# Patient Record
Sex: Female | Born: 1960 | Race: White | Hispanic: No | Smoking: Former smoker
Health system: Southern US, Community
[De-identification: ages and names within clinical notes are randomized; demographics above are authoritative.]

## PROBLEM LIST (undated history)

## (undated) DIAGNOSIS — F1020 Alcohol dependence, uncomplicated: Secondary | ICD-10-CM

## (undated) DIAGNOSIS — R350 Frequency of micturition: Secondary | ICD-10-CM

## (undated) DIAGNOSIS — N200 Calculus of kidney: Secondary | ICD-10-CM

## (undated) DIAGNOSIS — M51369 Other intervertebral disc degeneration, lumbar region without mention of lumbar back pain or lower extremity pain: Secondary | ICD-10-CM

## (undated) DIAGNOSIS — F329 Major depressive disorder, single episode, unspecified: Secondary | ICD-10-CM

## (undated) DIAGNOSIS — F4310 Post-traumatic stress disorder, unspecified: Secondary | ICD-10-CM

## (undated) DIAGNOSIS — R3915 Urgency of urination: Secondary | ICD-10-CM

## (undated) DIAGNOSIS — I10 Essential (primary) hypertension: Secondary | ICD-10-CM

## (undated) DIAGNOSIS — F419 Anxiety disorder, unspecified: Secondary | ICD-10-CM

## (undated) DIAGNOSIS — K219 Gastro-esophageal reflux disease without esophagitis: Secondary | ICD-10-CM

## (undated) DIAGNOSIS — N201 Calculus of ureter: Secondary | ICD-10-CM

## (undated) DIAGNOSIS — R51 Headache: Secondary | ICD-10-CM

## (undated) HISTORY — PX: OTHER SURGICAL HISTORY: SHX169

---

## 1995-12-06 HISTORY — PX: TRANSPHENOIDAL / TRANSNASAL HYPOPHYSECTOMY / RESECTION PITUITARY TUMOR: SUR1382

## 1999-03-12 ENCOUNTER — Other Ambulatory Visit: Admission: RE | Admit: 1999-03-12 | Discharge: 1999-03-12 | Payer: Self-pay | Admitting: Obstetrics and Gynecology

## 2001-02-07 ENCOUNTER — Other Ambulatory Visit: Admission: RE | Admit: 2001-02-07 | Discharge: 2001-02-07 | Payer: Self-pay | Admitting: Obstetrics and Gynecology

## 2001-03-23 ENCOUNTER — Encounter: Payer: Self-pay | Admitting: Obstetrics and Gynecology

## 2001-03-23 ENCOUNTER — Encounter: Admission: RE | Admit: 2001-03-23 | Discharge: 2001-03-23 | Payer: Self-pay | Admitting: Obstetrics and Gynecology

## 2001-03-29 ENCOUNTER — Encounter: Payer: Self-pay | Admitting: Obstetrics and Gynecology

## 2001-03-29 ENCOUNTER — Encounter: Admission: RE | Admit: 2001-03-29 | Discharge: 2001-03-29 | Payer: Self-pay | Admitting: Obstetrics and Gynecology

## 2005-01-22 ENCOUNTER — Emergency Department (HOSPITAL_COMMUNITY): Admission: EM | Admit: 2005-01-22 | Discharge: 2005-01-22 | Payer: Self-pay | Admitting: Emergency Medicine

## 2005-01-31 ENCOUNTER — Encounter: Admission: RE | Admit: 2005-01-31 | Discharge: 2005-01-31 | Payer: Self-pay | Admitting: *Deleted

## 2006-12-01 ENCOUNTER — Ambulatory Visit: Payer: Self-pay | Admitting: Internal Medicine

## 2006-12-01 LAB — CONVERTED CEMR LAB
ALT: 37 units/L (ref 0–40)
AST: 33 units/L (ref 0–37)
Albumin: 4.1 g/dL (ref 3.5–5.2)
Alkaline Phosphatase: 45 units/L (ref 39–117)
BUN: 15 mg/dL (ref 6–23)
Basophils Absolute: 0 10*3/uL (ref 0.0–0.1)
Basophils Relative: 0.4 % (ref 0.0–1.0)
Bilirubin, Direct: 0.1 mg/dL (ref 0.0–0.3)
CO2: 25 meq/L (ref 19–32)
Calcium: 9.5 mg/dL (ref 8.4–10.5)
Chloride: 106 meq/L (ref 96–112)
Creatinine, Ser: 0.9 mg/dL (ref 0.4–1.2)
Eosinophil percent: 1.2 % (ref 0.0–5.0)
Free T4: 0.6 ng/dL (ref 0.6–1.6)
GFR calc non Af Amer: 72 mL/min
Glomerular Filtration Rate, Af Am: 87 mL/min/{1.73_m2}
Glucose, Bld: 93 mg/dL (ref 70–99)
H Pylori IgG: NEGATIVE
HCT: 41.4 % (ref 36.0–46.0)
Hemoglobin: 14.7 g/dL (ref 12.0–15.0)
Lymphocytes Relative: 26.6 % (ref 12.0–46.0)
MCHC: 35.6 g/dL (ref 30.0–36.0)
MCV: 95 fL (ref 78.0–100.0)
Monocytes Absolute: 0.6 10*3/uL (ref 0.2–0.7)
Monocytes Relative: 7.3 % (ref 3.0–11.0)
Neutro Abs: 4.9 10*3/uL (ref 1.4–7.7)
Neutrophils Relative %: 64.5 % (ref 43.0–77.0)
Platelets: 278 10*3/uL (ref 150–400)
Potassium: 4.3 meq/L (ref 3.5–5.1)
RBC: 4.36 M/uL (ref 3.87–5.11)
RDW: 12.1 % (ref 11.5–14.6)
Sodium: 140 meq/L (ref 135–145)
TSH: 1.38 microintl units/mL (ref 0.35–5.50)
Total Bilirubin: 0.7 mg/dL (ref 0.3–1.2)
Total Protein: 6.7 g/dL (ref 6.0–8.3)
WBC: 7.6 10*3/uL (ref 4.5–10.5)

## 2006-12-19 ENCOUNTER — Ambulatory Visit: Payer: Self-pay | Admitting: Internal Medicine

## 2006-12-20 ENCOUNTER — Encounter: Payer: Self-pay | Admitting: Internal Medicine

## 2007-03-16 ENCOUNTER — Encounter: Payer: Self-pay | Admitting: Internal Medicine

## 2007-12-20 ENCOUNTER — Ambulatory Visit: Payer: Self-pay | Admitting: Internal Medicine

## 2007-12-20 DIAGNOSIS — I10 Essential (primary) hypertension: Secondary | ICD-10-CM | POA: Insufficient documentation

## 2007-12-20 DIAGNOSIS — R519 Headache, unspecified: Secondary | ICD-10-CM | POA: Insufficient documentation

## 2007-12-20 DIAGNOSIS — G479 Sleep disorder, unspecified: Secondary | ICD-10-CM | POA: Insufficient documentation

## 2007-12-20 DIAGNOSIS — G43909 Migraine, unspecified, not intractable, without status migrainosus: Secondary | ICD-10-CM | POA: Insufficient documentation

## 2007-12-20 DIAGNOSIS — F411 Generalized anxiety disorder: Secondary | ICD-10-CM | POA: Insufficient documentation

## 2007-12-20 DIAGNOSIS — R51 Headache: Secondary | ICD-10-CM | POA: Insufficient documentation

## 2008-02-12 ENCOUNTER — Encounter: Admission: RE | Admit: 2008-02-12 | Discharge: 2008-02-12 | Payer: Self-pay | Admitting: Obstetrics and Gynecology

## 2008-06-23 ENCOUNTER — Telehealth: Payer: Self-pay | Admitting: Internal Medicine

## 2008-07-15 ENCOUNTER — Encounter: Payer: Self-pay | Admitting: Internal Medicine

## 2008-09-30 ENCOUNTER — Telehealth: Payer: Self-pay | Admitting: Internal Medicine

## 2008-10-14 ENCOUNTER — Telehealth: Payer: Self-pay | Admitting: *Deleted

## 2008-11-03 ENCOUNTER — Ambulatory Visit: Payer: Self-pay | Admitting: Internal Medicine

## 2008-11-03 DIAGNOSIS — K219 Gastro-esophageal reflux disease without esophagitis: Secondary | ICD-10-CM | POA: Insufficient documentation

## 2009-01-16 ENCOUNTER — Encounter: Payer: Self-pay | Admitting: Internal Medicine

## 2009-07-17 ENCOUNTER — Encounter: Payer: Self-pay | Admitting: Internal Medicine

## 2010-12-27 ENCOUNTER — Encounter: Payer: Self-pay | Admitting: Obstetrics and Gynecology

## 2011-01-27 ENCOUNTER — Other Ambulatory Visit: Payer: Self-pay | Admitting: Obstetrics and Gynecology

## 2011-01-27 DIAGNOSIS — Z1231 Encounter for screening mammogram for malignant neoplasm of breast: Secondary | ICD-10-CM

## 2011-02-07 ENCOUNTER — Ambulatory Visit
Admission: RE | Admit: 2011-02-07 | Discharge: 2011-02-07 | Disposition: A | Discharge status: Home / Self Care | Payer: Self-pay | Source: Ambulatory Visit | Attending: Obstetrics and Gynecology | Admitting: Obstetrics and Gynecology

## 2011-02-07 DIAGNOSIS — Z1231 Encounter for screening mammogram for malignant neoplasm of breast: Secondary | ICD-10-CM

## 2011-04-22 NOTE — Assessment & Plan Note (Signed)
[facility] OFFICE NOTE   Coleman, Jill                        MRN:          [id number]  DATE:12/01/2006                            DOB:          [birth year]    CHIEF COMPLAINT:  New patient work in. Fatigue, diarrhea, achiness, and  abdominal tenderness.   HISTORY OF PRESENT ILLNESS:  This is a 50 year old social smoker,  divorced female who comes in today for a first time visit. She has a  number of complaints including bloating abdominally and some achiness.  She has no associated fever or weight loss or blood in her stool. The  diarrhea has really been only over the last day. She currently does not  have a primary care physician, but does have Dr. 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 For those reasons, she is on Toprol XL and Dr.  Meryl Crutch has prescribed Ambien for sleep and __________ for migraine  headaches.   PAST MEDICAL HISTORY:  See database.   PAST SURGICAL HISTORY:  C-section, birth of her first child in 18. 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  She is gravida 2, para 2, last Pap was 10/2005. Last mammogram  was 11/02, L&P 11/03/2005. History of elevated blood pressure readings  with an evaluation by Dr. Abigail Miyamoto a number of years included,  apparently, a 24 hour ambulatory and blood pressure monitoring and renal  Dopplers which showed no causes of secondary hypertension and was felt  to be secondary to anxiety.   SOCIAL HISTORY:  As above, social smoker, social alcohol, 7 hours of  sleep a night, household of 2. 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 She is  divorced and is a single mom.   REVIEW OF SYSTEMS:  Negative for chest pain, shortness of breath,  positive as per HPI, sinus congestion, some  numbness in legs and hands,  history of bloating, abdominal tenderness, heartburn, 1 day of diarrhea  and ongoing fatigue which could be stress, but unsure. She has also has  a history of hot flashes over the last number of weeks.   FAMILY HISTORY:  See database. Apparent heart disease and high blood  pressure. Her mother had gall bladder surgery.   MEDICATIONS:  1. Toprol XL 200 mg.  2. __________  3. Ambien p.r.n.   DRUG ALLERGIES:  None recorded.   OBJECTIVE:  VITAL SIGNS: Height 5 feet 1/2 inches, weight 150 pounds,  temperature 98.2, pulse 66 and regular, blood pressure 120/80.  This is a WDWN, healthy appearing middle-aged lady in no acute distress.  HEENT: Normocephalic, exam grossly normal.  NECK: Supple, without masses, thyromegaly, or bruits.  CHEST: CTA, __________ , equal.  CARDIAC: S1, S2, no gallops or murmurs.  ABDOMEN: Soft, without ascites, no hepatomegaly, guarding or tenderness.  EXTREMITIES: No acute edema.  BACK: Without flank pain.  NEUROLOGIC: Grossly intact without obvious atrophy  or weakness.   IMPRESSION:  Numerous symptoms, probably perimenopausal symptoms,  possibly acute on chronic GI symptoms, question of acute  gastroenteritis. Rule out H. pylori. Multiple other symptoms may be  related to stress and anxiety, but we need to sort this out. Her  headaches, apparently, are stable. We will do labs today including CBC,  H. Pylori, UI, BNP, thyroid test and LFTs. We will try Proton pump  inhibitor and recheck her in 2-3 weeks before she runs out of her  medication and to try to get a better handle on what is going on.     Neta Mends. Fabian Sharp, MD  Electronically Signed    WKP/MedQ  DD: 12/03/2006  DT: 12/04/2006  Job #: [id number]

## 2012-01-25 ENCOUNTER — Other Ambulatory Visit: Payer: Self-pay | Admitting: Obstetrics and Gynecology

## 2012-01-25 DIAGNOSIS — Z1231 Encounter for screening mammogram for malignant neoplasm of breast: Secondary | ICD-10-CM

## 2012-02-16 ENCOUNTER — Ambulatory Visit: Payer: 59

## 2013-05-29 ENCOUNTER — Other Ambulatory Visit: Payer: Self-pay

## 2013-05-29 DIAGNOSIS — Z1231 Encounter for screening mammogram for malignant neoplasm of breast: Secondary | ICD-10-CM

## 2013-06-18 ENCOUNTER — Ambulatory Visit: Payer: 59

## 2013-10-25 ENCOUNTER — Ambulatory Visit (HOSPITAL_COMMUNITY)
Admission: RE | Admit: 2013-10-25 | Discharge: 2013-10-25 | Disposition: A | Discharge status: Home / Self Care | Payer: 59 | Attending: Psychiatry | Admitting: Psychiatry

## 2013-10-25 NOTE — BH Assessment (Signed)
Tele Assessment Note   [name] is an 52 y.o. female. Patient referred by current psychiatrist, Dr. Evelene Croon, for referral to a psychiatrist while Dr. Evelene Croon is on vacation.  Patient states Dr. Evelene Croon to return on December 1st.  Per patient Dr. Carie Caddy office referred pt to [facility] if patient wants to see a psychiatrist for medication evaluation.  Patient on FMLA and says needs proof of treatment for her FMLA claims.  Patient needs to see a psychiatrist for medication evaluation for claims.   Patient states she has a lot of stress at work by her Agricultural consultant. This person has a history of harassing other employees. Patient is on intermittent FMLA to care for her parents. When she returns to work she is subjected to negative comments in front of others about her work and absences by her Agricultural consultant. Patient has filed a complaint with her HR department and is having a meeting today to provide more information regarding her complaint. She says her project leader's behavior increases her stress level.  Patient states multiple problems with her parents over the past year. Father is an alcoholic that has relapsed and has dementia as well as other medical problems. She receives calls at work frequently from one parent or the other. Parents live in [place] and she is back and forth a lot to help manage the situation.  Her other siblings are not helpful and she feels the burden of her parents.   Patient states her work is piling up with her absences and she feels guilty not pulling her weight at work. However, patient states when she goes to work the Chiropodist from her project leader increases her stress and disrupts her productivity.  She complains of decreased concentration and felling overwhelmed.    Patient admits to depressive symptoms and has been tried on several antidepressants. Patient says she experiences too many side effects so she does not stay on any medication. She takes [place] and  klonopin PRN. She sees Dr. Evelene Croon and has been her patient for many years.  Patient says project leader was exceptionally harassing on Tuesday at work and she had a thought she wished she was not alive so as not to deal with him anymore. Patient denies suicidal or homicidal ideation. Denies psychosis.  Patient says she feels safe to return home and is only seeking an appointment with a psychiatrist for her FMLA claims purposes. Patient says she would never commit suicide because of her children and her sense of responsibility. Patient denies need for inpatient hospitalization, just requests an appointment with a psychiatrist.   Reviewed case with Verne Spurr, PA. Patient does not meet criteria for inpatient hospitalization. [facility] does not have any openings with a psychiatrist until January. Gave patient referrals to outpatient psychiatrists. Reviewed suicide prevention information and let patient know she could return at any time if she began to feel worse. Reviewed intensive outpatient services if symptoms worsened. Patient continues to deny need for inpatient or IOP services. Patient denied medical screening exam and signed refusal form. Patient thanked Clinical research associate for referrals and left with a friend.     Axis I: Adjustment Disorder with Depressed Mood Axis II: Deferred Axis III: No past medical history on file. Axis IV: occupational problems and problems with primary support group Axis V: 61-70 mild symptoms  Past Medical History: No past medical history on file.  No past surgical history on file.  Family History: No family history on file.  Social History:  has no tobacco, alcohol, and drug history on file.  Additional Social History:  Alcohol / Drug Use Pain Medications:  (denies ) Prescriptions:  (takes as prescibed) Over the Counter:  (denies) History of alcohol / drug use?: No history of alcohol / drug abuse  CIWA:   COWS:    Allergies: Allergies no  known allergies  Home Medications:  (Not in a hospital admission)  OB/GYN Status:  No LMP recorded.  General Assessment Data Location of Assessment: [facility] Is this a Tele or Face-to-Face Assessment?: Face-to-Face Is this an Initial Assessment or a Re-assessment for this encounter?: Initial Assessment Living Arrangements: Alone Can pt return to current living arrangement?: Yes Admission Status: Voluntary Is patient capable of signing voluntary admission?: Yes Transfer from: Home Referral Source: Psychiatrist  Medical Screening Exam [facility] Walk-in ONLY) Medical Exam completed: No Reason for MSE not completed: Patient Refused  [facility] Crisis Care Plan Living Arrangements: Alone  Education Status Is patient currently in school?: No  Risk to self Suicidal Ideation: No Suicidal Intent: No Is patient at risk for suicide?: No Suicidal Plan?: No Access to Means: No What has been your use of drugs/alcohol within the last 12 months?:  (1-2 glasses of wine, several times per week) Previous Attempts/Gestures: No How many times?: 0 Other Self Harm Risks: denies Triggers for Past Attempts:  (n/a) Intentional Self Injurious Behavior: None Family Suicide History: No Recent stressful life event(s): Other (Comment) (family and work stress) Persecutory voices/beliefs?: No Depression: Yes Depression Symptoms: Insomnia;Tearfulness;Isolating;Fatigue;Guilt;Loss of interest in usual pleasures Substance abuse history and/or treatment for substance abuse?: No Suicide prevention information given to non-admitted patients: Not applicable  Risk to Others Homicidal Ideation: No Thoughts of Harm to Others: No Current Homicidal Intent: No Current Homicidal Plan: No Access to Homicidal Means: No History of harm to others?: No Assessment of Violence: None Noted Does patient have access to weapons?: No Criminal Charges Pending?: No Does patient have a court date:  No  Psychosis Hallucinations: None noted Delusions: None noted  Mental Status Report Appear/Hygiene: Other (Comment) (unremarkable) Eye Contact: Good Motor Activity: Unremarkable Speech: Logical/coherent Level of Consciousness: Alert Mood: Angry Affect: Irritable Anxiety Level: Minimal Thought Processes: Coherent;Relevant Judgement: Unimpaired Orientation: Person;Place;Time;Situation Obsessive Compulsive Thoughts/Behaviors: None  Cognitive Functioning Concentration: Decreased Memory: Recent Intact;Remote Intact IQ: Average Insight: Good Impulse Control: Good Appetite: Fair Weight Loss: 0 Weight Gain:  (yes but unsure how much) Sleep: Increased Total Hours of Sleep: 7 Vegetative Symptoms: Staying in bed  ADLScreening [facility] Assessment Services) Patient's cognitive ability adequate to safely complete daily activities?: Yes Patient able to express need for assistance with ADLs?: Yes Independently performs ADLs?: Yes (appropriate for developmental age)  Prior Inpatient Therapy Prior Inpatient Therapy: No  Prior Outpatient Therapy Prior Outpatient Therapy: Yes Prior Therapy Dates: ongoing Prior Therapy Facilty/Provider(s): Dr. Evelene Croon Reason for Treatment: overwhelmed with work and issues with parents  ADL Screening (condition at time of admission) Patient's cognitive ability adequate to safely complete daily activities?: Yes Is the patient deaf or have difficulty hearing?: No Does the patient have difficulty seeing, even when wearing glasses/contacts?: No Does the patient have difficulty concentrating, remembering, or making decisions?: No Patient able to express need for assistance with ADLs?: Yes Does the patient have difficulty dressing or bathing?: No Independently performs ADLs?: Yes (appropriate for developmental age) Does the patient have difficulty walking or climbing stairs?: No Weakness of Legs: None Weakness of Arms/Hands: None  Home Assistive  Devices/Equipment Home Assistive Devices/Equipment: None  Abuse/Neglect Assessment (Assessment to be complete while patient is alone) Physical Abuse: Denies Verbal Abuse: Denies Sexual Abuse: Denies Exploitation of patient/patient's resources: Denies Self-Neglect: Denies     Merchant navy officer (For Healthcare) Advance Directive: Patient does not have advance directive;Patient would not like information Nutrition Screen- [facility] Adult/WL/AP Patient's home diet: Regular  Additional Information 1:1 In Past 12 Months?: No CIRT Risk: No Elopement Risk: No Does patient have medical clearance?:  (n/a)     Disposition:  Disposition Initial Assessment Completed for this Encounter: Yes Disposition of Patient: Outpatient treatment Type of outpatient treatment: Adult  Yates Decamp 10/25/2013 2:15 PM

## 2014-03-11 ENCOUNTER — Other Ambulatory Visit: Payer: Self-pay | Admitting: Obstetrics and Gynecology

## 2014-03-11 ENCOUNTER — Encounter (HOSPITAL_COMMUNITY): Payer: Self-pay | Admitting: Pharmacist

## 2014-03-12 ENCOUNTER — Encounter (HOSPITAL_COMMUNITY): Payer: Self-pay

## 2014-03-12 ENCOUNTER — Encounter (HOSPITAL_COMMUNITY)
Admission: RE | Admit: 2014-03-12 | Discharge: 2014-03-12 | Disposition: A | Discharge status: Home / Self Care | Payer: 59 | Source: Ambulatory Visit | Attending: Obstetrics and Gynecology | Admitting: Obstetrics and Gynecology

## 2014-03-12 DIAGNOSIS — Z01810 Encounter for preprocedural cardiovascular examination: Secondary | ICD-10-CM | POA: Insufficient documentation

## 2014-03-12 DIAGNOSIS — Z01812 Encounter for preprocedural laboratory examination: Secondary | ICD-10-CM | POA: Insufficient documentation

## 2014-03-12 HISTORY — DX: Essential (primary) hypertension: I10

## 2014-03-12 HISTORY — DX: Anxiety disorder, unspecified: F41.9

## 2014-03-12 HISTORY — DX: Headache: R51

## 2014-03-12 LAB — CBC
HCT: 35.9 % — ABNORMAL LOW (ref 36.0–46.0)
Hemoglobin: 12 g/dL (ref 12.0–15.0)
MCH: 31.3 pg (ref 26.0–34.0)
MCHC: 33.4 g/dL (ref 30.0–36.0)
MCV: 93.7 fL (ref 78.0–100.0)
PLATELETS: 260 10*3/uL (ref 150–400)
RBC: 3.83 MIL/uL — ABNORMAL LOW (ref 3.87–5.11)
RDW: 14.3 % (ref 11.5–15.5)
WBC: 6.6 10*3/uL (ref 4.0–10.5)

## 2014-03-12 LAB — BASIC METABOLIC PANEL
BUN: 10 mg/dL (ref 6–23)
CALCIUM: 9.7 mg/dL (ref 8.4–10.5)
CO2: 25 mEq/L (ref 19–32)
CREATININE: 0.73 mg/dL (ref 0.50–1.10)
Chloride: 99 mEq/L (ref 96–112)
GFR calc non Af Amer: >90 mL/min (ref >90)
Glucose, Bld: 137 mg/dL — ABNORMAL HIGH (ref 70–99)
Potassium: 4 mEq/L (ref 3.7–5.3)
Sodium: 136 mEq/L — ABNORMAL LOW (ref 137–147)

## 2014-03-12 NOTE — Patient Instructions (Signed)
[place]  03/12/2014   Your procedure is scheduled on:  03/19/14  Enter through the Main Entrance of [facility] at [place] up the phone at the desk and dial [phone].   Call this number if you have problems the morning of surgery: [phone]   Remember:   Do not eat food:After Midnight.  Do not drink clear liquids: After Midnight.  Take these medicines the morning of surgery with A SIP OF WATER: NA   Do not wear jewelry, make-up or nail polish.  Do not wear lotions, powders, or perfumes. You may wear deodorant.  Do not shave 48 hours prior to surgery.  Do not bring valuables to the hospital.  [facility] is not   responsible for any belongings or valuables brought to the hospital.  Contacts, dentures or bridgework may not be worn into surgery.  Leave suitcase in the car. After surgery it may be brought to your room.  For patients admitted to the hospital, checkout time is 11:00 AM the day of              discharge.   Patients discharged the day of surgery will not be allowed to drive             home.  Name and phone number of your driver: NA  Special Instructions:      Please read over the following fact sheets that you were given:   Surgical Site Infection Prevention

## 2014-03-18 ENCOUNTER — Encounter (HOSPITAL_COMMUNITY): Payer: Self-pay | Admitting: Anesthesiology

## 2014-03-18 NOTE — H&P (Signed)
[name], [name]                 ACCOUNT NO.:  [id number]  MEDICAL RECORD NO.:  [id number]  LOCATION:  PERIO                         FACILITY:  [facility]  PHYSICIAN:  Lovenia Kim, M.D.DATE OF BIRTH:  1960-12-27  DATE OF ADMISSION:  02/14/2014 DATE OF DISCHARGE:                             HISTORY & PHYSICAL   CHIEF COMPLAINT:  Symptomatic fibroids with dysfunctional uterine bleeding and dysmenorrhea.  HISTORY OF PRESENT ILLNESS:  A 53 year old white female G2, P2, who presents with symptomatic fibroids for surgical therapy.  MEDICATIONS:  To include Vicodin as needed, Klonopin, Relpax, Ambien, and ibuprofen.  PAST SURGICAL HISTORY:  Remarkable for pilonidal cyst removal.  PREGNANCY HISTORY:  Two vaginal deliveries.  FAMILY HISTORY:  Hypertension, heart disease, diabetes.  PHYSICAL EXAMINATION:  GENERAL:  She is a well-developed, well- nourished, white female, in no acute distress.  HEENT:  Normal. NECK:  Supple.  Full range of motion. LUNGS:  Clear. HEART:  Regular rate and rhythm. ABDOMEN:  Soft, nontender. PELVIC:  There is a bulky anteflexed uterus and no adnexal masses. EXTREMITIES:  There are no cords. NEUROLOGIC:  Nonfocal. SKIN:  Intact.  IMPRESSION:  Symptomatic dysmenorrhea and menorrhagia with large submucous uterine fibroids for definitive therapy.  PLAN:  Proceed with da Vinci assisted total laparoscopic hysterectomy, bilateral salpingectomy.  Risks of anesthesia, infection, bleeding, injury to surrounding organs with possible need for repair was discussed.  Delayed versus immediate complications to include bowel and bladder injury noted.  The patient acknowledges and wishes to proceed.     Lovenia Kim, M.D.     RJT/MEDQ  D:  03/18/2014  T:  03/18/2014  Job:  [id number]

## 2014-03-18 NOTE — Anesthesia Preprocedure Evaluation (Addendum)
Anesthesia Evaluation  Patient identified by MRN, date of birth, ID band Patient awake    Reviewed: Allergy & Precautions, H&P , NPO status , Patient's Chart, lab work & pertinent test results, reviewed documented beta blocker date and time   History of Anesthesia Complications Negative for: history of anesthetic complications  Airway Mallampati: II TM Distance: >3 FB Neck ROM: full    Dental  (+) Teeth Intact   Pulmonary Current Smoker,  breath sounds clear to auscultation  Pulmonary exam normal       Cardiovascular Exercise Tolerance: Good hypertension (no meds), Rhythm:regular Rate:Normal     Neuro/Psych  Headaches (5x/month), Anxiety    GI/Hepatic Neg liver ROS, GERD-  Medicated and Controlled,  Endo/Other  Obesity  Renal/GU negative Renal ROS  Female GU complaint (Uterine Fibroids)     Musculoskeletal negative musculoskeletal ROS (+)   Abdominal   Peds  Hematology negative hematology ROS (+)   Anesthesia Other Findings   Reproductive/Obstetrics negative OB ROS                          Anesthesia Physical Anesthesia Plan  ASA: II  Anesthesia Plan: General ETT   Post-op Pain Management:    Induction: Intravenous  Airway Management Planned: Oral ETT  Additional Equipment:   Intra-op Plan:   Post-operative Plan: Extubation in OR  Informed Consent: I have reviewed the patients History and Physical, chart, labs and discussed the procedure including the risks, benefits and alternatives for the proposed anesthesia with the patient or authorized representative who has indicated his/her understanding and acceptance.   Dental advisory given  Plan Discussed with: CRNA, Anesthesiologist and Surgeon  Anesthesia Plan Comments:        Anesthesia Quick Evaluation

## 2014-03-19 ENCOUNTER — Encounter (HOSPITAL_COMMUNITY)
Admission: RE | Disposition: A | Discharge status: Home / Self Care | Payer: Self-pay | Source: Ambulatory Visit | Attending: Obstetrics and Gynecology

## 2014-03-19 ENCOUNTER — Encounter (HOSPITAL_COMMUNITY): Payer: Self-pay | Admitting: Anesthesiology

## 2014-03-19 ENCOUNTER — Ambulatory Visit (HOSPITAL_COMMUNITY)
Admission: RE | Admit: 2014-03-19 | Discharge: 2014-03-20 | Disposition: A | Discharge status: Home / Self Care | Payer: 59 | Source: Ambulatory Visit | Attending: Obstetrics and Gynecology | Admitting: Obstetrics and Gynecology

## 2014-03-19 ENCOUNTER — Encounter (HOSPITAL_COMMUNITY): Payer: 59 | Admitting: Anesthesiology

## 2014-03-19 ENCOUNTER — Ambulatory Visit (HOSPITAL_COMMUNITY): Payer: 59 | Admitting: Anesthesiology

## 2014-03-19 DIAGNOSIS — N946 Dysmenorrhea, unspecified: Secondary | ICD-10-CM | POA: Diagnosis present

## 2014-03-19 DIAGNOSIS — N949 Unspecified condition associated with female genital organs and menstrual cycle: Secondary | ICD-10-CM | POA: Insufficient documentation

## 2014-03-19 DIAGNOSIS — N815 Vaginal enterocele: Secondary | ICD-10-CM | POA: Insufficient documentation

## 2014-03-19 DIAGNOSIS — N920 Excessive and frequent menstruation with regular cycle: Secondary | ICD-10-CM | POA: Insufficient documentation

## 2014-03-19 DIAGNOSIS — D251 Intramural leiomyoma of uterus: Secondary | ICD-10-CM | POA: Insufficient documentation

## 2014-03-19 DIAGNOSIS — F411 Generalized anxiety disorder: Secondary | ICD-10-CM | POA: Insufficient documentation

## 2014-03-19 DIAGNOSIS — N938 Other specified abnormal uterine and vaginal bleeding: Secondary | ICD-10-CM | POA: Insufficient documentation

## 2014-03-19 DIAGNOSIS — N838 Other noninflammatory disorders of ovary, fallopian tube and broad ligament: Secondary | ICD-10-CM | POA: Insufficient documentation

## 2014-03-19 DIAGNOSIS — I10 Essential (primary) hypertension: Secondary | ICD-10-CM | POA: Insufficient documentation

## 2014-03-19 DIAGNOSIS — R51 Headache: Secondary | ICD-10-CM | POA: Insufficient documentation

## 2014-03-19 DIAGNOSIS — F17200 Nicotine dependence, unspecified, uncomplicated: Secondary | ICD-10-CM | POA: Insufficient documentation

## 2014-03-19 DIAGNOSIS — K219 Gastro-esophageal reflux disease without esophagitis: Secondary | ICD-10-CM | POA: Insufficient documentation

## 2014-03-19 HISTORY — PX: BILATERAL SALPINGECTOMY: SHX5743

## 2014-03-19 HISTORY — PX: ROBOTIC ASSISTED TOTAL HYSTERECTOMY: SHX6085

## 2014-03-19 LAB — HCG, SERUM, QUALITATIVE: Preg, Serum: NEGATIVE

## 2014-03-19 SURGERY — ROBOTIC ASSISTED TOTAL HYSTERECTOMY
Anesthesia: General | Site: Abdomen

## 2014-03-19 MED ORDER — ZOLPIDEM TARTRATE 5 MG PO TABS
5.0000 mg | ORAL_TABLET | Freq: Every evening | ORAL | Status: DC | PRN
  Administered 2014-03-19: 5 mg via ORAL
  Filled 2014-03-19: qty 1

## 2014-03-19 MED ORDER — DEXAMETHASONE SODIUM PHOSPHATE 10 MG/ML IJ SOLN
INTRAMUSCULAR | Status: DC | PRN
  Administered 2014-03-19: 10 mg via INTRAVENOUS

## 2014-03-19 MED ORDER — FENTANYL CITRATE 0.05 MG/ML IJ SOLN
INTRAMUSCULAR | Status: AC
  Administered 2014-03-19: 50 ug via INTRAVENOUS
  Filled 2014-03-19: qty 2

## 2014-03-19 MED ORDER — ROPIVACAINE HCL 5 MG/ML IJ SOLN
INTRAMUSCULAR | Status: AC
  Filled 2014-03-19: qty 60

## 2014-03-19 MED ORDER — MENTHOL 3 MG MT LOZG
1.0000 | LOZENGE | OROMUCOSAL | Status: DC | PRN
  Administered 2014-03-19: 3 mg via ORAL
  Filled 2014-03-19: qty 9

## 2014-03-19 MED ORDER — OXYCODONE-ACETAMINOPHEN 5-325 MG PO TABS
1.0000 | ORAL_TABLET | ORAL | Status: DC | PRN
  Administered 2014-03-19 – 2014-03-20 (×6): 1 via ORAL
  Filled 2014-03-19 (×6): qty 1

## 2014-03-19 MED ORDER — MIDAZOLAM HCL 2 MG/2ML IJ SOLN
INTRAMUSCULAR | Status: AC
  Filled 2014-03-19: qty 2

## 2014-03-19 MED ORDER — ARTIFICIAL TEARS OP OINT
TOPICAL_OINTMENT | OPHTHALMIC | Status: DC | PRN
  Administered 2014-03-19: 1 via OPHTHALMIC

## 2014-03-19 MED ORDER — LACTATED RINGERS IV SOLN
INTRAVENOUS | Status: DC
Start: 2014-03-19 — End: 2014-03-19
  Administered 2014-03-19 (×4): via INTRAVENOUS

## 2014-03-19 MED ORDER — PROPOFOL 10 MG/ML IV EMUL
INTRAVENOUS | Status: AC
  Filled 2014-03-19: qty 20

## 2014-03-19 MED ORDER — ROPIVACAINE HCL 5 MG/ML IJ SOLN
INTRAMUSCULAR | Status: AC
  Filled 2014-03-19: qty 30

## 2014-03-19 MED ORDER — KETOROLAC TROMETHAMINE 30 MG/ML IJ SOLN
INTRAMUSCULAR | Status: DC | PRN
  Administered 2014-03-19: 30 mg via INTRAVENOUS

## 2014-03-19 MED ORDER — SODIUM CHLORIDE 0.9 % IJ SOLN
INTRAMUSCULAR | Status: AC
  Filled 2014-03-19: qty 50

## 2014-03-19 MED ORDER — GLYCOPYRROLATE 0.2 MG/ML IJ SOLN
INTRAMUSCULAR | Status: DC | PRN
  Administered 2014-03-19: 0.6 mg via INTRAVENOUS

## 2014-03-19 MED ORDER — ONDANSETRON HCL 4 MG/2ML IJ SOLN
INTRAMUSCULAR | Status: DC | PRN
Start: 2014-03-19 — End: 2014-03-19
  Administered 2014-03-19: 4 mg via INTRAVENOUS

## 2014-03-19 MED ORDER — PROPOFOL 10 MG/ML IV BOLUS
INTRAVENOUS | Status: DC | PRN
  Administered 2014-03-19: 180 mg via INTRAVENOUS
  Administered 2014-03-19: 50 mg via INTRAVENOUS
  Administered 2014-03-19: 20 mg via INTRAVENOUS
  Administered 2014-03-19: 50 mg via INTRAVENOUS

## 2014-03-19 MED ORDER — SCOPOLAMINE 1 MG/3DAYS TD PT72
MEDICATED_PATCH | TRANSDERMAL | Status: DC
Start: 2014-03-19 — End: 2014-03-20
  Administered 2014-03-19: 1.5 mg via TRANSDERMAL
  Filled 2014-03-19: qty 1

## 2014-03-19 MED ORDER — NEOSTIGMINE METHYLSULFATE 1 MG/ML IJ SOLN
INTRAMUSCULAR | Status: DC | PRN
  Administered 2014-03-19: 3 mg via INTRAVENOUS

## 2014-03-19 MED ORDER — SUFENTANIL CITRATE 50 MCG/ML IV SOLN
INTRAVENOUS | Status: DC | PRN
Start: 2014-03-19 — End: 2014-03-19
  Administered 2014-03-19 (×3): 10 ug via INTRAVENOUS
  Administered 2014-03-19: 20 ug via INTRAVENOUS

## 2014-03-19 MED ORDER — DEXTROSE IN LACTATED RINGERS 5 % IV SOLN
INTRAVENOUS | Status: DC
Start: 2014-03-19 — End: 2014-03-20

## 2014-03-19 MED ORDER — SODIUM CHLORIDE 0.9 % IJ SOLN
INTRAMUSCULAR | Status: AC
  Filled 2014-03-19: qty 10

## 2014-03-19 MED ORDER — TRAMADOL HCL 50 MG PO TABS
50.0000 mg | ORAL_TABLET | Freq: Four times a day (QID) | ORAL | Status: DC | PRN
  Administered 2014-03-20: 50 mg via ORAL
  Filled 2014-03-19: qty 1

## 2014-03-19 MED ORDER — LIDOCAINE HCL (CARDIAC) 20 MG/ML IV SOLN
INTRAVENOUS | Status: AC
  Filled 2014-03-19: qty 5

## 2014-03-19 MED ORDER — CEFAZOLIN SODIUM-DEXTROSE 2-3 GM-% IV SOLR
INTRAVENOUS | Status: AC
  Filled 2014-03-19: qty 50

## 2014-03-19 MED ORDER — DEXAMETHASONE SODIUM PHOSPHATE 10 MG/ML IJ SOLN
INTRAMUSCULAR | Status: AC
  Filled 2014-03-19: qty 1

## 2014-03-19 MED ORDER — NEOSTIGMINE METHYLSULFATE 1 MG/ML IJ SOLN
INTRAMUSCULAR | Status: AC
  Filled 2014-03-19: qty 1

## 2014-03-19 MED ORDER — ROPIVACAINE HCL 5 MG/ML IJ SOLN
INTRAMUSCULAR | Status: DC | PRN
  Administered 2014-03-19: 60 mL

## 2014-03-19 MED ORDER — LIDOCAINE HCL (CARDIAC) 20 MG/ML IV SOLN
INTRAVENOUS | Status: DC | PRN
  Administered 2014-03-19: 60 mg via INTRAVENOUS

## 2014-03-19 MED ORDER — LACTATED RINGERS IR SOLN
Status: DC | PRN
  Administered 2014-03-19: 3000 mL

## 2014-03-19 MED ORDER — ONDANSETRON HCL 4 MG/2ML IJ SOLN
INTRAMUSCULAR | Status: AC
  Filled 2014-03-19: qty 2

## 2014-03-19 MED ORDER — SUFENTANIL CITRATE 50 MCG/ML IV SOLN
INTRAVENOUS | Status: AC
  Filled 2014-03-19: qty 1

## 2014-03-19 MED ORDER — SCOPOLAMINE 1 MG/3DAYS TD PT72
1.0000 | MEDICATED_PATCH | TRANSDERMAL | Status: DC
Start: 2014-03-19 — End: 2014-03-19
  Administered 2014-03-19: 1.5 mg via TRANSDERMAL

## 2014-03-19 MED ORDER — CEFAZOLIN SODIUM-DEXTROSE 2-3 GM-% IV SOLR
2.0000 g | INTRAVENOUS | Status: AC
  Administered 2014-03-19: 2 g via INTRAVENOUS

## 2014-03-19 MED ORDER — MEPERIDINE HCL 25 MG/ML IJ SOLN
INTRAMUSCULAR | Status: AC
  Administered 2014-03-19: 12.5 mg via INTRAVENOUS
  Filled 2014-03-19: qty 1

## 2014-03-19 MED ORDER — ROCURONIUM BROMIDE 100 MG/10ML IV SOLN
INTRAVENOUS | Status: DC | PRN
  Administered 2014-03-19: 50 mg via INTRAVENOUS
  Administered 2014-03-19: 10 mg via INTRAVENOUS

## 2014-03-19 MED ORDER — KETOROLAC TROMETHAMINE 30 MG/ML IJ SOLN
INTRAMUSCULAR | Status: AC
  Filled 2014-03-19: qty 1

## 2014-03-19 MED ORDER — GLYCOPYRROLATE 0.2 MG/ML IJ SOLN
INTRAMUSCULAR | Status: AC
  Filled 2014-03-19: qty 4

## 2014-03-19 MED ORDER — METOCLOPRAMIDE HCL 5 MG/ML IJ SOLN: 10.0000 mg | Freq: Once | INTRAMUSCULAR | Status: DC | PRN

## 2014-03-19 MED ORDER — MEPERIDINE HCL 25 MG/ML IJ SOLN
6.2500 mg | INTRAMUSCULAR | Status: DC | PRN
  Administered 2014-03-19: 12.5 mg via INTRAVENOUS

## 2014-03-19 MED ORDER — MIDAZOLAM HCL 5 MG/5ML IJ SOLN
INTRAMUSCULAR | Status: DC | PRN
  Administered 2014-03-19: 2 mg via INTRAVENOUS

## 2014-03-19 MED ORDER — FENTANYL CITRATE 0.05 MG/ML IJ SOLN
25.0000 ug | INTRAMUSCULAR | Status: DC | PRN
  Administered 2014-03-19 (×4): 50 ug via INTRAVENOUS

## 2014-03-19 SURGICAL SUPPLY — 59 items
BAG URINE DRAINAGE (UROLOGICAL SUPPLIES) ×3 IMPLANT
BARRIER ADHS 3X4 INTERCEED (GAUZE/BANDAGES/DRESSINGS) ×3 IMPLANT
CATH FOLEY 3WAY  5CC 16FR (CATHETERS) ×1
CATH FOLEY 3WAY 5CC 16FR (CATHETERS) ×2 IMPLANT
CHLORAPREP W/TINT 26ML (MISCELLANEOUS) ×3 IMPLANT
CLOTH BEACON ORANGE TIMEOUT ST (SAFETY) ×3 IMPLANT
CONT PATH 16OZ SNAP LID 3702 (MISCELLANEOUS) ×3 IMPLANT
COVER MAYO STAND STRL (DRAPES) ×3 IMPLANT
COVER TABLE BACK 60X90 (DRAPES) ×6 IMPLANT
COVER TIP SHEARS 8 DVNC (MISCELLANEOUS) ×2 IMPLANT
COVER TIP SHEARS 8MM DA VINCI (MISCELLANEOUS) ×1
DECANTER SPIKE VIAL GLASS SM (MISCELLANEOUS) ×12 IMPLANT
DERMABOND ADVANCED (GAUZE/BANDAGES/DRESSINGS) ×1
DERMABOND ADVANCED .7 DNX12 (GAUZE/BANDAGES/DRESSINGS) ×2 IMPLANT
DRAPE HUG U DISPOSABLE (DRAPE) ×3 IMPLANT
DRAPE LG THREE QUARTER DISP (DRAPES) ×6 IMPLANT
DRAPE WARM FLUID 44X44 (DRAPE) ×3 IMPLANT
ELECT REM PT RETURN 9FT ADLT (ELECTROSURGICAL) ×3
ELECTRODE REM PT RTRN 9FT ADLT (ELECTROSURGICAL) ×2 IMPLANT
EVACUATOR SMOKE 8.L (FILTER) ×3 IMPLANT
GAUZE VASELINE 3X9 (GAUZE/BANDAGES/DRESSINGS) IMPLANT
GLOVE BIO SURGEON STRL SZ7.5 (GLOVE) ×6 IMPLANT
GOWN STRL REUS W/TWL LRG LVL3 (GOWN DISPOSABLE) ×18 IMPLANT
GYRUS RUMI II 2.5CM BLUE (DISPOSABLE)
GYRUS RUMI II 3.5CM BLUE (DISPOSABLE)
GYRUS RUMI II 4.0CM BLUE (DISPOSABLE)
KIT ACCESSORY DA VINCI DISP (KITS) ×1
KIT ACCESSORY DVNC DISP (KITS) ×2 IMPLANT
LEGGING LITHOTOMY PAIR STRL (DRAPES) ×3 IMPLANT
NEEDLE INSUFFLATION 150MM (ENDOMECHANICALS) ×3 IMPLANT
PACK LAVH (CUSTOM PROCEDURE TRAY) ×3 IMPLANT
PAD PREP 24X48 CUFFED NSTRL (MISCELLANEOUS) ×6 IMPLANT
PLUG CATH AND CAP STER (CATHETERS) ×3 IMPLANT
PROTECTOR NERVE ULNAR (MISCELLANEOUS) ×6 IMPLANT
RUMI II 3.0CM BLUE KOH-EFFICIE (DISPOSABLE) ×3 IMPLANT
RUMI II GYRUS 2.5CM BLUE (DISPOSABLE) IMPLANT
RUMI II GYRUS 3.5CM BLUE (DISPOSABLE) IMPLANT
RUMI II GYRUS 4.0CM BLUE (DISPOSABLE) IMPLANT
SET CYSTO W/LG BORE CLAMP LF (SET/KITS/TRAYS/PACK) IMPLANT
SET IRRIG TUBING LAPAROSCOPIC (IRRIGATION / IRRIGATOR) ×3 IMPLANT
SOLUTION ELECTROLUBE (MISCELLANEOUS) ×3 IMPLANT
SUT VIC AB 0 CT1 27 (SUTURE) ×2
SUT VIC AB 0 CT1 27XBRD ANBCTR (SUTURE) ×4 IMPLANT
SUT VICRYL 0 UR6 27IN ABS (SUTURE) ×3 IMPLANT
SUT VICRYL RAPIDE 4/0 PS 2 (SUTURE) ×6 IMPLANT
SUT VLOC 180 0 9IN  GS21 (SUTURE) ×1
SUT VLOC 180 0 9IN GS21 (SUTURE) ×2 IMPLANT
SYR 50ML LL SCALE MARK (SYRINGE) ×3 IMPLANT
SYRINGE 10CC LL (SYRINGE) ×3 IMPLANT
TIP UTERINE 6.7X10CM GRN DISP (MISCELLANEOUS) ×3 IMPLANT
TOWEL OR 17X24 6PK STRL BLUE (TOWEL DISPOSABLE) ×9 IMPLANT
TROCAR DISP BLADELESS 8 DVNC (TROCAR) ×2 IMPLANT
TROCAR DISP BLADELESS 8MM (TROCAR) ×1
TROCAR XCEL 12X100 BLDLESS (ENDOMECHANICALS) ×3 IMPLANT
TROCAR XCEL NON-BLD 5MMX100MML (ENDOMECHANICALS) ×3 IMPLANT
TROCAR Z-THREAD 12X150 (TROCAR) ×3 IMPLANT
TUBING FILTER THERMOFLATOR (ELECTROSURGICAL) ×3 IMPLANT
WARMER LAPAROSCOPE (MISCELLANEOUS) ×3 IMPLANT
WATER STERILE IRR 1000ML POUR (IV SOLUTION) ×9 IMPLANT

## 2014-03-19 NOTE — Progress Notes (Signed)
Patient ID: CITLALLY CAPTAIN, female   DOB: [birth year], 53 y.o.   MRN: [id number] Patient seen and examined. Consent witnessed and signed. No changes noted. Update completed. CBC    Component Value Date/Time   WBC 6.6 03/12/2014 1405   RBC 3.83* 03/12/2014 1405   HGB 12.0 03/12/2014 1405   HCT 35.9* 03/12/2014 1405   PLT 260 03/12/2014 1405   MCV 93.7 03/12/2014 1405   MCH 31.3 03/12/2014 1405   MCHC 33.4 03/12/2014 1405   RDW 14.3 03/12/2014 1405   MONOABS 0.6 12/01/2006 1633   BASOSABS 0.0 12/01/2006 1633

## 2014-03-19 NOTE — Anesthesia Postprocedure Evaluation (Signed)
  Anesthesia Post Note  Patient: [name]  Procedure(s) Performed: Procedure(s) (LRB): ROBOTIC ASSISTED TOTAL HYSTERECTOMY (N/A) BILATERAL SALPINGECTOMY (Bilateral)  Anesthesia type: GA  Patient location: PACU  Post pain: Pain level controlled  Post assessment: Post-op Vital signs reviewed  Last Vitals:  Filed Vitals:   03/19/14 1200  BP: 122/78  Pulse: 97  Temp:   Resp: 23    Post vital signs: Reviewed  Level of consciousness: sedated  Complications: No apparent anesthesia complications

## 2014-03-19 NOTE — Addendum Note (Signed)
Addendum created 03/19/14 1707 by Asher Muir, CRNA   Modules edited: Notes Section   Notes Section:  File: [id number]

## 2014-03-19 NOTE — Transfer of Care (Signed)
Immediate Anesthesia Transfer of Care Note  Patient: [name]  Procedure(s) Performed: Procedure(s): ROBOTIC ASSISTED TOTAL HYSTERECTOMY (N/A) BILATERAL SALPINGECTOMY (Bilateral)  Patient Location: PACU  Anesthesia Type:General  Level of Consciousness: sedated  Airway & Oxygen Therapy: Patient Spontanous Breathing and Patient connected to nasal cannula oxygen  Post-op Assessment: Report given to PACU RN and Post -op Vital signs reviewed and stable  Post vital signs: stable  Complications: No apparent anesthesia complications

## 2014-03-19 NOTE — Op Note (Signed)
03/19/2014  11:09 AM  PATIENT:  [name]  53 y.o. female  PRE-OPERATIVE DIAGNOSIS:  Fibroids Pelvic pain  POST-OPERATIVE DIAGNOSIS:  Fibroids Pelvic adhesions enterocele  PROCEDURE:  Procedure(s): ROBOTIC ASSISTED TOTAL HYSTERECTOMY BILATERAL SALPINGECTOMY  SURGEON:  Surgeon(s): Lovenia Kim, MD  ASSISTANTS: Drake Leach, CNM   ANESTHESIA:   local and general  ESTIMATED BLOOD LOSS: 50cc  DRAINS: Urinary Catheter (Foley)   LOCAL MEDICATIONS USED:  XYLOCAINE  and Amount: 50 ml  SPECIMEN:  Source of Specimen:  uterus , cervix, fibroid, tubes  DISPOSITION OF SPECIMEN:  PATHOLOGY  COUNTS:  YES  DICTATION #: [id number]  PLAN OF CARE: DC later today  PATIENT DISPOSITION:  PACU - hemodynamically stable.

## 2014-03-19 NOTE — Anesthesia Postprocedure Evaluation (Signed)
Anesthesia Post Note  Patient: [name]  Procedure(s) Performed: Procedure(s) (LRB): ROBOTIC ASSISTED TOTAL HYSTERECTOMY (N/A) BILATERAL SALPINGECTOMY (Bilateral)  Anesthesia type: General  Patient location: Women's Unit  Post pain: Pain level controlled  Post assessment: Post-op Vital signs reviewed  Last Vitals:  Filed Vitals:   03/19/14 1415  BP: 136/83  Pulse: 97  Temp: 36.7 C  Resp: 16    Post vital signs: Reviewed  Level of consciousness: sedated  Complications: No apparent anesthesia complications

## 2014-03-20 ENCOUNTER — Encounter (HOSPITAL_COMMUNITY): Payer: Self-pay | Admitting: Obstetrics and Gynecology

## 2014-03-20 LAB — CBC
HCT: 30.7 % — ABNORMAL LOW (ref 36.0–46.0)
Hemoglobin: 9.9 g/dL — ABNORMAL LOW (ref 12.0–15.0)
MCH: 29.9 pg (ref 26.0–34.0)
MCHC: 32.2 g/dL (ref 30.0–36.0)
MCV: 92.7 fL (ref 78.0–100.0)
Platelets: 264 10*3/uL (ref 150–400)
RBC: 3.31 MIL/uL — AB (ref 3.87–5.11)
RDW: 13.9 % (ref 11.5–15.5)
WBC: 11.4 10*3/uL — AB (ref 4.0–10.5)

## 2014-03-20 LAB — BASIC METABOLIC PANEL
BUN: 9 mg/dL (ref 6–23)
CHLORIDE: 101 meq/L (ref 96–112)
CO2: 26 mEq/L (ref 19–32)
Calcium: 8.9 mg/dL (ref 8.4–10.5)
Creatinine, Ser: 0.73 mg/dL (ref 0.50–1.10)
GFR calc non Af Amer: >90 mL/min (ref >90)
GLUCOSE: 119 mg/dL — AB (ref 70–99)
POTASSIUM: 4.1 meq/L (ref 3.7–5.3)
Sodium: 138 mEq/L (ref 137–147)

## 2014-03-20 MED ORDER — OXYCODONE-ACETAMINOPHEN 5-325 MG PO TABS: 1.0000 | ORAL_TABLET | ORAL | Status: DC | PRN

## 2014-03-20 NOTE — Op Note (Signed)
[name], [name]                 ACCOUNT NO.:  [id number]  MEDICAL RECORD NO.:  [id number]  LOCATION:  9309                          FACILITY:  [facility]  PHYSICIAN:  Lovenia Kim, M.D.DATE OF BIRTH:  [birth year]  DATE OF PROCEDURE: DATE OF DISCHARGE:                              OPERATIVE REPORT   PREOPERATIVE DIAGNOSES:  Symptomatic fibroids, pelvic pain.  POSTOPERATIVE DIAGNOSES:  Symptomatic fibroids, pelvic pain plus pelvic adhesions plus enterocele.  PROCEDURE:  Da Vinci assisted total laparoscopic hysterectomy, bilateral salpingectomy, lysis of adhesions, McCall culdoplasty.  SURGEON:  Lovenia Kim, M.D.  ASSISTANTDrake Leach, CNM.  ANESTHESIA:  General.  ESTIMATED BLOOD LOSS:  50 mL.  COMPLICATIONS:  None.  URINE OUTPUT:  100 mL.  COUNTS:  Correct.  The patient to recovery in good condition.  BRIEF OPERATIVE NOTE:  After being apprised of risks of anesthesia, infection, bleeding, injury to surrounding organs, possible need for repair, delayed versus immediate complications to include bowel and bladder injury, possible need for repair, patient was brought to the operating room where she was administered a general anesthetic without complications.  Prepped and draped in usual sterile fashion.  Foley catheter placed after achieving adequate anesthesia.  Feet were placed in [place].  Foley catheter placed.  The cervix was grasped with a single-tooth tenaculum, and RUMI retractor was placed without difficulty after palpating this fibroid which appears to be in the right lower uterine segment to endocervix level.  At this time, the RUMI retractor was placed without difficulty and attention turned to the abdominal portion procedure whereby an umbilical incision was made with a scalpel.  Veress needle placed opening pressure of 2, 3.5 L were insufflated without difficulty.  Atraumatic trocar entries noted. Pictures taken at this time.  Visualization  reveals a bulky enlarged uterus, bilateral normal ovaries, bilateral normal tubes, anterior cul- de-sac adhesions due to previous C-section and bowel adhesions obscuring the left adnexa.  Liver, gallbladder, diaphragm, and appendix all appeared normal.  At this time, 2 robotic ports were placed on the right, 1 on the left with an assistant port on the left in a standard fashion.  The patient has been established in deep Trendelenburg position and the robot was docked on the right side docking method in the standard fashion.  At this time, adhesions on the left adnexa are sharply lysed exposing an otherwise normal left adnexa.  The left mesosalpinx was divided with detaching the left tube.  The retroperitoneal space was entered.  The ureter was identified on the medial leaf of the peritoneum and dissected and swept off the medial leaf of the peritoneum in a caudad fashion.  The tubo-ovarian ligament was then skeletonized, divided after being cauterized using bipolar cautery.  The round ligament was opened.  The uterine vessels on the left were skeletonized.  The bladder flap was developed sharply.  At this time, the uterine vessels on the left were cauterized in a 3-point method but not divided.  On the right side, there is distortion due to an endocervical fibroid.  The right retroperitoneal space was entered after the right mesosalpinx was cauterized and divided detaching the right tube.  The ureter  was identified peristalsing along the medial leaf of the peritoneum and it was pushed in a caudad fashion off the medial leaf of the peritoneum.  The right uterine vessels were skeletonized, cauterized, and divided.  The round ligament is previously divided as well and the bladder flap was further developed.  At this time, the occluder balloon is insufflated vaginally and the cup was identified along the left portion of the specimen, along the right as attempts were made to dissect the RUMI  cup.  The right fibroid bulges into the field obscuring good visualization, so the RUMI cup was then exposed at about 300 degrees and then dissected sharply.  This fibroid is pushed medially and the cervicovaginal junction was better identified and the specimen was completely detached, retracted into the vagina, removed, weight of the specimen 159 g.  At this time, urine output continues to be good.  Urine is clear.  Ureters identified peristalsing bilaterally.  The vaginal cuff was then closed using a 0 V-Loc suture in a continuous running fashion.  A second imbricating layer was placed, and McCall culdoplasty suture was placed.  The ureters appear normal bilaterally.  Good hemostasis was achieved.  Irrigation was accomplished.  Urine is clear and copious at this time.  The CO2 was released and good hemostasis was assured.  All robotic arms were then removed under direct visualization and the robot was undocked.  CO2 was released.  Positive pressure was applied.  Incisions were closed using 0 Vicryl, 4-0 Vicryl, and Dermabond.  Vaginal exam reveals the vaginal cuff to be well approximated.  The patient tolerated the procedure well, was awakened and transferred to recovery in good condition.     Lovenia Kim, M.D.     RJT/MEDQ  D:  03/19/2014  T:  03/19/2014  Job:  [id number]

## 2014-03-20 NOTE — Progress Notes (Signed)
Pt d/c teaching complete  Has been medicated  By RN

## 2014-03-20 NOTE — Progress Notes (Signed)
1 Day Post-Op Procedure(s) (LRB): ROBOTIC ASSISTED TOTAL HYSTERECTOMY (N/A) BILATERAL SALPINGECTOMY (Bilateral)  Subjective: Patient reports nausea, incisional pain, tolerating PO, + flatus and no problems voiding.    Objective: I have reviewed patient's vital signs, intake and output, medications and labs. Patient Vitals for the past 24 hrs:  BP Temp Temp src Pulse Resp SpO2  03/20/14 0527 131/74 mmHg 97.9 F (36.6 C) Oral 88 18 98 %  03/20/14 0130 132/79 mmHg 98.1 F (36.7 C) Oral 90 18 97 %  03/19/14 2122 134/81 mmHg 98.2 F (36.8 C) Oral 91 18 96 %  03/19/14 1850 125/72 mmHg 98.2 F (36.8 C) Oral 98 18 100 %  03/19/14 1415 136/83 mmHg 98 F (36.7 C) Oral 97 16 100 %  03/19/14 1316 129/73 mmHg 98.2 F (36.8 C) - 100 16 100 %  03/19/14 1245 117/76 mmHg - - 96 14 100 %  03/19/14 1230 129/98 mmHg 98 F (36.7 C) - 102 25 100 %  03/19/14 1215 118/79 mmHg - - 93 16 95 %  03/19/14 1200 122/78 mmHg - - 97 23 99 %  03/19/14 1155 - - - 93 12 98 %  03/19/14 1145 143/89 mmHg - - 99 14 100 %  03/19/14 1130 130/81 mmHg - - 109 16 100 %  03/19/14 1120 140/82 mmHg 98.4 F (36.9 C) - 125 13 98 %   CBC    Component Value Date/Time   WBC 11.4* 03/20/2014 0535   RBC 3.31* 03/20/2014 0535   HGB 9.9* 03/20/2014 0535   HCT 30.7* 03/20/2014 0535   PLT 264 03/20/2014 0535   MCV 92.7 03/20/2014 0535   MCH 29.9 03/20/2014 0535   MCHC 32.2 03/20/2014 0535   RDW 13.9 03/20/2014 0535   MONOABS 0.6 12/01/2006 1633   BASOSABS 0.0 12/01/2006 1633      General: alert, cooperative and appears stated age Resp: clear to auscultation bilaterally and normal percussion bilaterally Cardio: regular rate and rhythm, S1, S2 normal, no murmur, click, rub or gallop and normal apical impulse GI: soft, non-tender; bowel sounds normal; no masses,  no organomegaly and incision: clean, dry and intact Extremities: extremities normal, atraumatic, no cyanosis or edema and Homans sign is negative, no sign of DVT Vaginal  Bleeding: minimal  Assessment: s/p Procedure(s): ROBOTIC ASSISTED TOTAL HYSTERECTOMY (N/A) BILATERAL SALPINGECTOMY (Bilateral): stable, progressing well and tolerating diet  Plan: Advance diet Encourage ambulation Advance to PO medication Discontinue IV fluids Discharge home  LOS: 1 day    Lovenia Kim 03/20/2014, 7:12 AM

## 2014-04-16 ENCOUNTER — Ambulatory Visit
Admission: RE | Admit: 2014-04-16 | Discharge: 2014-04-16 | Disposition: A | Discharge status: Home / Self Care | Payer: 59 | Source: Ambulatory Visit

## 2014-04-16 DIAGNOSIS — Z1231 Encounter for screening mammogram for malignant neoplasm of breast: Secondary | ICD-10-CM

## 2014-05-23 ENCOUNTER — Emergency Department (HOSPITAL_COMMUNITY)
Admission: EM | Admit: 2014-05-23 | Discharge: 2014-05-23 | Disposition: A | Discharge status: Other Acute Inpatient Hospital | Payer: 59 | Attending: Emergency Medicine | Admitting: Emergency Medicine

## 2014-05-23 ENCOUNTER — Encounter (HOSPITAL_COMMUNITY): Payer: Self-pay | Admitting: Emergency Medicine

## 2014-05-23 ENCOUNTER — Inpatient Hospital Stay (HOSPITAL_COMMUNITY): Admission: EM | Admit: 2014-05-23 | Payer: 59 | Source: Intra-hospital | Admitting: Psychiatry

## 2014-05-23 DIAGNOSIS — R45851 Suicidal ideations: Secondary | ICD-10-CM

## 2014-05-23 DIAGNOSIS — Z79899 Other long term (current) drug therapy: Secondary | ICD-10-CM | POA: Insufficient documentation

## 2014-05-23 DIAGNOSIS — F32A Depression, unspecified: Secondary | ICD-10-CM

## 2014-05-23 DIAGNOSIS — Z3202 Encounter for pregnancy test, result negative: Secondary | ICD-10-CM | POA: Insufficient documentation

## 2014-05-23 DIAGNOSIS — F411 Generalized anxiety disorder: Secondary | ICD-10-CM | POA: Insufficient documentation

## 2014-05-23 DIAGNOSIS — I10 Essential (primary) hypertension: Secondary | ICD-10-CM | POA: Insufficient documentation

## 2014-05-23 DIAGNOSIS — F3289 Other specified depressive episodes: Secondary | ICD-10-CM | POA: Insufficient documentation

## 2014-05-23 DIAGNOSIS — F329 Major depressive disorder, single episode, unspecified: Secondary | ICD-10-CM | POA: Insufficient documentation

## 2014-05-23 DIAGNOSIS — F17200 Nicotine dependence, unspecified, uncomplicated: Secondary | ICD-10-CM | POA: Insufficient documentation

## 2014-05-23 LAB — COMPREHENSIVE METABOLIC PANEL
ALK PHOS: 76 U/L (ref 39–117)
ALT: 63 U/L — ABNORMAL HIGH (ref 0–35)
AST: 55 U/L — ABNORMAL HIGH (ref 0–37)
Albumin: 3.6 g/dL (ref 3.5–5.2)
BUN: 13 mg/dL (ref 6–23)
CO2: 23 mEq/L (ref 19–32)
Calcium: 9.9 mg/dL (ref 8.4–10.5)
Chloride: 101 mEq/L (ref 96–112)
Creatinine, Ser: 0.69 mg/dL (ref 0.50–1.10)
GLUCOSE: 145 mg/dL — AB (ref 70–99)
POTASSIUM: 4.2 meq/L (ref 3.7–5.3)
Sodium: 140 mEq/L (ref 137–147)
TOTAL PROTEIN: 7.9 g/dL (ref 6.0–8.3)

## 2014-05-23 LAB — ACETAMINOPHEN LEVEL: Acetaminophen (Tylenol), Serum: <15 ug/mL (ref 10–30)

## 2014-05-23 LAB — CBC
HCT: 41.5 % (ref 36.0–46.0)
HEMOGLOBIN: 13.3 g/dL (ref 12.0–15.0)
MCH: 26.8 pg (ref 26.0–34.0)
MCHC: 32 g/dL (ref 30.0–36.0)
MCV: 83.5 fL (ref 78.0–100.0)
Platelets: 298 10*3/uL (ref 150–400)
RBC: 4.97 MIL/uL (ref 3.87–5.11)
RDW: 16.9 % — ABNORMAL HIGH (ref 11.5–15.5)
WBC: 7.9 10*3/uL (ref 4.0–10.5)

## 2014-05-23 LAB — RAPID URINE DRUG SCREEN, HOSP PERFORMED
Amphetamines: NOT DETECTED
BARBITURATES: NOT DETECTED
Benzodiazepines: POSITIVE — AB
Cocaine: NOT DETECTED
OPIATES: NOT DETECTED
TETRAHYDROCANNABINOL: NOT DETECTED

## 2014-05-23 LAB — POC URINE PREG, ED: Preg Test, Ur: NEGATIVE

## 2014-05-23 LAB — SALICYLATE LEVEL: Salicylate Lvl: <2 mg/dL — ABNORMAL LOW (ref 2.8–20.0)

## 2014-05-23 LAB — ETHANOL: Alcohol, Ethyl (B): 282 mg/dL — ABNORMAL HIGH (ref 0–11)

## 2014-05-23 MED ORDER — METOPROLOL SUCCINATE ER 50 MG PO TB24
50.0000 mg | ORAL_TABLET | Freq: Every day | ORAL | Status: DC
  Filled 2014-05-23: qty 1

## 2014-05-23 MED ORDER — ESTRADIOL 0.05 MG/24HR TD PTWK: 0.0500 mg | MEDICATED_PATCH | TRANSDERMAL | Status: DC

## 2014-05-23 MED ORDER — ACETAMINOPHEN 325 MG PO TABS
650.0000 mg | ORAL_TABLET | ORAL | Status: DC | PRN
  Administered 2014-05-23: 650 mg via ORAL
  Filled 2014-05-23: qty 2

## 2014-05-23 MED ORDER — IBUPROFEN 200 MG PO TABS: 600.0000 mg | ORAL_TABLET | Freq: Three times a day (TID) | ORAL | Status: DC | PRN

## 2014-05-23 MED ORDER — ALUM & MAG HYDROXIDE-SIMETH 200-200-20 MG/5ML PO SUSP: 30.0000 mL | ORAL | Status: DC | PRN

## 2014-05-23 MED ORDER — ZOLPIDEM TARTRATE 5 MG PO TABS: 5.0000 mg | ORAL_TABLET | Freq: Every evening | ORAL | Status: DC | PRN

## 2014-05-23 MED ORDER — LORAZEPAM 1 MG PO TABS
2.0000 mg | ORAL_TABLET | Freq: Once | ORAL | Status: AC
  Administered 2014-05-23: 2 mg via ORAL
  Filled 2014-05-23: qty 2

## 2014-05-23 MED ORDER — LORAZEPAM 1 MG PO TABS: 1.0000 mg | ORAL_TABLET | Freq: Three times a day (TID) | ORAL | Status: DC | PRN

## 2014-05-23 MED ORDER — ONDANSETRON HCL 4 MG PO TABS: 4.0000 mg | ORAL_TABLET | Freq: Three times a day (TID) | ORAL | Status: DC | PRN

## 2014-05-23 MED ORDER — ESTRADIOL 0.05 MG/24HR TD PTWK
0.0500 mg | MEDICATED_PATCH | TRANSDERMAL | Status: DC
  Administered 2014-05-23: 0.05 mg via TRANSDERMAL
  Filled 2014-05-23: qty 1

## 2014-05-23 NOTE — ED Notes (Signed)
Pt transferred from triage, presents with SI, plan to hang self or inhale carbon dioxide.  Pt's son is in [place] and pt is post Hysterectomy March 19, 2014.  Pt reports she is under a lot of stress at work, they are unhappy with her performance.  Pt reports she feels isolated.  Denies HI or AV hallucinations.  Pt reports she follows up with Dr Toy Care for the past 10 years, currently being treated for Depression.  Pt is intoxicated, cooperative, anxious, tearful.

## 2014-05-23 NOTE — ED Notes (Signed)
Pt wanded by security. 

## 2014-05-23 NOTE — ED Provider Notes (Signed)
CSN: [id number]     Arrival date & time 05/23/14  0249 History   First MD Initiated Contact with Patient 05/23/14 [phone]     Chief Complaint  Patient presents with  . Suicidal     (Consider location/radiation/quality/duration/timing/severity/associated sxs/prior Treatment) HPI 53 year old female presents to emergency department via police after calling [phone] concern for her safety.  Patient reports long history of depression.  She had a hysterectomy done about 8 weeks ago, and reports after coming back to work after the surgery, she has had increasing stressors, she feels that staff members are out to get her period in order to cope with the stress at work, and her ongoing health issues, she has been medicating with alcohol.  She reports she's missed the last 3 days of work as she does not feel safe at work.  Tonight she was encouraged by one of her friends to call [phone] and she was contemplating suicide.  Patient has plans to either higher or cells or use carbon monoxide from her car.  Patient reports she frequently has thoughts of suicide.  She's never followed through with her plans before, and she has 2 children she is now willing to leave him without a mother.  Patient has a psychiatrist that she sees and has for some time.  She reports that she is estranged from her parents who live in [place], and feels abandoned by them.  Patient reports that she feels hopeless and feels that she would be better off dead. Past Medical History  Diagnosis Date  . Anxiety   . Headache(784.0)   . Hypertension    Past Surgical History  Procedure Laterality Date  . Cesarean section    . 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 Robotic assisted total hysterectomy N/A 03/19/2014    Procedure: ROBOTIC ASSISTED TOTAL HYSTERECTOMY;  Surgeon: Lovenia Kim, MD;  Location: [facility];  Service: Gynecology;  Laterality: N/A;  . Bilateral salpingectomy Bilateral 03/19/2014    Procedure: BILATERAL  SALPINGECTOMY;  Surgeon: Lovenia Kim, MD;  Location: [facility];  Service: Gynecology;  Laterality: Bilateral;   History reviewed. No pertinent family history. History  Substance Use Topics  . Smoking status: Light Tobacco Smoker  . Smokeless tobacco: Not on file  . Alcohol Use: Yes     Comment: socially   OB History   Grav Para Term Preterm Abortions TAB SAB Ect Mult Living                 Review of Systems  See History of Present Illness; otherwise all other systems are reviewed and negative   Allergies  Review of patient's allergies indicates no known allergies.  Home Medications   Prior to Admission medications   Medication Sig Start Date End Date Taking? Authorizing Provider  clonazePAM (KLONOPIN) 0.5 MG tablet Take 0.5 mg by mouth 2 (two) times daily as needed for anxiety.    Historical Provider, MD  eletriptan (RELPAX) 40 MG tablet Take 40 mg by mouth 2 (two) times daily as needed for migraine or headache. One tablet by mouth at onset of headache. May repeat in 2 hours if headache persists or recurs.    Historical Provider, MD  metoprolol succinate (TOPROL-XL) 50 MG 24 hr tablet Take 1 tablet by mouth daily. 04/24/14   Historical Provider, MD  MINIVELLE 0.05 MG/24HR patch Place 1 patch onto the skin 2 (two) times a week. 04/24/14   Historical Provider, MD  oxyCODONE-acetaminophen (PERCOCET/ROXICET) 5-325 MG per tablet Take 1-2 tablets by mouth every 4 (four) hours as needed for severe pain. 03/20/14   Lovenia Kim, MD  traMADol (ULTRAM) 50 MG tablet Take 50 mg by mouth 2 (two) times daily.    Historical Provider, MD  zolpidem (AMBIEN) 5 MG tablet Take 5 mg by mouth at bedtime as needed for sleep.    Historical Provider, MD   BP 142/94  Pulse 94  Temp(Src) 98.7 F (37.1 C) (Oral)  Resp 18  SpO2 95%  LMP 02/09/2014 Physical Exam  Nursing note and vitals reviewed. Constitutional: She is oriented to person, place, and time. She appears well-developed and  well-nourished.  HENT:  Head: Normocephalic and atraumatic.  Nose: Nose normal.  Mouth/Throat: Oropharynx is clear and moist.  Eyes: Conjunctivae and EOM are normal. Pupils are equal, round, and reactive to light.  Neck: Normal range of motion. Neck supple. No JVD present. No tracheal deviation present. No thyromegaly present.  Cardiovascular: Normal rate, regular rhythm, normal heart sounds and intact distal pulses.  Exam reveals no gallop and no friction rub.   No murmur heard. Pulmonary/Chest: Effort normal and breath sounds normal. No stridor. No respiratory distress. She has no wheezes. She has no rales. She exhibits no tenderness.  Abdominal: Soft. Bowel sounds are normal. She exhibits no distension and no mass. There is no tenderness. There is no rebound and no guarding.  Musculoskeletal: Normal range of motion. She exhibits no edema and no tenderness.  Lymphadenopathy:    She has no cervical adenopathy.  Neurological: She is alert and oriented to person, place, and time. She exhibits normal muscle tone. Coordination normal.  Skin: Skin is dry. No rash noted. No erythema. No pallor.  Psychiatric:  Patient is tearful, flat affect, reports depressed mood and suicidal ideation.  Patient is asking for help, but at the same time does not wish to stay as she does not want to be labeled as crazy.  She has poor insight and judgment into her condition.    ED Course  Procedures (including critical care time) Labs Review Labs Reviewed  CBC - Abnormal; Notable for the following:    RDW 16.9 (*)    All other components within normal limits  COMPREHENSIVE METABOLIC PANEL - Abnormal; Notable for the following:    Glucose, Bld 145 (*)    AST 55 (*)    ALT 63 (*)    Total Bilirubin <0.2 (*)    All other components within normal limits  ETHANOL - Abnormal; Notable for the following:    Alcohol, Ethyl (B) 282 (*)    All other components within normal limits  SALICYLATE LEVEL - Abnormal;  Notable for the following:    Salicylate Lvl <8.2 (*)    All other components within normal limits  ACETAMINOPHEN LEVEL  URINE RAPID DRUG SCREEN (HOSP PERFORMED)  POC URINE PREG, ED    Imaging Review No results found.   EKG Interpretation None      MDM   Final diagnoses:  Depression  Suicidal ideation    53 year old female with severe depression and suicidal ideation.  We'll start workup.  4:06 AM IVC paperwork has been completed, but not yet faxed to the magistrate, patient reports that she is voluntary at this time.  However if patient changes her mind, she will need paperwork completed as I feel that she has significant risk for discharge home.  Kalman Drape, MD 05/23/14 [phone]

## 2014-05-23 NOTE — ED Notes (Signed)
Report to [place] RN in secure area-administered Ativan 2 mg po per order-pt tearful and angry-states "I want chocolate milk", "I pay for this shit".  Explained there is no chocolate milk in the hospital-pt given a cup of water.

## 2014-05-23 NOTE — Progress Notes (Addendum)
TTS went to complete support paperwork with patient, however patient had been discharged home. Per discussion with RN patient denied SI/HI/AH/VH. Per RN pt picked up by a work friend. CSW attempted to reach patient on home/cell number and left 2 messages. CSW also called friend listed in chart, and left message.   Noreene Larsson [phone]  ED CSW 05/23/2014 936am   CSW discussed further with providers. CSW called communications for a welfare check on patient.   Noreene Larsson [phone]  ED CSW 05/23/2014 1000am

## 2014-05-23 NOTE — ED Notes (Signed)
Pt transported to secure area for further evaluation

## 2014-05-23 NOTE — ED Notes (Addendum)
Pt states she called the police because she felt like hurting herself. Pt states her son is in [place], her job is not happy with her job performance, and had a hysterectomy 2 weeks ago. Pt appears teary and states she has hx of depression. Pt is alert, oriented and ambulatory. Pt states she has been drinking vodka tonight. Pt is SI. Pt states she planned on hanging herself or sitting in her car inhaling carbon monoxide.

## 2014-05-23 NOTE — Consult Note (Signed)
  [name] was inadvertently discharged home instead of transferred to [facility].  Mental status by the discharging nurse indicated she was not suicidal and was comfortable with going home.  We followed up with a police officer going to her home and he reported she was in good spirits and happy.  When I talked with her on the phone she was happy and said she was better off at home.  She was pleased that she got to go home and not to inpatient  She is calling Dr Toy Care ( her psychiatrist) after hanging up with me. [name] was able to contract for safety with the police officer with her and with me over the phone.

## 2014-05-23 NOTE — ED Notes (Signed)
Pt belongings include samsung galaxy with charger, black purse (sherpani), pt states she has 3 main credit cards of value in purse [employer] (blue), state employees (black and silver), [employer] (blue simplicity), drivers license, good year auto cards x2, [employer] card, [employer] card, [employer] card. Pt has a pair of black and white nike flip flops, navy blue printed stretch pants, white and black underwear, black shirt, silver house key.

## 2014-05-23 NOTE — BH Assessment (Signed)
Tele Assessment Note   [name] is a 53 y.o. female who voluntarily presents to [facility] for SI/depression.  Pt called the police because she felt like hurting herself and told this writer that she attempted SI by carbon monoxide poisoning--"it didn't work, I started thinking about my kids".  Pt admits she tried to harm herself by the same method approx 10 yrs ago but has no inpt admission for that attempt.  Pt reports stressors attributing to current mental state: (1) pt.'s employers are not happy with her job performance; (2) she had a hysterectomy 2 weeks ago and (3) her son is in [place].  Pt is no longer endorsing SI, stating--"I turned the car on and left it running and I thought about my kids".  Pt says she will be resigning from her current position on 05/26/14 because she can longer work there of her increased stress level.  She admits she drinks 1 bottle of wine every 2-3 days, her last drink was 05/23/14 and she drank 1 bottle of wine.  Pt c/o depressive sxs: crying spells and panic attacks.    Axis I: Anxiety Disorder NOS and Major Depression, single episode Axis II: Deferred Axis III:  Past Medical History  Diagnosis Date  . Anxiety   . Headache(784.0)   . Hypertension   . Depression    Axis IV: occupational problems, other psychosocial or environmental problems, problems related to social environment and problems with primary support group Axis V: 31-40 impairment in reality testing  Past Medical History:  Past Medical History  Diagnosis Date  . Anxiety   . Headache(784.0)   . Hypertension   . Depression     Past Surgical History  Procedure Laterality Date  . Cesarean section    . 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 Robotic assisted total hysterectomy N/A 03/19/2014    Procedure: ROBOTIC ASSISTED TOTAL HYSTERECTOMY;  Surgeon: Lovenia Kim, MD;  Location: [facility];  Service: Gynecology;  Laterality: N/A;  . Bilateral salpingectomy Bilateral  03/19/2014    Procedure: BILATERAL SALPINGECTOMY;  Surgeon: Lovenia Kim, MD;  Location: [facility];  Service: Gynecology;  Laterality: Bilateral;    Family History: History reviewed. No pertinent family history.  Social History:  reports that she has been smoking.  She does not have any smokeless tobacco history on file. She reports that she drinks alcohol. She reports that she does not use illicit drugs.  Additional Social History:  Alcohol / Drug Use Pain Medications: See MAR  Prescriptions: See MAR  Over the Counter: See MAR  History of alcohol / drug use?: Yes Longest period of sobriety (when/how long): None  Negative Consequences of Use: Work / School;Personal relationships;Financial Withdrawal Symptoms: Other (Comment) (No current w/d sxs ) Substance #1 Name of Substance 1: Alcohol  1 - Age of First Use: 20's  1 - Amount (size/oz): 1 Bottle  1 - Frequency: Every 2-3 days  1 - Duration: On-going  1 - Last Use / Amount: 05/23/14  CIWA: CIWA-Ar BP: 130/80 mmHg Pulse Rate: 84 COWS:    Allergies: No Known Allergies  Home Medications:  (Not in a hospital admission)  OB/GYN Status:  Patient's last menstrual period was 02/09/2014.  General Assessment Data Location of Assessment: [facility] Is this a Tele or Face-to-Face Assessment?: Tele Assessment Is this an Initial Assessment or a Re-assessment for this encounter?: Initial Assessment Living Arrangements: Alone Can pt return to current living arrangement?:  No Admission Status: Voluntary Is patient capable of signing voluntary admission?: Yes Transfer from: [facility] Referral Source: MD  Medical Screening Exam ([place]) Medical Exam completed: No Reason for MSE not completed: Other: (None )  [facility] Crisis Care Plan Living Arrangements: Alone Name of Psychiatrist: Dr. Marquis Lunch  Name of Therapist: None   Education Status Is patient currently in school?: No Current Grade: None  Highest grade of school  patient has completed: None  Name of school: None  Contact person: None   Risk to self Suicidal Ideation: No-Not Currently/Within Last 6 Months Suicidal Intent: No-Not Currently/Within Last 6 Months Is patient at risk for suicide?: Yes Suicidal Plan?: No-Not Currently/Within Last 6 Months Access to Means: Yes Specify Access to Suicidal Means: [place], vehicles  What has been your use of drugs/alcohol within the last 12 months?: Pt admits drinking 1 bottle of wine every 2-3 days  Previous Attempts/Gestures: Yes How many times?: 1 Other Self Harm Risks: None  Triggers for Past Attempts: Unpredictable Intentional Self Injurious Behavior: None Family Suicide History: No Recent stressful life event(s): Other (Comment) (Work-realted stress, health, son in [place] ) Persecutory voices/beliefs?: No Depression: Yes Depression Symptoms: Loss of interest in usual pleasures;Isolating;Tearfulness;Insomnia Substance abuse history and/or treatment for substance abuse?: Yes Suicide prevention information given to non-admitted patients: Not applicable  Risk to Others Homicidal Ideation: No Thoughts of Harm to Others: No Current Homicidal Intent: No Current Homicidal Plan: No Access to Homicidal Means: No Identified Victim: None  History of harm to others?: No Assessment of Violence: None Noted Violent Behavior Description: None  Does patient have access to weapons?: No Criminal Charges Pending?: No Does patient have a court date: No  Psychosis Hallucinations: None noted Delusions: None noted  Mental Status Report Appear/Hygiene: In scrubs Eye Contact: Fair Motor Activity: Unremarkable Speech: Logical/coherent;Pressured Level of Consciousness: Alert Mood: Other (Comment) (Appropriate ) Affect: Appropriate to circumstance Anxiety Level: Minimal Thought Processes: Coherent;Relevant Judgement: Unimpaired Orientation: Person;Place;Time;Situation Obsessive Compulsive Thoughts/Behaviors:  None  Cognitive Functioning Concentration: Normal Memory: Recent Intact;Remote Intact IQ: Average Insight: Fair Impulse Control: Poor Appetite: Good Weight Loss: 0 Weight Gain: 0 Sleep: Decreased Total Hours of Sleep: 6 Vegetative Symptoms: None  ADLScreening [facility] Assessment Services) Patient's cognitive ability adequate to safely complete daily activities?: Yes Patient able to express need for assistance with ADLs?: Yes Independently performs ADLs?: Yes (appropriate for developmental age)  Prior Inpatient Therapy Prior Inpatient Therapy: No Prior Therapy Dates: None Prior Therapy Facilty/Provider(s): None  Reason for Treatment: None   Prior Outpatient Therapy Prior Outpatient Therapy: Yes Prior Therapy Dates: Current  Prior Therapy Facilty/Provider(s): [facility]  Reason for Treatment: Med Mgt   ADL Screening (condition at time of admission) Patient's cognitive ability adequate to safely complete daily activities?: Yes Is the patient deaf or have difficulty hearing?: No Does the patient have difficulty seeing, even when wearing glasses/contacts?: No Does the patient have difficulty concentrating, remembering, or making decisions?: No Patient able to express need for assistance with ADLs?: Yes Does the patient have difficulty dressing or bathing?: No Independently performs ADLs?: Yes (appropriate for developmental age) Does the patient have difficulty walking or climbing stairs?: No Weakness of Legs: None Weakness of Arms/Hands: None  Home Assistive Devices/Equipment Home Assistive Devices/Equipment: None  Therapy Consults (therapy consults require a physician order) PT Evaluation Needed: No OT Evalulation Needed: No SLP Evaluation Needed: No Abuse/Neglect Assessment (Assessment to be complete while patient is alone) Physical Abuse: Denies Verbal Abuse: Denies Sexual Abuse: Denies Exploitation  of patient/patient's resources: Denies Self-Neglect:  Denies Values / Beliefs Cultural Requests During Hospitalization: None Spiritual Requests During Hospitalization: None Consults Spiritual Care Consult Needed: No Social Work Consult Needed: No Regulatory affairs officer (For Healthcare) Advance Directive: Patient does not have advance directive;Patient would not like information Pre-existing out of facility DNR order (yellow form or pink MOST form): No Nutrition Screen- [facility] Adult/[facility] Patient's home diet: Regular  Additional Information 1:1 In Past 12 Months?: No CIRT Risk: No Elopement Risk: No Does patient have medical clearance?: Yes     Disposition:  Disposition Initial Assessment Completed for this Encounter: Yes Disposition of Patient: Inpatient treatment program;Referred to (Accepted by Serena Colonel, NP; [phone]) Type of inpatient treatment program: Adult Patient referred to: Other (Comment) (Accepted by Serena Colonel, NP; [phone])  [place] Rm 05/23/2014 7:25 AM

## 2014-05-23 NOTE — ED Notes (Signed)
Valuables placed in security safe

## 2014-08-13 ENCOUNTER — Encounter (HOSPITAL_COMMUNITY): Payer: Self-pay | Admitting: Emergency Medicine

## 2014-08-13 ENCOUNTER — Emergency Department (HOSPITAL_COMMUNITY)
Admission: EM | Admit: 2014-08-13 | Discharge: 2014-08-14 | Disposition: A | Discharge status: Home / Self Care | Payer: 59 | Attending: Emergency Medicine | Admitting: Emergency Medicine

## 2014-08-13 DIAGNOSIS — F3289 Other specified depressive episodes: Secondary | ICD-10-CM | POA: Insufficient documentation

## 2014-08-13 DIAGNOSIS — F1010 Alcohol abuse, uncomplicated: Secondary | ICD-10-CM

## 2014-08-13 DIAGNOSIS — F329 Major depressive disorder, single episode, unspecified: Secondary | ICD-10-CM | POA: Diagnosis not present

## 2014-08-13 DIAGNOSIS — F17200 Nicotine dependence, unspecified, uncomplicated: Secondary | ICD-10-CM | POA: Insufficient documentation

## 2014-08-13 DIAGNOSIS — I10 Essential (primary) hypertension: Secondary | ICD-10-CM

## 2014-08-13 DIAGNOSIS — N946 Dysmenorrhea, unspecified: Secondary | ICD-10-CM

## 2014-08-13 DIAGNOSIS — F32A Depression, unspecified: Secondary | ICD-10-CM

## 2014-08-13 DIAGNOSIS — F411 Generalized anxiety disorder: Secondary | ICD-10-CM | POA: Insufficient documentation

## 2014-08-13 DIAGNOSIS — Z008 Encounter for other general examination: Secondary | ICD-10-CM | POA: Diagnosis present

## 2014-08-13 DIAGNOSIS — Z79899 Other long term (current) drug therapy: Secondary | ICD-10-CM | POA: Diagnosis not present

## 2014-08-13 DIAGNOSIS — R51 Headache: Secondary | ICD-10-CM

## 2014-08-13 DIAGNOSIS — G479 Sleep disorder, unspecified: Secondary | ICD-10-CM

## 2014-08-13 DIAGNOSIS — G43909 Migraine, unspecified, not intractable, without status migrainosus: Secondary | ICD-10-CM

## 2014-08-13 LAB — I-STAT CHEM 8, ED
BUN: 9 mg/dL (ref 6–23)
CALCIUM ION: 1.16 mmol/L (ref 1.12–1.23)
CHLORIDE: 108 meq/L (ref 96–112)
CREATININE: 0.7 mg/dL (ref 0.50–1.10)
Glucose, Bld: 108 mg/dL — ABNORMAL HIGH (ref 70–99)
HCT: 48 % — ABNORMAL HIGH (ref 36.0–46.0)
Hemoglobin: 16.3 g/dL — ABNORMAL HIGH (ref 12.0–15.0)
POTASSIUM: 4.1 meq/L (ref 3.7–5.3)
Sodium: 139 mEq/L (ref 137–147)
TCO2: 24 mmol/L (ref 0–100)

## 2014-08-13 LAB — ETHANOL: ALCOHOL ETHYL (B): 14 mg/dL — AB (ref 0–11)

## 2014-08-13 MED ORDER — LORAZEPAM 1 MG PO TABS: 0.0000 mg | ORAL_TABLET | Freq: Four times a day (QID) | ORAL | Status: DC

## 2014-08-13 MED ORDER — ESTRADIOL 0.05 MG/24HR TD PTWK: 0.0500 mg | MEDICATED_PATCH | TRANSDERMAL | Status: DC

## 2014-08-13 MED ORDER — VITAMIN B-1 100 MG PO TABS
100.0000 mg | ORAL_TABLET | Freq: Every day | ORAL | Status: DC
  Administered 2014-08-14: 100 mg via ORAL
  Filled 2014-08-13: qty 1

## 2014-08-13 MED ORDER — THIAMINE HCL 100 MG/ML IJ SOLN: 100.0000 mg | Freq: Every day | INTRAMUSCULAR | Status: DC

## 2014-08-13 MED ORDER — ALUM & MAG HYDROXIDE-SIMETH 200-200-20 MG/5ML PO SUSP
30.0000 mL | ORAL | Status: DC | PRN
  Administered 2014-08-13: 30 mL via ORAL
  Filled 2014-08-13: qty 30

## 2014-08-13 MED ORDER — ACETAMINOPHEN 325 MG PO TABS: 650.0000 mg | ORAL_TABLET | ORAL | Status: DC | PRN

## 2014-08-13 MED ORDER — ONDANSETRON HCL 4 MG PO TABS: 4.0000 mg | ORAL_TABLET | Freq: Three times a day (TID) | ORAL | Status: DC | PRN

## 2014-08-13 MED ORDER — LORAZEPAM 1 MG PO TABS
1.0000 mg | ORAL_TABLET | Freq: Three times a day (TID) | ORAL | Status: DC | PRN
  Administered 2014-08-13: 1 mg via ORAL
  Filled 2014-08-13: qty 1

## 2014-08-13 MED ORDER — CLONAZEPAM 0.5 MG PO TABS
0.5000 mg | ORAL_TABLET | Freq: Two times a day (BID) | ORAL | Status: DC | PRN
  Administered 2014-08-14: 0.5 mg via ORAL
  Filled 2014-08-13: qty 1

## 2014-08-13 MED ORDER — NICOTINE 21 MG/24HR TD PT24: 21.0000 mg | MEDICATED_PATCH | Freq: Every day | TRANSDERMAL | Status: DC | PRN

## 2014-08-13 MED ORDER — LORAZEPAM 1 MG PO TABS: 0.0000 mg | ORAL_TABLET | Freq: Two times a day (BID) | ORAL | Status: DC

## 2014-08-13 MED ORDER — ESCITALOPRAM OXALATE 10 MG PO TABS
20.0000 mg | ORAL_TABLET | Freq: Every day | ORAL | Status: DC
  Administered 2014-08-13 – 2014-08-14 (×2): 20 mg via ORAL
  Filled 2014-08-13 (×2): qty 2

## 2014-08-13 MED ORDER — ZOLPIDEM TARTRATE 5 MG PO TABS
5.0000 mg | ORAL_TABLET | Freq: Every day | ORAL | Status: DC
  Administered 2014-08-13: 5 mg via ORAL
  Filled 2014-08-13: qty 1

## 2014-08-13 MED ORDER — IBUPROFEN 200 MG PO TABS
400.0000 mg | ORAL_TABLET | Freq: Three times a day (TID) | ORAL | Status: DC | PRN
  Administered 2014-08-14: 400 mg via ORAL
  Filled 2014-08-13: qty 2

## 2014-08-13 NOTE — ED Notes (Signed)
Per pt, states she was drinking last night and called her son and cussed him out-he took papers out on her-they picked her up at psychiatrists office-states she has been out of work and drinks occasionally-was talking to psyche about outpatient treatment when they came and served papers

## 2014-08-13 NOTE — ED Notes (Signed)
Pt and belongings have been wanded, handed to s.h. Staff.

## 2014-08-13 NOTE — ED Provider Notes (Addendum)
CSN: [id number]     Arrival date & time 08/13/14  1620 History   First MD Initiated Contact with Patient 08/13/14 1629     Chief Complaint  Patient presents with  . IVC       HPI Pt was seen at 1630. Per Police, IVC paperwork and pt, c/o gradual onset and persistence of constant depression and anxiety for the past several weeks. Pt states she has been out of work "for a while" and "gets drinking." States she was drinking last night and called his son and daughter in-laws-mother "and cussed them out because he works so hard and his wife is lazy and it's her mother's fault for not raising her right." Pt does endorse she "told him I wanted to kill myself last night," but denies SI today. States she was at her psychiatrist's office today arranging outpatient treatment when the Police arrived to serve her IVC papers taken out by her son. Denies SI/SA, no HI, no hallucinations.     Past Medical History  Diagnosis Date  . Anxiety   . Headache(784.0)   . Hypertension   . Depression    Past Surgical History  Procedure Laterality Date  . Cesarean section    . 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 Robotic assisted total hysterectomy N/A 03/19/2014    Procedure: ROBOTIC ASSISTED TOTAL HYSTERECTOMY;  Surgeon: Lovenia Kim, MD;  Location: [facility];  Service: Gynecology;  Laterality: N/A;  . Bilateral salpingectomy Bilateral 03/19/2014    Procedure: BILATERAL SALPINGECTOMY;  Surgeon: Lovenia Kim, MD;  Location: [facility];  Service: Gynecology;  Laterality: Bilateral;    History  Substance Use Topics  . Smoking status: Light Tobacco Smoker  . Smokeless tobacco: Not on file  . Alcohol Use: Yes     Comment: socially    Review of Systems ROS: Statement: All systems negative except as marked or noted in the HPI; Constitutional: Negative for fever and chills. ; ; Eyes: Negative for eye pain, redness and discharge. ; ; ENMT: Negative for ear pain, hoarseness, nasal  congestion, sinus pressure and sore throat. ; ; Cardiovascular: Negative for chest pain, palpitations, diaphoresis, dyspnea and peripheral edema. ; ; Respiratory: Negative for cough, wheezing and stridor. ; ; Gastrointestinal: Negative for nausea, vomiting, diarrhea, abdominal pain, blood in stool, hematemesis, jaundice and rectal bleeding. . ; ; Genitourinary: Negative for dysuria, flank pain and hematuria. ; ; Musculoskeletal: Negative for back pain and neck pain. Negative for swelling and trauma.; ; Skin: Negative for pruritus, rash, abrasions, blisters, bruising and skin lesion.; ; Neuro: Negative for headache, lightheadedness and neck stiffness. Negative for weakness, altered level of consciousness , altered mental status, extremity weakness, paresthesias, involuntary movement, seizure and syncope.; Psych:  No SI, no SA, no HI, no hallucinations.   Allergies  Review of patient's allergies indicates no known allergies.  Home Medications   Prior to Admission medications   Medication Sig Start Date End Date Taking? Authorizing Provider  clonazePAM (KLONOPIN) 0.5 MG tablet Take 0.5 mg by mouth 2 (two) times daily as needed for anxiety.   Yes Historical Provider, MD  eletriptan (RELPAX) 40 MG tablet Take 20 mg by mouth 2 (two) times daily as needed for migraine or headache. One tablet by mouth at onset of headache. May repeat in 2 hours if headache persists or recurs.   Yes Historical Provider, MD  escitalopram (LEXAPRO) 20 MG tablet Take 20 mg by mouth daily.  Yes Historical Provider, MD  MINIVELLE 0.05 MG/24HR patch Place 1 patch onto the skin 2 (two) times a week. Change on Thursdays and Sundays 04/24/14  Yes Historical Provider, MD  zolpidem (AMBIEN) 5 MG tablet Take 5 mg by mouth at bedtime.    Yes Historical Provider, MD   BP 165/115  Pulse 77  Temp(Src) 98.2 F (36.8 C) (Oral)  Resp 18  SpO2 95%  LMP 02/09/2014 Physical Exam 1635: Physical examination:  Nursing notes reviewed; Vital  signs and O2 SAT reviewed;  Constitutional: Well developed, Well nourished, Well hydrated, In no acute distress; Head:  Normocephalic, atraumatic; Eyes: EOMI, PERRL, No scleral icterus; ENMT: Mouth and pharynx normal, Mucous membranes moist; Neck: Supple, Full range of motion, No lymphadenopathy; Cardiovascular: Regular rate and rhythm, No murmur, rub, or gallop; Respiratory: Breath sounds clear & equal bilaterally, No rales, rhonchi, wheezes.  Speaking full sentences with ease, Normal respiratory effort/excursion; Chest: Nontender, Movement normal; Abdomen: Soft, Nontender, Nondistended, Normal bowel sounds; Genitourinary: No CVA tenderness; Extremities: Pulses normal, No tenderness, No edema, No calf edema or asymmetry.; Neuro: AA&Ox3, Major CN grossly intact.  Speech clear. No gross focal motor or sensory deficits in extremities. Climbs on and off stretcher easily by herself. Gait steady.; Skin: Color normal, Warm, Dry.; Psych:  Tearful at times. Denies SI, no HI, no hallucinations.    ED Course  Procedures     EKG Interpretation None      MDM  MDM Reviewed: previous chart, nursing note and vitals Reviewed previous: labs Interpretation: labs   Results for orders placed during the hospital encounter of 08/13/14  ETHANOL      Result Value Ref Range   Alcohol, Ethyl (B) 14 (*) 0 - 11 mg/dL  I-STAT CHEM 8, ED      Result Value Ref Range   Sodium 139  137 - 147 mEq/L   Potassium 4.1  3.7 - 5.3 mEq/L   Chloride 108  96 - 112 mEq/L   BUN 9  6 - 23 mg/dL   Creatinine, Ser 0.70  0.50 - 1.10 mg/dL   Glucose, Bld 108 (*) 70 - 99 mg/dL   Calcium, Ion 1.16  1.12 - 1.23 mmol/L   TCO2 24  0 - 100 mmol/L   Hemoglobin 16.3 (*) 12.0 - 15.0 g/dL   HCT 48.0 (*) 36.0 - 46.0 %    1930:  TTS evaluation pending. Holding orders written.       Francine Graven, DO 08/13/14 2314

## 2014-08-13 NOTE — ED Notes (Signed)
Patient on Hallway during this assessment. Mood and affects flat and depressed. She complaint feeling anxious and having Heartburn. Patient denied SI/HI and denied Hallucinations. Writer encouraged and supported patient. Q 15 minute check continues as ordered to maintain safety.

## 2014-08-13 NOTE — ED Notes (Signed)
Pharmacy called to reconcile meds.  Informed patient.

## 2014-08-13 NOTE — ED Notes (Signed)
Patient is irritable and defensive.  Demanding medication and states "I can't sleep without my ambien,earplugs and sound machine."  IVC paperwork explained. Support offered.

## 2014-08-13 NOTE — BH Assessment (Signed)
Tele Assessment Note   [name] is a 53 y.o. female who presents via IVC petition, initiated by her son.  Pt admits she was drinking last night and called her son and daughter-in-law and had an argument with them.  Pt says the argument was about his wife being "unemployed and lazy".  Pt says she had an appointment with her Dr. Toy Care and was picked up by police while at the psych's office because son "took papers out" on her.  Pt denies SI/HI/AVH--"i just want to go the beach, I'm leaving in the morning", but states she did tell her son yesterday that she wanted to kill herself.  Pt reports a previous SI by CO2 poisoning in 2014.  She has no past inpt admissions.  Per son's petition, he stated that pt expressed SI thoughts at least 5x's but recanted statement when she was approached by the police.  Pt denies HI/AVH and says she only drinks on the weekends--"6 mixed drinks of vodka and lemonade", son's statement says she drinks until she is intoxicated and takes prescription meds and intends to drive to harm self.  Pt is stressed because of:(1)  family conflicts with son and his spouse, (2) parents declining health, (3) lost job 4 mos ago and (4) financial problems.    Axis I: Alcohol Abuse and Major Depression, single episode Axis II: Deferred Axis III:  Past Medical History  Diagnosis Date  . Anxiety   . Headache(784.0)   . Hypertension   . Depression    Axis IV: economic problems, occupational problems, other psychosocial or environmental problems, problems related to social environment and problems with primary support group Axis V: 41-50 serious symptoms  Past Medical History:  Past Medical History  Diagnosis Date  . Anxiety   . Headache(784.0)   . Hypertension   . Depression     Past Surgical History  Procedure Laterality Date  . Cesarean section    . 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 Robotic assisted total hysterectomy N/A 03/19/2014    Procedure:  ROBOTIC ASSISTED TOTAL HYSTERECTOMY;  Surgeon: Lovenia Kim, MD;  Location: [facility];  Service: Gynecology;  Laterality: N/A;  . Bilateral salpingectomy Bilateral 03/19/2014    Procedure: BILATERAL SALPINGECTOMY;  Surgeon: Lovenia Kim, MD;  Location: [facility];  Service: Gynecology;  Laterality: Bilateral;    Family History: No family history on file.  Social History:  reports that she has been smoking.  She does not have any smokeless tobacco history on file. She reports that she drinks alcohol. She reports that she does not use illicit drugs.  Additional Social History:  Alcohol / Drug Use Pain Medications: See MAR  Prescriptions: See MAR  Over the Counter: See MAR  History of alcohol / drug use?: Yes Longest period of sobriety (when/how long): None  Negative Consequences of Use: Work / School;Personal relationships;Financial Withdrawal Symptoms: Other (Comment) (No current w/d sxs) Substance #1 Name of Substance 1: Alcohol  1 - Age of First Use: Unk  1 - Amount (size/oz): 6 mixed drinks--Vodka and Lemonade  1 - Frequency: "Wknds Only"  1 - Duration: On-going  1 - Last Use / Amount: 08/13/14  CIWA: CIWA-Ar BP: 163/96 mmHg Pulse Rate: 81 COWS:    PATIENT STRENGTHS: (choose at least two) Communication skills Work skills  Allergies: No Known Allergies  Home Medications:  (Not in a hospital admission)  OB/GYN Status:  Patient's last menstrual period was  02/09/2014.  General Assessment Data Location of Assessment: [facility] Is this a Tele or Face-to-Face Assessment?: Tele Assessment Is this an Initial Assessment or a Re-assessment for this encounter?: Initial Assessment Living Arrangements: Alone Can pt return to current living arrangement?: Yes Admission Status: Involuntary Is patient capable of signing voluntary admission?: No Transfer from: [facility] Referral Source: MD  Medical Screening Exam ([place]) Medical Exam completed: No Reason for MSE not  completed: Other: (None )  [place] Living Arrangements: Alone Name of Psychiatrist: Rupindar Toy Care  Name of Therapist: Nunzio Cobbs   Education Status Is patient currently in school?: No Current Grade: None  Highest grade of school patient has completed: None  Name of school: None  Contact person: None   Risk to self with the past 6 months Suicidal Ideation: No-Not Currently/Within Last 6 Months Suicidal Intent: No-Not Currently/Within Last 6 Months Is patient at risk for suicide?: No Suicidal Plan?: No-Not Currently/Within Last 6 Months Access to Means: No What has been your use of drugs/alcohol within the last 12 months?: Pt using alcohol  Previous Attempts/Gestures: Yes How many times?: 1 Other Self Harm Risks: None  Triggers for Past Attempts: Unpredictable Intentional Self Injurious Behavior: None Family Suicide History: No Recent stressful life event(s): Job Loss;Financial Problems;Conflict (Comment) (Issues with son's spouse ) Persecutory voices/beliefs?: No Depression: Yes Depression Symptoms: Loss of interest in usual pleasures Substance abuse history and/or treatment for substance abuse?: Yes Suicide prevention information given to non-admitted patients: Not applicable  Risk to Others within the past 6 months Homicidal Ideation: No Thoughts of Harm to Others: No Current Homicidal Intent: No Current Homicidal Plan: No Access to Homicidal Means: No Identified Victim: None  History of harm to others?: No Assessment of Violence: None Noted Violent Behavior Description: None  Does patient have access to weapons?: No Criminal Charges Pending?: No Does patient have a court date: No  Psychosis Hallucinations: None noted Delusions: None noted  Mental Status Report Appear/Hygiene: In scrubs Eye Contact: Good Motor Activity: Unremarkable Speech: Logical/coherent Level of Consciousness: Alert Mood: Anxious Affect: Anxious;Appropriate to  circumstance Anxiety Level: Moderate Thought Processes: Coherent;Relevant Judgement: Unimpaired Orientation: Person;Place;Time;Situation Obsessive Compulsive Thoughts/Behaviors: None  Cognitive Functioning Concentration: Normal Memory: Recent Intact;Remote Intact IQ: Average Insight: Fair Impulse Control: Fair Appetite: Good Weight Loss: 0 Weight Gain: 0 Sleep: No Change Total Hours of Sleep: 5 Vegetative Symptoms: None  ADLScreening [facility] Assessment Services) Patient's cognitive ability adequate to safely complete daily activities?: Yes Patient able to express need for assistance with ADLs?: Yes Independently performs ADLs?: Yes (appropriate for developmental age)  Prior Inpatient Therapy Prior Inpatient Therapy: No Prior Therapy Dates: None  Prior Therapy Facilty/Provider(s): None  Reason for Treatment: None   Prior Outpatient Therapy Prior Outpatient Therapy: Yes Prior Therapy Dates: Current  Prior Therapy Facilty/Provider(s): Rupindar Peyton Bottoms  Reason for Treatment: Med Mgt/Therapy   ADL Screening (condition at time of admission) Patient's cognitive ability adequate to safely complete daily activities?: Yes Is the patient deaf or have difficulty hearing?: No Does the patient have difficulty seeing, even when wearing glasses/contacts?: No Does the patient have difficulty concentrating, remembering, or making decisions?: No Patient able to express need for assistance with ADLs?: Yes Does the patient have difficulty dressing or bathing?: No Independently performs ADLs?: Yes (appropriate for developmental age) Does the patient have difficulty walking or climbing stairs?: No Weakness of Legs: None Weakness of Arms/Hands: None  Home Assistive Devices/Equipment Home Assistive Devices/Equipment: None  Therapy Consults (therapy  consults require a physician order) PT Evaluation Needed: No OT Evalulation Needed: No SLP Evaluation Needed: No Abuse/Neglect  Assessment (Assessment to be complete while patient is alone) Physical Abuse: Denies Verbal Abuse: Denies Sexual Abuse: Denies Exploitation of patient/patient's resources: Denies Self-Neglect: Denies Values / Beliefs Cultural Requests During Hospitalization: None Spiritual Requests During Hospitalization: None Consults Spiritual Care Consult Needed: No Social Work Consult Needed: No Regulatory affairs officer (For Healthcare) Does patient have an advance directive?: No Would patient like information on creating an advanced directive?: No - patient declined information Nutrition Screen- [facility] Patient's home diet: Regular  Additional Information 1:1 In Past 12 Months?: No CIRT Risk: No Elopement Risk: No Does patient have medical clearance?: Yes     Disposition:  Disposition Initial Assessment Completed for this Encounter: Yes Disposition of Patient: Referred to (AM psych eval for final diposition ) Patient referred to: Other (Comment) (AM psych eval for final disposition )  Girtha Rm 08/13/2014 11:59 PM

## 2014-08-13 NOTE — ED Notes (Signed)
IVC paperwork taken out by son and sts the Pt called and threatened suicide x 5 last night.  Sts Pt denied SI when confronted by police.  Sts Pt is drinking alcohol, taking prescription pills, and threatening to drive.

## 2014-08-13 NOTE — ED Notes (Signed)
Bed: [facility] Expected date:  Expected time:  Means of arrival:  Comments: Triage 3

## 2014-08-14 DIAGNOSIS — F332 Major depressive disorder, recurrent severe without psychotic features: Secondary | ICD-10-CM

## 2014-08-14 LAB — RAPID URINE DRUG SCREEN, HOSP PERFORMED
Amphetamines: NOT DETECTED
Barbiturates: NOT DETECTED
Benzodiazepines: POSITIVE — AB
COCAINE: NOT DETECTED
Opiates: NOT DETECTED
Tetrahydrocannabinol: NOT DETECTED

## 2014-08-14 NOTE — ED Notes (Signed)
Patient discharged to home.  All belongings returned.  She is denying any thoughts of harm to self or others.  States she has a Teacher, music and will follow up there.

## 2014-08-14 NOTE — BH Assessment (Signed)
[facility] Assessment Progress Note   PT was evaluated by Dr Corena Pilgrim and psychiatrically cleared for discharge.  First Examination was completed rescinding IVC and was faxed to magistrate and clerk of court.  COpy made for patient chart.  Original placed in IVC log.  Pt to begin IOP at [facility] on Tuesday, September 22nd at 8:45.

## 2014-08-14 NOTE — ED Notes (Signed)
Patient appeared to be restless, within this hour. Writer offered patient klonopin for anxiety. Q 15 minute check continues as ordered to maintain safety.

## 2014-08-14 NOTE — Consult Note (Signed)
[facility] Face-to-Face Psychiatry Consult   Reason for Consult:  Making suicide threat Referring Physician:  EDP SHADOW Coleman is an 53 y.o. female. Total Time spent with patient: 30 minutes  Assessment: AXIS I:  Major Depression, Recurrent severe AXIS II:  Deferred AXIS III:   Past Medical History  Diagnosis Date  . Anxiety   . Headache(784.0)   . Hypertension   . Depression    AXIS IV:  other psychosocial or environmental problems and problems related to social environment AXIS V:  61-70 mild symptoms  Plan:  No evidence of imminent risk to self or others at present.   Patient does not meet criteria for psychiatric inpatient admission.  Subjective:   [name] is a 53 y.o. female patient admitted due to threatening suicide.  HPI:  Patient present to ED via IVC petition, initiated by her son. Pt admits she was drinking last night and called her son and daughter-in-law and had an argument with them. Pt says the argument was about his wife being "unemployed and lazy". Pt says she had an appointment with her Dr. Toy Care and was picked up by police while at the psych's office because son "took papers out" on her. Pt denies SI/HI/AVH--"i just want to go the beach, I'm leaving in the morning", but states she did tell her son yesterday that she wanted to kill herself. Pt reports a previous SI by CO2 poisoning in 2014. She has no past inpt admissions.Today, patient  denies HI/AVH and says she only drinks on the weekends--"6 mixed drinks of vodka and lemonade". Patient reports her current stressors as:(1) family conflicts with son and his spouse, (2) parents declining health, (3) lost job 4 mos ago and (4) financial problems. Patient is requesting to be discharged to follow up with her doctor.  HPI Elements:   Location:  depression. Quality:  mild. Duration:  for one day. Context:  argument with her son.  Past Psychiatric History: Past Medical History  Diagnosis Date  . Anxiety   .  Headache(784.0)   . Hypertension   . Depression     reports that she has been smoking.  She does not have any smokeless tobacco history on file. She reports that she drinks alcohol. She reports that she does not use illicit drugs. No family history on file. Family History Substance Abuse: No Family Supports: Yes, List: Hydrologist ) Living Arrangements: Alone Can pt return to current living arrangement?: Yes Abuse/Neglect [facility]) Physical Abuse: Denies Verbal Abuse: Denies Sexual Abuse: Denies Allergies:  No Known Allergies  ACT Assessment Complete:  Yes:    Educational Status    Risk to Self: Risk to self with the past 6 months Suicidal Ideation: No-Not Currently/Within Last 6 Months Suicidal Intent: No-Not Currently/Within Last 6 Months Is patient at risk for suicide?: No Suicidal Plan?: No-Not Currently/Within Last 6 Months Access to Means: No What has been your use of drugs/alcohol within the last 12 months?: Pt using alcohol  Previous Attempts/Gestures: Yes How many times?: 1 Other Self Harm Risks: None  Triggers for Past Attempts: Unpredictable Intentional Self Injurious Behavior: None Family Suicide History: No Recent stressful life event(s): Job Loss;Financial Problems;Conflict (Comment) (Issues with son's spouse ) Persecutory voices/beliefs?: No Depression: Yes Depression Symptoms: Loss of interest in usual pleasures Substance abuse history and/or treatment for substance abuse?: Yes Suicide prevention information given to non-admitted patients: Not applicable  Risk to Others: Risk to Others within the past 6 months Homicidal Ideation: No Thoughts of Harm to  Others: No Current Homicidal Intent: No Current Homicidal Plan: No Access to Homicidal Means: No Identified Victim: None  History of harm to others?: No Assessment of Violence: None Noted Violent Behavior Description: None  Does patient have access to weapons?: No Criminal Charges Pending?: No Does patient  have a court date: No  Abuse: Abuse/Neglect Assessment (Assessment to be complete while patient is alone) Physical Abuse: Denies Verbal Abuse: Denies Sexual Abuse: Denies Exploitation of patient/patient's resources: Denies Self-Neglect: Denies  Prior Inpatient Therapy: Prior Inpatient Therapy Prior Inpatient Therapy: No Prior Therapy Dates: None  Prior Therapy Facilty/Provider(s): None  Reason for Treatment: None   Prior Outpatient Therapy: Prior Outpatient Therapy Prior Outpatient Therapy: Yes Prior Therapy Dates: Current  Prior Therapy Facilty/Provider(s): Rupindar Peyton Bottoms  Reason for Treatment: Med Mgt/Therapy   Additional Information: Additional Information 1:1 In Past 12 Months?: No CIRT Risk: No Elopement Risk: No Does patient have medical clearance?: Yes                  Objective: Blood pressure 146/81, pulse 64, temperature 97.6 F (36.4 C), temperature source Oral, resp. rate 18, last menstrual period 02/09/2014, SpO2 99.00%.There is no weight on file to calculate BMI. Results for orders placed during the hospital encounter of 08/13/14 (from the past 72 hour(s))  ETHANOL     Status: Abnormal   Collection Time    08/13/14  4:55 PM      Result Value Ref Range   Alcohol, Ethyl (B) 14 (*) 0 - 11 mg/dL   Comment:            LOWEST DETECTABLE LIMIT FOR     SERUM ALCOHOL IS 11 mg/dL     FOR MEDICAL PURPOSES ONLY  I-STAT CHEM 8, ED     Status: Abnormal   Collection Time    08/13/14  5:02 PM      Result Value Ref Range   Sodium 139  137 - 147 mEq/L   Potassium 4.1  3.7 - 5.3 mEq/L   Chloride 108  96 - 112 mEq/L   BUN 9  6 - 23 mg/dL   Creatinine, Ser 0.70  0.50 - 1.10 mg/dL   Glucose, Bld 108 (*) 70 - 99 mg/dL   Calcium, Ion 1.16  1.12 - 1.23 mmol/L   TCO2 24  0 - 100 mmol/L   Hemoglobin 16.3 (*) 12.0 - 15.0 g/dL   HCT 48.0 (*) 36.0 - 46.0 %   Labs are reviewed and are pertinent for .  Current Facility-Administered Medications   Medication Dose Route Frequency Provider Last Rate Last Dose  . acetaminophen (TYLENOL) tablet 650 mg  650 mg Oral Q4H PRN Francine Graven, DO      . alum & mag hydroxide-simeth (MAALOX/MYLANTA) 200-200-20 MG/5ML suspension 30 mL  30 mL Oral PRN Francine Graven, DO   30 mL at 08/13/14 2047  . clonazePAM (KLONOPIN) tablet 0.5 mg  0.5 mg Oral BID PRN Lurena Nida, NP   0.5 mg at 08/14/14 0314  . escitalopram (LEXAPRO) tablet 20 mg  20 mg Oral Daily Lurena Nida, NP   20 mg at 08/14/14 0926  . [START ON 08/20/2014] estradiol (CLIMARA - Dosed in mg/24 hr) patch 0.05 mg  0.05 mg Transdermal Weekly Lurena Nida, NP      . ibuprofen (ADVIL,MOTRIN) tablet 400 mg  400 mg Oral Q8H PRN Francine Graven, DO   400 mg at 08/14/14 0929  . LORazepam (ATIVAN) tablet 0-4 mg  0-4  mg Oral 4 times per day Francine Graven, DO       Followed by  . [START ON 08/16/2014] LORazepam (ATIVAN) tablet 0-4 mg  0-4 mg Oral Q12H Francine Graven, DO      . LORazepam (ATIVAN) tablet 1 mg  1 mg Oral Q8H PRN Francine Graven, DO   1 mg at 08/13/14 2047  . nicotine (NICODERM CQ - dosed in mg/24 hours) patch 21 mg  21 mg Transdermal Daily PRN Francine Graven, DO      . ondansetron [facility]) tablet 4 mg  4 mg Oral Q8H PRN Francine Graven, DO      . thiamine (VITAMIN B-1) tablet 100 mg  100 mg Oral Daily Francine Graven, DO   100 mg at 08/14/14 5188   Or  . thiamine (B-1) injection 100 mg  100 mg Intravenous Daily Francine Graven, DO      . zolpidem (AMBIEN) tablet 5 mg  5 mg Oral QHS Lurena Nida, NP   5 mg at 08/13/14 2132   Current Outpatient Prescriptions  Medication Sig Dispense Refill  . clonazePAM (KLONOPIN) 0.5 MG tablet Take 0.5 mg by mouth 2 (two) times daily as needed for anxiety.      Marland Kitchen eletriptan (RELPAX) 40 MG tablet Take 20 mg by mouth 2 (two) times daily as needed for migraine or headache. One tablet by mouth at onset of headache. May repeat in 2 hours if headache persists or recurs.      Marland Kitchen escitalopram  (LEXAPRO) 20 MG tablet Take 20 mg by mouth daily.      Marland Kitchen MINIVELLE 0.05 MG/24HR patch Place 1 patch onto the skin 2 (two) times a week. Change on Thursdays and Sundays      . zolpidem (AMBIEN) 5 MG tablet Take 5 mg by mouth at bedtime.         Psychiatric Specialty Exam:     Blood pressure 146/81, pulse 64, temperature 97.6 F (36.4 C), temperature source Oral, resp. rate 18, last menstrual period 02/09/2014, SpO2 99.00%.There is no weight on file to calculate BMI.  General Appearance: Fairly Groomed  Engineer, water::  Good  Speech:  Clear and Coherent  Volume:  Normal  Mood:  Negative  Affect:  Appropriate  Thought Process:  Goal Directed  Orientation:  Full (Time, Place, and Person)  Thought Content:  Negative  Suicidal Thoughts:  No  Homicidal Thoughts:  No  Memory:  Immediate;   Fair Recent;   Fair Remote;   Fair  Judgement:  Good  Insight:  Good  Psychomotor Activity:  Normal  Concentration:  Good  Recall:  Good  Fund of Knowledge:Good  Language: Good  Akathisia:  No  Handed:  Right  AIMS (if indicated):     Assets:  Communication Skills Desire for Improvement Social Support  Sleep:      Musculoskeletal: Strength & Muscle Tone: within normal limits Gait & Station: normal Patient leans: Right  Treatment Plan Summary: Daily contact with patient to assess and evaluate symptoms and progress in treatment Medication management [place] will be discharged to follow up with Dr Alfredo Bach, MD 08/14/2014 9:50 AM

## 2014-08-14 NOTE — [facility] Suicide Risk Assessment (Signed)
   Demographic Factors:  Caucasian and Unemployed  Total Time spent with patient: 20 minutes  Psychiatric Specialty Exam: Physical Exam  Psychiatric: She has a normal mood and affect. Her speech is normal and behavior is normal. Judgment and thought content normal. Cognition and memory are normal.    Review of Systems  Constitutional: Negative.   HENT: Negative.   Eyes: Negative.   Respiratory: Negative.   Cardiovascular: Negative.   Gastrointestinal: Negative.   Genitourinary: Negative.   Musculoskeletal: Negative.   Skin: Negative.   Neurological: Negative.   Endo/Heme/Allergies: Negative.   Psychiatric/Behavioral: Negative.     Blood pressure 146/81, pulse 64, temperature 97.6 F (36.4 C), temperature source Oral, resp. rate 18, last menstrual period 02/09/2014, SpO2 99.00%.There is no weight on file to calculate BMI.  General Appearance: Casual  Eye Contact::  Good  Speech:  Clear and Coherent  Volume:  Normal  Mood:  Euthymic  Affect:  Appropriate  Thought Process:  Goal Directed  Orientation:  Full (Time, Place, and Person)  Thought Content:  Negative  Suicidal Thoughts:  No  Homicidal Thoughts:  No  Memory:  Immediate;   Fair Recent;   Fair Remote;   Fair  Judgement:  Good  Insight:  Good  Psychomotor Activity:  Negative  Concentration:  Fair  Recall:  [employer] of Knowledge:Fair  Language: Good  Akathisia:  No  Handed:  Right  AIMS (if indicated):     Assets:  Communication Skills  Sleep:       Musculoskeletal: Strength & Muscle Tone: within normal limits Gait & Station: normal Patient leans: N/A   Mental Status Per Nursing Assessment::   On Admission:     Current Mental Status by Physician: Patient denies suicidal thoughts  Loss Factors: Financial problems/change in socioeconomic status  Historical Factors: NA  Risk Reduction Factors:   Sense of responsibility to family, Living with another person, especially a relative and Positive  social support  Continued Clinical Symptoms:  Resolving depression  Cognitive Features That Contribute To Risk:  Polarized thinking    Suicide Risk:  Minimal: No identifiable suicidal ideation.  Patients presenting with no risk factors but with morbid ruminations; may be classified as minimal risk based on the severity of the depressive symptoms  Discharge Diagnoses:   AXIS I:  Major Depression, Recurrent severe AXIS II:  Deferred AXIS III:   Past Medical History  Diagnosis Date  . Anxiety   . Headache(784.0)   . Hypertension   . Depression    AXIS IV:  economic problems, other psychosocial or environmental problems and problems related to social environment AXIS V:  61-70 mild symptoms  Plan Of Care/Follow-up recommendations:  Activity:  as tolerated Diet:  healthy  Is patient on multiple antipsychotic therapies at discharge:  No   Has Patient had three or more failed trials of antipsychotic monotherapy by history:  No  Recommended Plan for Multiple Antipsychotic Therapies: NA    Corena Pilgrim, MD 08/14/2014, 9:57 AM

## 2014-08-14 NOTE — ED Notes (Signed)
Writer gave a urine cup to patient this morning into to give sample for her order. Patient got about 3 drops of urine in the cup. She said she was unable to catch her urine. Writer will notify her primary RN to follow up with the order.

## 2014-08-14 NOTE — Discharge Instructions (Signed)
Alcohol Intoxication  Alcohol intoxication occurs when the amount of alcohol that a person has consumed impairs his or her ability to mentally and physically function. Alcohol directly impairs the normal chemical activity of the brain. Drinking large amounts of alcohol can lead to changes in mental function and behavior, and it can cause many physical effects that can be harmful.   Alcohol intoxication can range in severity from mild to very severe. Various factors can affect the level of intoxication that occurs, such as the person's age, gender, weight, frequency of alcohol consumption, and the presence of other medical conditions (such as diabetes, seizures, or heart conditions). Dangerous levels of alcohol intoxication may occur when people drink large amounts of alcohol in a short period (binge drinking). Alcohol can also be especially dangerous when combined with certain prescription medicines or "recreational" drugs.  SIGNS AND SYMPTOMS  Some common signs and symptoms of mild alcohol intoxication include:  · Loss of coordination.  · Changes in mood and behavior.  · Impaired judgment.  · Slurred speech.  As alcohol intoxication progresses to more severe levels, other signs and symptoms will appear. These may include:  · Vomiting.  · Confusion and impaired memory.  · Slowed breathing.  · Seizures.  · Loss of consciousness.  DIAGNOSIS   Your health care provider will take a medical history and perform a physical exam. You will be asked about the amount and type of alcohol you have consumed. Blood tests will be done to measure the concentration of alcohol in your blood. In many places, your blood alcohol level must be lower than 80 mg/dL (0.08%) to legally drive. However, many dangerous effects of alcohol can occur at much lower levels.   TREATMENT   People with alcohol intoxication often do not require treatment. Most of the effects of alcohol intoxication are temporary, and they go away as the alcohol naturally  leaves the body. Your health care provider will monitor your condition until you are stable enough to go home. Fluids are sometimes given through an IV access tube to help prevent dehydration.   HOME CARE INSTRUCTIONS  · Do not drive after drinking alcohol.  · Stay hydrated. Drink enough water and fluids to keep your urine clear or pale yellow. Avoid caffeine.    · Only take over-the-counter or prescription medicines as directed by your health care provider.    SEEK MEDICAL CARE IF:   · You have persistent vomiting.    · You do not feel better after a few days.  · You have frequent alcohol intoxication. Your health care provider can help determine if you should see a substance use treatment counselor.  SEEK IMMEDIATE MEDICAL CARE IF:   · You become shaky or tremble when you try to stop drinking.    · You shake uncontrollably (seizure).    · You throw up (vomit) blood. This may be bright red or may look like black coffee grounds.    · You have blood in your stool. This may be bright red or may appear as a black, tarry, bad smelling stool.    · You become lightheaded or faint.    MAKE SURE YOU:   · Understand these instructions.  · Will watch your condition.  · Will get help right away if you are not doing well or get worse.  Document Released: 08/31/2005 Document Revised: 07/24/2013 Document Reviewed: 04/26/2013  ExitCare® Patient Information ©2015 [employer]. This information is not intended to replace advice given to you by your health care provider. Make sure   you discuss any questions you have with your health care provider.

## 2014-08-21 ENCOUNTER — Other Ambulatory Visit: Payer: Self-pay | Admitting: Physician Assistant

## 2014-09-10 ENCOUNTER — Other Ambulatory Visit (HOSPITAL_COMMUNITY): Payer: 59 | Attending: Psychiatry | Admitting: Psychology

## 2014-09-10 ENCOUNTER — Encounter (HOSPITAL_COMMUNITY): Payer: Self-pay | Admitting: Psychology

## 2014-09-10 DIAGNOSIS — F10230 Alcohol dependence with withdrawal, uncomplicated: Secondary | ICD-10-CM

## 2014-09-10 DIAGNOSIS — F1020 Alcohol dependence, uncomplicated: Secondary | ICD-10-CM | POA: Diagnosis present

## 2014-09-10 DIAGNOSIS — F17210 Nicotine dependence, cigarettes, uncomplicated: Secondary | ICD-10-CM | POA: Insufficient documentation

## 2014-09-10 DIAGNOSIS — F329 Major depressive disorder, single episode, unspecified: Secondary | ICD-10-CM | POA: Diagnosis not present

## 2014-09-10 DIAGNOSIS — F419 Anxiety disorder, unspecified: Secondary | ICD-10-CM | POA: Insufficient documentation

## 2014-09-10 DIAGNOSIS — F4310 Post-traumatic stress disorder, unspecified: Secondary | ICD-10-CM | POA: Diagnosis not present

## 2014-09-10 DIAGNOSIS — Z609 Problem related to social environment, unspecified: Secondary | ICD-10-CM | POA: Insufficient documentation

## 2014-09-11 ENCOUNTER — Other Ambulatory Visit (HOSPITAL_COMMUNITY): Payer: 59

## 2014-09-11 ENCOUNTER — Encounter (HOSPITAL_COMMUNITY): Payer: Self-pay | Admitting: Psychology

## 2014-09-11 DIAGNOSIS — F10230 Alcohol dependence with withdrawal, uncomplicated: Secondary | ICD-10-CM | POA: Insufficient documentation

## 2014-09-11 NOTE — Progress Notes (Signed)
    Daily Group Progress Note  Program: CD-IOP   Group Time: 1-1:45 pm  Participation Level: Active  Behavioral Response: Sharing  Type of Therapy: Process Group  Topic: Process: The first part of group was spent in process. Members checked-in with sobriety dates and followed up with issues and concerns in early recovery. One member admitted he had relapsed and the relapse was discussed at length. Two new members were present and they shared briefly about themselves. The new members shared openly about the reasons for being in the group and they received good feedback and support from their new fellow group members. Both admitted that they were very hesitant and anxious about this first group experience, but reported that they felt much more relaxed after hearing others share about their experiences.   Group Time: 2 - 4 pm  Participation Level: Active  Behavioral Response: Appropriate and Sharing  Type of Therapy: Psycho-education Group  Topic: Guest Speaker: the remainder of the session was spent with a guest visitor. This man talked about his life and his chemical use that began at a young age. He talked about having been confronted by family and friends and the growing negative consequences of his drug use. The visitor invited questions and group members asked about certain feelings or experiences and he was very receptive and offered good feedback. The session was inspiring and offered possibilities sand hope that some may not have had previously. Drug tests were collected this afternoon.    Summary: The patient was new to the group and explained she had never been in a group therapy setting before. She introduced herself as uncertain about her alcohol use. Despite the uncertainty and admitted nervousness, she opened up and described the events that had led to her recent involuntary commitment and subsequent arrival here at [facility]. The patient disclosed blackouts followed by angry texts  and phone calls. Most recently, she phoned her son's mother-in-law and then his wife and shared hurtful insulting feelings towards them. She was teary as she recalled these events and cried quietly for much of the session.  The patient described enjoying drinking "by myself" and two other members confirmed that they felt the same way. The patient provided good feedback during the guest speaker's visit and she responded very well to this first group session. She admitted she hoped that she could one day drink socially, but other members quickly provided her with their own stories and all had been dismal failures. The patient was receptive to feedback and she responded very well to this first group session. She admitted that yesterday was her birthday and she hd had two glasses of wine. Her sobriety date is today, October 7th.   Family Program: Family present? No   Name of family member(s):   UDS collected: No Results:   AA/NA attended?: No  Sponsor?: No   Castulo Scarpelli, LCAS

## 2014-09-11 NOTE — Progress Notes (Unsigned)
[name] is a 53 y.o. female patient ***. Orientation for CD-IOP:  The patient is a 53 year old divorced, Caucasian, female who lives alone in [place]. She is being referred to the program after being IVC to [facility] by her 58 yo son for suicidal ideation. She spent 1 night, 9/21, at [facility] and was originally referred to the [facility] IOP program.  After assessing her alcohol use, the patient was referred to CD-IOP. The patient could not begin the program immediately due to her use of Ambien and Klonopin. She was referred back to her psychiatrist, Dr. Toy Care, to be tapered off the medications. Her last use of Klonopin was 9/20 and her last use of Ambien was 9/30. Dr. Toy Care started her on a Benadryl regimen for sleep. The patient finished the taper and came back for orientation today. She will return this afternoon and begin the CD-IOP.  She began drinking heavily at age 53, after her divorce, and began having problems at her job. In 2013 she started drinking a fifth of vodka every other day to get "numb". She reports feeling euphoria leaving work and thinking about the vodka. The patient stopped drinking May 2015 for 6 weeks during her recuperation period from her hysterectomy and reported she really felt good during that time. However, in June 2015, after returning to work, she walked off the job and immediately went to see Dr. Toy Care who wrote her out on FMLA. She also sees Nunzio Cobbs, a therapist for depression and anxiety. The patient is a Corporate treasurer at [employer], but does not plan to return to her job after the completion of FMLA. The patient denied any SI/HI. The patient reported there is no addiction on either side of her family except for her father, who continues to drink alcoholically. The patient does not believe she is an alcoholic and wants to stop using vodka and just drink wine. She drank 2 glasses of wine on 10/6 to celebrate her birthday. The papers were reviewed and signed. She will begin the CD-IOP  later today. (Note by Binnie Rail, LCAS-A)       [name], LCAS

## 2014-09-12 ENCOUNTER — Encounter (HOSPITAL_COMMUNITY): Payer: Self-pay | Admitting: Medical

## 2014-09-12 ENCOUNTER — Other Ambulatory Visit (HOSPITAL_COMMUNITY): Payer: 59 | Admitting: Psychology

## 2014-09-12 ENCOUNTER — Other Ambulatory Visit (HOSPITAL_COMMUNITY): Payer: 59

## 2014-09-12 DIAGNOSIS — F1020 Alcohol dependence, uncomplicated: Secondary | ICD-10-CM | POA: Diagnosis not present

## 2014-09-12 DIAGNOSIS — F4310 Post-traumatic stress disorder, unspecified: Secondary | ICD-10-CM | POA: Insufficient documentation

## 2014-09-12 DIAGNOSIS — Z6372 Alcoholism and drug addiction in family: Secondary | ICD-10-CM

## 2014-09-12 DIAGNOSIS — Z811 Family history of alcohol abuse and dependence: Secondary | ICD-10-CM | POA: Insufficient documentation

## 2014-09-12 MED ORDER — TRAZODONE HCL 50 MG PO TABS: 50.0000 mg | ORAL_TABLET | Freq: Every day | ORAL | Status: DC
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
## 2014-09-13 ENCOUNTER — Encounter (HOSPITAL_COMMUNITY): Payer: Self-pay

## 2014-09-15 ENCOUNTER — Encounter (HOSPITAL_COMMUNITY): Payer: Self-pay | Admitting: Psychology

## 2014-09-15 ENCOUNTER — Other Ambulatory Visit (HOSPITAL_COMMUNITY): Payer: 59 | Admitting: Psychology

## 2014-09-15 DIAGNOSIS — Z811 Family history of alcohol abuse and dependence: Secondary | ICD-10-CM

## 2014-09-15 DIAGNOSIS — F10230 Alcohol dependence with withdrawal, uncomplicated: Secondary | ICD-10-CM

## 2014-09-15 DIAGNOSIS — F1020 Alcohol dependence, uncomplicated: Secondary | ICD-10-CM | POA: Diagnosis not present

## 2014-09-15 NOTE — Progress Notes (Signed)
    Daily Group Progress Note  Program: CD-IOP   Group Time: 1-2 pm  Participation Level: Active  Behavioral Response: Sharing  Type of Therapy: Process Group  Topic: Process: the first part of group was spent in process. Members shared about the past 2 days and events, experiences or challenges in early recovery. One member had returned today after being gone for the last 4 sessions in order to complete a Xanax taper. A new group member was also present today. She had been in the group about 5 weeks ago, but had relapsed and gone to [employer]. She introduced herself and explained what she needed to work on while in the program. There was good feedback and disclosures among members. Drug tests were collected from the new group members and those current members who had not been present in Wednesday's session. During the group today, the medical director pulled out new members along with current members to discuss medications or complete their assessment.   Group Time: 2:15-4 pm  Participation Level: Active  Behavioral Response: Sharing  Type of Therapy: Psycho-education Group  Topic: Guest Speaker: Pharmacist. The pharmacist, EP, from upstairs in [facility] visited the group today. She discussed the effects of drugs and early withdrawal and what those chemicals cause in the brain. She also shared about mediations frequently prescribed to address various types of mental illness. The guest speaker also identified what medications should be encouraged and what should be avoided when the patient is chemically dependent. There were many questions and the pharmacist answered all of them. The session went well with the members discussing how much they had learned from her visit.   Summary: The patient appeared for her second group session today. She had not attended an [employer] meeting since the last group session. The patient reported she had completed the job interview she had disclosed in the last  group session, but she admitted it had not gone well. he patient described having smoked a cigarette yesterday evening, but she reported it had tasted terrible. She reminded the group that she generally smoked when she drank. The patient reported she had opted not to attend the HS reunion in [place]. There would be lots of drinking and she didn't want to go there. She was attentive and made some good comments. She had been prescribed Ambien previously, but is no longer taking it. Her sobriety date is 10/7.   Family Program: Family present? No   Name of family member(s):   UDS collected: Yes Results: not returned from lab  AA/NA attended?: No  Sponsor?: No   Caoilainn Sacks, LCAS

## 2014-09-16 ENCOUNTER — Other Ambulatory Visit (HOSPITAL_COMMUNITY): Payer: 59

## 2014-09-16 ENCOUNTER — Encounter (HOSPITAL_COMMUNITY): Payer: Self-pay | Admitting: Psychology

## 2014-09-16 NOTE — Progress Notes (Unsigned)
[name] is a 53 y.o. female patient ***. CD-IOP:  Treatment Planning Session. I met with the patient after group today for her first individual session to discuss goals for treatment. Her older son had had her IVC for her alcohol addiction where she spent 1 night at [facility] and was referred to CD-IOP.  The patient has attended 3 group sessions. The patient was agreeable in identifying 2 goals for treatment. The first goal is to establish and maintain abstinence, which has proven to be difficult in the past. The patient has only been able to maintain short periods of sobriety during the last few years and that was due to recovery from surgery and her desire to lose weight for her son's wedding (she was also taking Klonipin and Ambien at the same time).  The patient has been resistant to admitting she was an alcoholic as well as going to 12-step meetings. She did attend one meeting last week but seems to have many excuses about why she can't attend more meetings. The group has encouraged her to attend meetings where she will meet other women who may be experiencing the same problem of addiction. The patient doesn't feel like she fits in at the meetings. The patient admitted that she has continued taking her Ambien to sleep, despite repeated instructions to halt all use of benzodiazepines and sedatives. She was prescribed Trazadone by the Program Director Harold Hedge on Friday when she first met with him, but had not yet taken any. The patient also reports she took a Klonopin on 10/7, after her first CD-IOP group. She reports it's very difficult to give up alcohol, Ambien and Klonopin at the same time. It was explained to her again that she cannot be in the program and take benzodiazepines. The drug test collected on Friday was positive for Klonipin and Ambien. The symptoms of Post-Acute Withdrawal Syndrome (PAWS) were also discussed in detail during our session. The patient admitted she had been very confused and worried  because she was experiencing almost all of the symptoms described, but had no knowledge about PAWS. The patient is scheduled to meet with her psychiatrist, Dr. Toy Care, tomorrow. The treatment plan was reviewed, signed and dated and she responded well to the intervention. She will be seen for individual session 1x per week during the duration of the CD-IOP Program. [facility], LPC-A, LCAS-A)       Carmel Garfield, LCAS

## 2014-09-16 NOTE — Progress Notes (Unsigned)
[name] is a 53 y.o. female patient ***. CD-IOP:  Treatment Planning Session. I met with the patient after group today for her first individual session to discuss goals for treatment. Her older son had had her IVC for her alcohol addiction where she spent 1 night at [facility] and was referred to CD-IOP.  The patient has attended 3 group sessions. The patient was agreeable in identifying 2 goals for treatment. The first goal is to establish and maintain abstinence, which has proven to be difficult in the past. The patient has only been able to maintain short periods of sobriety during the last few years and that was due to recovery from surgery and her desire to lose weight for her son's wedding (she was also taking Klonipin and Ambien at the same time).  The patient has been resistant to admitting she was an alcoholic as well as going to 12-step meetings. She did attend one meeting last week but seems to have many excuses about why she can't attend more meetings. The group has encouraged her to attend meetings where she will meet other women who may be experiencing the same problem of addiction. The patient doesn't feel like she fits in at the meetings. The patient admitted that she has continued taking her Ambien to sleep, despite repeated instructions to halt all use of benzodiazepines and sedatives. She was prescribed Trazadone by the Program Director Harold Hedge on Friday when she first met with him, but had not yet taken any. The patient also reports she took a Klonopin on 10/7, after her first CD-IOP group. She reports it's very difficult to give up alcohol, Ambien and Klonopin at the same time. It was explained to her again that she cannot be in the program and take benzodiazepines. The drug test collected on Friday was positive for Klonipin and Ambien. The symptoms of Post-Acute Withdrawal Syndrome (PAWS) were also discussed in detail during our session. The patient admitted she had been very confused and worried  because she was experiencing almost all of the symptoms described, but had no knowledge about PAWS. The patient is scheduled to meet with her psychiatrist, Dr. Toy Care, tomorrow. The treatment plan was reviewed, signed and dated and she responded well to the intervention. She will be seen for individual session 1x per week during the duration of the CD-IOP Program.       Betsabe Iglesia, LCAS

## 2014-09-16 NOTE — Progress Notes (Signed)
    Daily Group Progress Note  Program: CD-IOP   Group Time: 1-2:30 pm  Participation Level: Active  Behavioral Response: Sharing, Resistant and Minimizing  Type of Therapy: Process Group  Topic: After checking in, group members shared what the weekend was like for them, as well as any problems that were encountered.  Two group members shared that they had relapsed and the group processed what had happened, challenging each other when rationalization was evident.  There was one new group member who was able to introduce herself and share what has led her to the program.  Group Time: 2:45- 4pm  Participation Level: Active  Behavioral Response: Sharing  Type of Therapy: Psycho-education Group  Topic: The second half of group was spent discussing the issues of Core Beliefs and Self-Esteem.  The group completed a free write activity in which they wrote about themselves, and then shared what they had written with a partner.  Discussion exploring what the experience was like for group members followed.  The group watched a PowerPoint, while discussing their self esteem, core beliefs, and early life experiences that contributed to the development of those core beliefs.   Summary: The patient stated that she had a good weekend and did not go to her class reunion so she would not be around alcohol.  She did some gardening and walking over the weekend and stated that she has still been taking her Ambien at night.  She is nervous to start the Trazodone for fear that it will make her feel groggy.  She stated that she has been craving Klonopin and was encouraged by the group to get those medications out of her house.  She also reported feeling dizzy and foggy.  She shared that her job had been "killing her" and that she prayed to be fired.  Now she has three weeks worth of money left and is beginning to worry about finances.  She attended a meeting on Friday night and plans to attend another one  tonight.  Due to her Ambien use, the patient's sobriety date is now 10/12.   Family Program: Family present? No   Name of family member(s):   UDS collected: No Results:  AA/NA attended?: YesFriday  Sponsor?: No   Ajah Vanhoose, LCAS

## 2014-09-17 ENCOUNTER — Other Ambulatory Visit (HOSPITAL_COMMUNITY): Payer: 59

## 2014-09-17 ENCOUNTER — Encounter (HOSPITAL_COMMUNITY): Payer: Self-pay | Admitting: Licensed Clinical Social Worker

## 2014-09-18 ENCOUNTER — Telehealth (HOSPITAL_COMMUNITY): Payer: Self-pay | Admitting: Licensed Clinical Social Worker

## 2014-09-18 ENCOUNTER — Other Ambulatory Visit (HOSPITAL_COMMUNITY): Payer: 59

## 2014-09-18 ENCOUNTER — Telehealth (HOSPITAL_COMMUNITY): Payer: Self-pay | Admitting: Psychology

## 2014-09-18 NOTE — Progress Notes (Unsigned)
[name] is a 53 y.o. female patient ***. CD-IOP:  I spoke with this pt 2x today after speaking with program director, Darlyne Russian, regarding her continued use of Ambien. The pt has been prescribed Ambien by her psychiatrist Dr. Toy Care for 5 years. When pt sought entry into the CD-IOP program she agreed she would stop taking her Ambien and klonopin and follow Dr. Starleen Arms taper instructions before she entered the program. After agreeing to this, the pt entered the program 2 weeks later with the understanding that she would no longer be taking these meds. Upon entry into the program and initial ua results it was determined that she was continuing to take the Ambien and admitted as much when the results were made available to her. The patient she had continued to take Ambien and was resistant to taking Trazadone which had been prescribed by Harold Hedge. Despite taking Trazadone, one night, pt reported she did not sleep and would no longer take Trazadone. Harold Hedge suggested the pt. enter into a sleep study program and I prepared information on a sleep study at [facility] to provide her at group. Pt. Did not come to group because of feeling anxious, sleep deprived and shaky. It remains to be seen to see if this pt will remain in the program due to the requirements around approved meds.       Brinda Focht S, Licensed Cli

## 2014-09-19 ENCOUNTER — Other Ambulatory Visit (HOSPITAL_COMMUNITY): Payer: 59

## 2014-09-22 ENCOUNTER — Other Ambulatory Visit (HOSPITAL_COMMUNITY): Payer: 59

## 2014-09-23 ENCOUNTER — Other Ambulatory Visit (HOSPITAL_COMMUNITY): Payer: 59

## 2014-09-24 ENCOUNTER — Other Ambulatory Visit (HOSPITAL_COMMUNITY): Payer: 59

## 2014-09-25 ENCOUNTER — Other Ambulatory Visit (HOSPITAL_COMMUNITY): Payer: 59

## 2014-09-26 ENCOUNTER — Other Ambulatory Visit (HOSPITAL_COMMUNITY): Payer: 59

## 2014-09-29 ENCOUNTER — Other Ambulatory Visit (HOSPITAL_COMMUNITY): Payer: 59

## 2014-09-30 ENCOUNTER — Other Ambulatory Visit (HOSPITAL_COMMUNITY): Payer: 59

## 2014-10-01 ENCOUNTER — Other Ambulatory Visit (HOSPITAL_COMMUNITY): Payer: 59

## 2014-10-02 ENCOUNTER — Other Ambulatory Visit (HOSPITAL_COMMUNITY): Payer: 59

## 2014-10-03 ENCOUNTER — Other Ambulatory Visit (HOSPITAL_COMMUNITY): Payer: 59

## 2014-10-06 ENCOUNTER — Other Ambulatory Visit (HOSPITAL_COMMUNITY): Payer: 59

## 2014-10-07 ENCOUNTER — Other Ambulatory Visit (HOSPITAL_COMMUNITY): Payer: 59

## 2014-10-08 ENCOUNTER — Other Ambulatory Visit (HOSPITAL_COMMUNITY): Payer: 59

## 2014-10-09 ENCOUNTER — Other Ambulatory Visit (HOSPITAL_COMMUNITY): Payer: 59

## 2014-10-10 ENCOUNTER — Other Ambulatory Visit (HOSPITAL_COMMUNITY): Payer: 59

## 2014-10-12 ENCOUNTER — Encounter (HOSPITAL_COMMUNITY): Payer: Self-pay

## 2014-10-12 NOTE — Progress Notes (Unsigned)
  [facility] Dependency Intensive Outpatient Discharge Summary   [name] [id number]  Date of Admission: 09/10/2014 Date of Discharge: 10/02/2014                                                                   Course of Treatment: Pt was asdmitted to CD IOP after initial angry denial. She c/o of sleep disorder that had never been evaluated and claimed dependence on Ambien which she wa informed she must stop while in CD IOP. She gave the appearance of compliance but infact she never stopped taking the drug and she was discharged with recommendation she receive a higher level of care as she had only received detox treatment and was unable to complete CD IOP    Goals and Activities to Help Maintain Sobriety: 1. Stay away from old friends who continue to drink and use mind-altering chemicals. 2. Continue practicing Fair Fighting rules in interpersonal conflicts. 3. Continue alcohol and drug refusal skills and call on support systems. 4. Go to IP treatment  Referrals:IP treatment center of choice   Aftercare services: NA 1. Attend AA/NA meetings NA times per week. 2. Obtain a sponsor and a home group in [place]. 3. Return to Psychotherapist: PRN  Next appointment: NA  Plan of Action to Address Continuing Problems: IP therapy recommended    Client has participated in the development of this discharge plan and has received a copy of this completed plan  Dara Hoyer  10/12/2014   [facility]M 10/12/2014

## 2014-10-13 ENCOUNTER — Other Ambulatory Visit (HOSPITAL_COMMUNITY): Payer: 59

## 2014-10-14 ENCOUNTER — Other Ambulatory Visit (HOSPITAL_COMMUNITY): Payer: 59

## 2014-10-15 ENCOUNTER — Other Ambulatory Visit (HOSPITAL_COMMUNITY): Payer: 59

## 2014-10-16 ENCOUNTER — Other Ambulatory Visit (HOSPITAL_COMMUNITY): Payer: 59

## 2014-10-17 ENCOUNTER — Other Ambulatory Visit (HOSPITAL_COMMUNITY): Payer: 59

## 2014-10-20 ENCOUNTER — Other Ambulatory Visit (HOSPITAL_COMMUNITY): Payer: 59

## 2014-10-21 ENCOUNTER — Other Ambulatory Visit (HOSPITAL_COMMUNITY): Payer: 59

## 2014-10-22 ENCOUNTER — Encounter: Payer: Self-pay | Admitting: Psychiatry

## 2014-10-22 ENCOUNTER — Other Ambulatory Visit (HOSPITAL_COMMUNITY): Payer: 59

## 2014-10-27 ENCOUNTER — Other Ambulatory Visit: Payer: Self-pay | Admitting: Urology

## 2014-11-07 ENCOUNTER — Encounter (HOSPITAL_BASED_OUTPATIENT_CLINIC_OR_DEPARTMENT_OTHER): Payer: Self-pay | Admitting: *Deleted

## 2014-11-07 NOTE — Progress Notes (Signed)
NPO AFTER MN. ARRIVE AT 1015. NEEDS ISTAT 8. WILL TAKE NEXIUM AND LEXAPRO AM DOS W/ SIPS OF WATER.

## 2014-11-12 ENCOUNTER — Encounter (HOSPITAL_BASED_OUTPATIENT_CLINIC_OR_DEPARTMENT_OTHER)
Admission: RE | Disposition: A | Discharge status: Home / Self Care | Payer: Self-pay | Source: Ambulatory Visit | Attending: Urology

## 2014-11-12 ENCOUNTER — Ambulatory Visit (HOSPITAL_BASED_OUTPATIENT_CLINIC_OR_DEPARTMENT_OTHER): Payer: 59 | Admitting: Anesthesiology

## 2014-11-12 ENCOUNTER — Encounter (HOSPITAL_BASED_OUTPATIENT_CLINIC_OR_DEPARTMENT_OTHER): Payer: Self-pay

## 2014-11-12 ENCOUNTER — Ambulatory Visit (HOSPITAL_BASED_OUTPATIENT_CLINIC_OR_DEPARTMENT_OTHER)
Admission: RE | Admit: 2014-11-12 | Discharge: 2014-11-12 | Disposition: A | Discharge status: Home / Self Care | Payer: 59 | Source: Ambulatory Visit | Attending: Urology | Admitting: Urology

## 2014-11-12 DIAGNOSIS — F1020 Alcohol dependence, uncomplicated: Secondary | ICD-10-CM | POA: Insufficient documentation

## 2014-11-12 DIAGNOSIS — K219 Gastro-esophageal reflux disease without esophagitis: Secondary | ICD-10-CM | POA: Insufficient documentation

## 2014-11-12 DIAGNOSIS — N202 Calculus of kidney with calculus of ureter: Secondary | ICD-10-CM | POA: Insufficient documentation

## 2014-11-12 DIAGNOSIS — I10 Essential (primary) hypertension: Secondary | ICD-10-CM | POA: Diagnosis not present

## 2014-11-12 DIAGNOSIS — F329 Major depressive disorder, single episode, unspecified: Secondary | ICD-10-CM | POA: Insufficient documentation

## 2014-11-12 DIAGNOSIS — F4310 Post-traumatic stress disorder, unspecified: Secondary | ICD-10-CM | POA: Diagnosis not present

## 2014-11-12 DIAGNOSIS — Z87891 Personal history of nicotine dependence: Secondary | ICD-10-CM | POA: Insufficient documentation

## 2014-11-12 DIAGNOSIS — N200 Calculus of kidney: Secondary | ICD-10-CM | POA: Diagnosis present

## 2014-11-12 DIAGNOSIS — F419 Anxiety disorder, unspecified: Secondary | ICD-10-CM | POA: Insufficient documentation

## 2014-11-12 HISTORY — DX: Calculus of ureter: N20.1

## 2014-11-12 HISTORY — DX: Alcohol dependence, uncomplicated: F10.20

## 2014-11-12 HISTORY — DX: Gastro-esophageal reflux disease without esophagitis: K21.9

## 2014-11-12 HISTORY — DX: Calculus of kidney: N20.0

## 2014-11-12 HISTORY — DX: Urgency of urination: R39.15

## 2014-11-12 HISTORY — DX: Frequency of micturition: R35.0

## 2014-11-12 HISTORY — DX: Post-traumatic stress disorder, unspecified: F43.10

## 2014-11-12 HISTORY — PX: HOLMIUM LASER APPLICATION: SHX5852

## 2014-11-12 HISTORY — DX: Major depressive disorder, single episode, unspecified: F32.9

## 2014-11-12 LAB — POCT I-STAT, CHEM 8
BUN: 9 mg/dL (ref 6–23)
Calcium, Ion: 1.24 mmol/L — ABNORMAL HIGH (ref 1.12–1.23)
Chloride: 104 mEq/L (ref 96–112)
Creatinine, Ser: 0.8 mg/dL (ref 0.50–1.10)
GLUCOSE: 106 mg/dL — AB (ref 70–99)
HEMATOCRIT: 46 % (ref 36.0–46.0)
HEMOGLOBIN: 15.6 g/dL — AB (ref 12.0–15.0)
Potassium: 4.2 mEq/L (ref 3.7–5.3)
Sodium: 140 mEq/L (ref 137–147)
TCO2: 23 mmol/L (ref 0–100)

## 2014-11-12 SURGERY — CYSTOSCOPY/URETEROSCOPY/HOLMIUM LASER/STENT PLACEMENT
Anesthesia: General | Site: Ureter | Laterality: Right

## 2014-11-12 MED ORDER — ONDANSETRON HCL 4 MG/2ML IJ SOLN
INTRAMUSCULAR | Status: DC | PRN
  Administered 2014-11-12: 4 mg via INTRAVENOUS

## 2014-11-12 MED ORDER — KETOROLAC TROMETHAMINE 30 MG/ML IJ SOLN
INTRAMUSCULAR | Status: DC | PRN
  Administered 2014-11-12: 30 mg via INTRAVENOUS

## 2014-11-12 MED ORDER — MEPERIDINE HCL 25 MG/ML IJ SOLN
6.2500 mg | INTRAMUSCULAR | Status: DC | PRN
  Filled 2014-11-12: qty 1

## 2014-11-12 MED ORDER — CEPHALEXIN 500 MG PO CAPS: 500.0000 mg | ORAL_CAPSULE | Freq: Two times a day (BID) | ORAL | Status: DC

## 2014-11-12 MED ORDER — OXYCODONE HCL 5 MG/5ML PO SOLN
5.0000 mg | Freq: Once | ORAL | Status: AC | PRN
  Filled 2014-11-12: qty 5

## 2014-11-12 MED ORDER — OXYCODONE-ACETAMINOPHEN 5-325 MG PO TABS: 1.0000 | ORAL_TABLET | Freq: Four times a day (QID) | ORAL | Status: DC | PRN

## 2014-11-12 MED ORDER — LIDOCAINE HCL (CARDIAC) 20 MG/ML IV SOLN
INTRAVENOUS | Status: DC | PRN
  Administered 2014-11-12: 80 mg via INTRAVENOUS

## 2014-11-12 MED ORDER — ACETAMINOPHEN 10 MG/ML IV SOLN
INTRAVENOUS | Status: DC | PRN
  Administered 2014-11-12: 1000 mg via INTRAVENOUS

## 2014-11-12 MED ORDER — OXYCODONE HCL 5 MG PO TABS
ORAL_TABLET | ORAL | Status: AC
  Filled 2014-11-12: qty 1

## 2014-11-12 MED ORDER — MIDAZOLAM HCL 5 MG/5ML IJ SOLN
INTRAMUSCULAR | Status: DC | PRN
  Administered 2014-11-12: 2 mg via INTRAVENOUS

## 2014-11-12 MED ORDER — IOHEXOL 300 MG/ML  SOLN
INTRAMUSCULAR | Status: DC | PRN
  Administered 2014-11-12: 26 mL via INTRAVENOUS

## 2014-11-12 MED ORDER — OXYCODONE HCL 5 MG PO TABS
5.0000 mg | ORAL_TABLET | Freq: Once | ORAL | Status: AC | PRN
  Administered 2014-11-12: 5 mg via ORAL
  Filled 2014-11-12: qty 1

## 2014-11-12 MED ORDER — DEXAMETHASONE SODIUM PHOSPHATE 4 MG/ML IJ SOLN
INTRAMUSCULAR | Status: DC | PRN
  Administered 2014-11-12: 10 mg via INTRAVENOUS

## 2014-11-12 MED ORDER — LACTATED RINGERS IV SOLN
INTRAVENOUS | Status: DC
  Administered 2014-11-12: 11:00:00 via INTRAVENOUS
  Filled 2014-11-12: qty 1000

## 2014-11-12 MED ORDER — HYDROMORPHONE HCL 1 MG/ML IJ SOLN
0.2500 mg | INTRAMUSCULAR | Status: DC | PRN
  Filled 2014-11-12: qty 1

## 2014-11-12 MED ORDER — SENNOSIDES-DOCUSATE SODIUM 8.6-50 MG PO TABS: 1.0000 | ORAL_TABLET | Freq: Two times a day (BID) | ORAL | Status: DC

## 2014-11-12 MED ORDER — FENTANYL CITRATE 0.05 MG/ML IJ SOLN
INTRAMUSCULAR | Status: AC
Start: 2014-11-12 — End: 2014-11-12
  Filled 2014-11-12: qty 4

## 2014-11-12 MED ORDER — PROPOFOL 10 MG/ML IV BOLUS
INTRAVENOUS | Status: DC | PRN
  Administered 2014-11-12: 100 mg via INTRAVENOUS
  Administered 2014-11-12: 200 mg via INTRAVENOUS

## 2014-11-12 MED ORDER — GENTAMICIN SULFATE 40 MG/ML IJ SOLN
5.0000 mg/kg | Freq: Once | INTRAVENOUS | Status: AC
  Administered 2014-11-12: 290 mg via INTRAVENOUS
  Filled 2014-11-12: qty 7.25

## 2014-11-12 MED ORDER — FENTANYL CITRATE 0.05 MG/ML IJ SOLN
INTRAMUSCULAR | Status: DC | PRN
  Administered 2014-11-12: 100 ug via INTRAVENOUS
  Administered 2014-11-12 (×2): 50 ug via INTRAVENOUS

## 2014-11-12 MED ORDER — PROMETHAZINE HCL 25 MG/ML IJ SOLN
6.2500 mg | INTRAMUSCULAR | Status: DC | PRN
  Filled 2014-11-12: qty 1

## 2014-11-12 MED ORDER — MIDAZOLAM HCL 2 MG/2ML IJ SOLN
INTRAMUSCULAR | Status: AC
  Filled 2014-11-12: qty 2

## 2014-11-12 MED ORDER — SODIUM CHLORIDE 0.9 % IR SOLN
Status: DC | PRN
  Administered 2014-11-12: 3000 mL

## 2014-11-12 MED ORDER — GENTAMICIN IN SALINE 1.6-0.9 MG/ML-% IV SOLN
80.0000 mg | INTRAVENOUS | Status: DC
  Filled 2014-11-12: qty 50

## 2014-11-12 SURGICAL SUPPLY — 19 items
BAG URO CATCHER STRL LF (DRAPE) ×2 IMPLANT
BASKET LASER NITINOL 1.9FR (BASKET) ×4 IMPLANT
BASKET ZERO TIP NITINOL 2.4FR (BASKET) IMPLANT
CATH INTERMIT  6FR 70CM (CATHETERS) ×2 IMPLANT
CLOTH BEACON ORANGE TIMEOUT ST (SAFETY) ×2 IMPLANT
DRAPE CAMERA CLOSED 9X96 (DRAPES) ×2 IMPLANT
FIBER LASER TRAC TIP (UROLOGICAL SUPPLIES) ×2 IMPLANT
GLOVE BIO SURGEON STRL SZ7.5 (GLOVE) ×2 IMPLANT
GLOVE SURG SS PI 7.5 STRL IVOR (GLOVE) ×4 IMPLANT
GOWN STRL REUS W/TWL LRG LVL3 (GOWN DISPOSABLE) ×4 IMPLANT
GUIDEWIRE ANG ZIPWIRE 038X150 (WIRE) ×2 IMPLANT
GUIDEWIRE STR DUAL SENSOR (WIRE) ×2 IMPLANT
IV NS IRRIG 3000ML ARTHROMATIC (IV SOLUTION) ×2 IMPLANT
NS IRRIG 500ML POUR BTL (IV SOLUTION) ×2 IMPLANT
PACK CYSTO (CUSTOM PROCEDURE TRAY) ×2 IMPLANT
SHEATH ACCESS URETERAL 24CM (SHEATH) ×2 IMPLANT
STENT POLARIS 5FRX24X.038 (STENTS) ×2 IMPLANT
SYRINGE 10CC LL (SYRINGE) ×2 IMPLANT
TUBE FEEDING 8FR 16IN STR KANG (MISCELLANEOUS) ×2 IMPLANT

## 2014-11-12 NOTE — Brief Op Note (Signed)
11/12/2014  12:40 PM  PATIENT:  [name]  53 y.o. female  PRE-OPERATIVE DIAGNOSIS:  RIGHT URETERAL AND RENAL STONES   POST-OPERATIVE DIAGNOSIS:  RIGHT URETERAL AND RENAL STONES   PROCEDURE:  Procedure(s) with comments: CYSTOSCOPY, Right Retrograde Pyelogram, Right Ureteroscopy, Basket Stone Extraction, Right Ureteral Stent (Right) -   [id number] HOLMIUM LASER APPLICATION (Right)  SURGEON:  Surgeon(s) and Role:    * Alexis Frock, MD - Primary  PHYSICIAN ASSISTANT:   ASSISTANTS: none   ANESTHESIA:   general  EBL:  Total I/O In: 200 [I.V.:200] Out: -   BLOOD ADMINISTERED:none  DRAINS: none   LOCAL MEDICATIONS USED:  NONE  SPECIMEN:  Source of Specimen:  Rt renal stone fragments  DISPOSITION OF SPECIMEN:  [facility] for Compositional Analysis  COUNTS:  YES  TOURNIQUET:  * No tourniquets in log *  DICTATION: .Other Dictation: Dictation Number [id number]  PLAN OF CARE: Discharge to home after PACU  PATIENT DISPOSITION:  PACU - hemodynamically stable.   Delay start of Pharmacological VTE agent (>24hrs) due to surgical blood loss or risk of bleeding: yes

## 2014-11-12 NOTE — H&P (Signed)
[name] is an 53 y.o. female.    Chief Complaint: Pre-op Left Ureteroscopic Stone Manipulation  HPI:   1 - Nephrolithiasis - Rt 31mm UVJ stone by CT 10/2014 with mild hydro on eval colickly flank pain and urinary urgency. SSD 14cm. Also several ipsilateral renal stones, total volume 3mm.   2 - Left Adrenal Adenoma - incidental 1.7cm left adrenal mass on stone CT 10/2014. No refractory HTN. Recent serum adrenal studies normal.    Today [name] is seen to proceed with left ureeroscopic stone manipulation, the thinks she may have passed a small fragment but wants to proceed with goal of stone free. No interval fevers.   Past Medical History  Diagnosis Date  . Anxiety   . Headache(784.0)   . Major depression   . Right ureteral stone   . Renal calculus, right   . PTSD (post-traumatic stress disorder)   . Alcohol dependence     recent IVC at [facility] 10/ 2015 by son--  11-07-2014 pt states no alcohol since 09-11-2014 (approx)  . GERD (gastroesophageal reflux disease)   . Frequency of urination   . Urgency of urination   . Hypertension     no meds per pt,  states bp been ok     Past Surgical History  Procedure Laterality Date  . Cesarean section  1991  . Robotic assisted total hysterectomy N/A 03/19/2014    Procedure: ROBOTIC ASSISTED TOTAL HYSTERECTOMY;  Surgeon: Lovenia Kim, MD;  Location: [facility];  Service: Gynecology;  Laterality: N/A;  . Bilateral salpingectomy Bilateral 03/19/2014    Procedure: BILATERAL SALPINGECTOMY;  Surgeon: Lovenia Kim, MD;  Location: [facility];  Service: Gynecology;  Laterality: Bilateral;  . 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 Alcohol abuse Father    Social History:  reports that she quit smoking about 2 months ago. Her smoking use included Cigarettes. She smoked 0.00 packs per day. She has never used smokeless tobacco. She reports  that she drinks about 12.0 oz of alcohol per week. She reports that she uses illicit drugs (Marijuana and Benzodiazepines).  Allergies: No Known Allergies  No prescriptions prior to admission    No results found for this or any previous visit (from the past 48 hour(s)). No results found.  Review of Systems  Constitutional: Negative.  Negative for fever and chills.  HENT: Negative.   Eyes: Negative.   Respiratory: Negative.   Cardiovascular: Negative.   Gastrointestinal: Negative.   Genitourinary: Negative.   Musculoskeletal: Negative.   Skin: Negative.   Neurological: Negative.   Endo/Heme/Allergies: Negative.   Psychiatric/Behavioral: Negative.     Height 5\' 1"  (1.549 m), weight 74.39 kg (164 lb), last menstrual period 02/09/2014. Physical Exam  Constitutional: She appears well-developed.  HENT:  Head: Normocephalic.  Eyes: Pupils are equal, round, and reactive to light.  Neck: Normal range of motion.  Cardiovascular: Normal rate.   Respiratory: Effort normal.  GI: Soft.  Genitourinary:  No CVAT at present  Musculoskeletal: Normal range of motion.  Neurological: She is alert.  Skin: Skin is warm.  Psychiatric: She has a normal mood and affect. Her behavior is normal. Thought content normal.     Assessment/Plan    1 - Nephrolithiasis - We rediscussed ureteroscopic stone manipulation with basketing and laser-lithotripsy in detail.  We rediscussed risks including bleeding, infection, damage to kidney / ureter  bladder, rarely loss of kidney. We rediscussed anesthetic risks and rare but serious surgical complications including DVT, PE, MI, and mortality. We specifically readdressed that in 5-10% of cases a staged approach is required with stenting followed by re-attempt ureteroscopy if anatomy unfavorable. The patient voiced understanding and wises to proceed.   2 - Left Adrenal Adenoma - non-functional, as small size, no indication for further evaluation.    [name],  [name] 11/12/2014, 6:14 AM

## 2014-11-12 NOTE — Discharge Instructions (Signed)
1 - You may have urinary urgency (bladder spasms) and bloody urine on / off with stent in place. This is normal.  2 - Pull tethered stent on Monday morning at home by pulling on string, then blue/white plastic tubing, and discarding.   3 - Call MD or go to ER for fever >102, severe pain / nausea / vomiting not relieved by medications, or acute change in medical status

## 2014-11-12 NOTE — Anesthesia Preprocedure Evaluation (Signed)
Anesthesia Evaluation  Patient identified by MRN, date of birth, ID band Patient awake    Reviewed: Allergy & Precautions, H&P , NPO status , Patient's Chart, lab work & pertinent test results, reviewed documented beta blocker date and time   History of Anesthesia Complications Negative for: history of anesthetic complications  Airway Mallampati: II  TM Distance: >3 FB Neck ROM: full    Dental  (+) Teeth Intact   Pulmonary Current Smoker, former smoker,  breath sounds clear to auscultation  Pulmonary exam normal       Cardiovascular Exercise Tolerance: Good hypertension, Rhythm:regular Rate:Normal     Neuro/Psych  Headaches (5x/month), PSYCHIATRIC DISORDERS Anxiety Depression    GI/Hepatic Neg liver ROS, GERD-  Medicated and Controlled,  Endo/Other  Obesity  Renal/GU Renal disease  Female GU complaint (Uterine Fibroids)     Musculoskeletal negative musculoskeletal ROS (+)   Abdominal   Peds  Hematology negative hematology ROS (+)   Anesthesia Other Findings   Reproductive/Obstetrics negative OB ROS                             Anesthesia Physical  Anesthesia Plan  ASA: II  Anesthesia Plan: General   Post-op Pain Management:    Induction: Intravenous  Airway Management Planned: LMA  Additional Equipment:   Intra-op Plan:   Post-operative Plan: Extubation in OR  Informed Consent: I have reviewed the patients History and Physical, chart, labs and discussed the procedure including the risks, benefits and alternatives for the proposed anesthesia with the patient or authorized representative who has indicated his/her understanding and acceptance.   Dental advisory given  Plan Discussed with: CRNA  Anesthesia Plan Comments:         Anesthesia Quick Evaluation

## 2014-11-12 NOTE — Anesthesia Procedure Notes (Signed)
Procedure Name: LMA Insertion Date/Time: 11/12/2014 11:34 AM Performed by: Bethena Roys T Pre-anesthesia Checklist: Patient identified, Emergency Drugs available, Suction available and Patient being monitored Patient Re-evaluated:Patient Re-evaluated prior to inductionOxygen Delivery Method: Circle System Utilized Preoxygenation: Pre-oxygenation with 100% oxygen Intubation Type: IV induction Ventilation: Mask ventilation without difficulty LMA: LMA inserted LMA Size: 4.0 Number of attempts: 1 Airway Equipment and Method: bite block Placement Confirmation: positive ETCO2 Dental Injury: Teeth and Oropharynx as per pre-operative assessment

## 2014-11-12 NOTE — Transfer of Care (Signed)
Immediate Anesthesia Transfer of Care Note  Patient: [name]  Procedure(s) Performed: Procedure(s) with comments: CYSTOSCOPY, Right Retrograde Pyelogram, Right Ureteroscopy, Basket Stone Extraction, Right Ureteral Stent (Right) -   [id number] HOLMIUM LASER APPLICATION (Right)  Patient Location: PACU  Anesthesia Type:General  Level of Consciousness: awake, alert  and oriented  Airway & Oxygen Therapy: Patient Spontanous Breathing and Patient connected to nasal cannula oxygen  Post-op Assessment: Report given to PACU RN  Post vital signs: Reviewed and stable  Complications: No apparent anesthesia complications

## 2014-11-13 ENCOUNTER — Encounter (HOSPITAL_BASED_OUTPATIENT_CLINIC_OR_DEPARTMENT_OTHER): Payer: Self-pay | Admitting: Urology

## 2014-11-13 NOTE — Anesthesia Postprocedure Evaluation (Signed)
Anesthesia Post Note  Patient: [name]  Procedure(s) Performed: Procedure(s) (LRB): CYSTOSCOPY, Right Retrograde Pyelogram, Right Ureteroscopy, Basket Stone Extraction, Right Ureteral Stent (Right) HOLMIUM LASER APPLICATION (Right)  Anesthesia type: General  Patient location: PACU  Post pain: Pain level controlled  Post assessment: Post-op Vital signs reviewed  Last Vitals: BP 118/74 mmHg  Pulse 76  Temp(Src) 36.8 C (Oral)  Resp 14  Ht 5\' 1"  (1.549 m)  Wt 162 lb (73.483 kg)  BMI 30.63 kg/m2  SpO2 97%  LMP 02/09/2014  Post vital signs: Reviewed  Level of consciousness: sedated  Complications: No apparent anesthesia complications

## 2014-11-14 NOTE — Op Note (Signed)
[name], [name]                 ACCOUNT NO.:  [id number]  MEDICAL RECORD NO.:  [id number]  LOCATION:                                 FACILITY:  PHYSICIAN:  Alexis Frock, MD     DATE OF BIRTH:  [birth year]  DATE OF PROCEDURE:  11/12/2014                               OPERATIVE REPORT   PREOPERATIVE DIAGNOSES:  Right renal and ureteral stones.  PROCEDURE: 1. Cystoscopy with right retrograde pyelogram interpretation. 2. Right ureteroscopy with laser lithotripsy. 3. Insertion of right ureteral stent 5 x 24 Polaris with tether to the     thigh.  ESTIMATED BLOOD LOSS:  Nil.  COMPLICATIONS:  None.  SPECIMENS:  Right renal stone fragments for compositional analysis.  FINDINGS: 1. No evidence of ureterolithiasis. 2. Multifocal right renal stones, largest with a free-floating     approximately 9 mm stone in the renal pelvis. 3. Complete removal of all stone fragments larger than 1/3rd mm by     ureteroscopy with extraction. 4. Distal placement of right ureteral stent, proximal curl renal     pelvis, distal urinary bladder.  INDICATIONS:  [name] is a pleasant, 53 year old lady with recent history of right renal colic due to a right ureteral stone.  She also has several right renal stones that are much larger as well.  She underwent a trial of medical therapy and actually subsequently passed a small ureteral stone; however, she adamantly wished to proceed with right ureteroscopic stone manipulation to address her right renal stones to prevent future colic.  Informed consent was obtained and placed in medical record.  PROCEDURE IN DETAIL:  The patient being [name] was verified. Procedure being right ureteroscopic stone manipulation was confirmed. Procedure was carried out.  Time-out was performed.  Intravenous antibiotics were administered.  General LMA anesthesia was introduced. The patient was placed into a low lithotomy position and sterile field was created by  prepping and draping the patient's vagina, introitus, and proximal thighs using iodine x3.  Next, cystourethroscopy was performed using a 22-French rigid cystoscope with 12-degree offset lens. Inspection of the urinary bladder revealed no diverticula, calcifications, papular lesions.  Ureteral orifices were in the normal anatomic position bilaterally and appeared singleton.  The right ureteral orifice was cannulated with a 6-French end-hole catheter and right retrograde pyelogram was obtained.  Right retrograde pyelogram demonstrated a single right ureter with single-system right kidney.  No filling defects or narrowing noted.  A 0.038 Glidewire was advanced at the level of the upper pole and set aside as a safety wire.  A 61F feeding tube was placed in urinary bladder for pressure release.  Next, semi-rigid ureteroscopy was performed in the entire length of the right ureter alongside a separate Sensor working wire.  No mucosal abnormalities.  Calcifications were seen in the right ureter.  The semi-rigid ureteroscope was then exchanged with a 12/14, 24 cm ureteral access sheath over the Sensor working wire to the level of proximal ureter.  Next, flexible digital ureteroscopy was performed using 8-French digital ureteroscope, which allowed inspection of proximal ureter and systematic inspection of the right kidney.  There was a relatively large free-floating stone in the  renal pelvis consistent with known prior 9 mm stone.  This was repositioned into an upper pole calyx and holmium laser energy was applied to the stone using settings of 0.2 joules and 20 hertz ablating the stone approximately 50% using a dusting technique and the rest of the stone was fragmented into pieces, approximately 2-3 mm.  These were then grasped with an escape-type basket and brought out in their entirety and set aside for compositional analysis.  There were several other small punctate calcifications which were  addressed with the escape basket only.  Following this there was complete resolution of all accessible stone fragments larger than 1/3rd mm.  There was no evidence of any renal perforation. Hemostasis appeared excellent.  Given the relatively large stone, it was felt that a period of stenting was warranted for several days to allow for ureteral edema to resolve.  The sheath was removed under continuous ureteroscopic vision.  No mucosal abnormalities were found.  Finally, a new 5 x 24 Polaris type stent was placed over the remaining safety wire using a fluoroscopic guidance.  Good proximal and distal deployments were noted.  The tether was left in place and fashioned to the thigh. Procedure was then terminated.  The patient tolerated the procedure well.  There were no immediate periprocedural complications.  The patient was taken to postanesthesia care unit in stable condition.          ______________________________ Alexis Frock, MD     TM/MEDQ  D:  11/12/2014  T:  11/12/2014  Job:  [id number]

## 2016-05-25 ENCOUNTER — Other Ambulatory Visit: Payer: Self-pay | Admitting: Obstetrics and Gynecology

## 2016-05-25 DIAGNOSIS — Z1231 Encounter for screening mammogram for malignant neoplasm of breast: Secondary | ICD-10-CM

## 2016-06-29 ENCOUNTER — Ambulatory Visit: Payer: Self-pay

## 2016-07-05 ENCOUNTER — Ambulatory Visit: Payer: Self-pay

## 2016-09-03 ENCOUNTER — Emergency Department (HOSPITAL_COMMUNITY): Payer: BLUE CROSS/BLUE SHIELD

## 2016-09-03 ENCOUNTER — Encounter (HOSPITAL_COMMUNITY): Payer: Self-pay | Admitting: Emergency Medicine

## 2016-09-03 ENCOUNTER — Emergency Department (HOSPITAL_COMMUNITY)
Admission: EM | Admit: 2016-09-03 | Discharge: 2016-09-03 | Disposition: A | Discharge status: Home / Self Care | Payer: BLUE CROSS/BLUE SHIELD | Attending: Emergency Medicine | Admitting: Emergency Medicine

## 2016-09-03 DIAGNOSIS — Z87891 Personal history of nicotine dependence: Secondary | ICD-10-CM | POA: Diagnosis not present

## 2016-09-03 DIAGNOSIS — Y999 Unspecified external cause status: Secondary | ICD-10-CM | POA: Insufficient documentation

## 2016-09-03 DIAGNOSIS — Y939 Activity, unspecified: Secondary | ICD-10-CM | POA: Diagnosis not present

## 2016-09-03 DIAGNOSIS — Y92410 Unspecified street and highway as the place of occurrence of the external cause: Secondary | ICD-10-CM | POA: Diagnosis not present

## 2016-09-03 DIAGNOSIS — S59911A Unspecified injury of right forearm, initial encounter: Secondary | ICD-10-CM | POA: Diagnosis present

## 2016-09-03 DIAGNOSIS — S8991XA Unspecified injury of right lower leg, initial encounter: Secondary | ICD-10-CM | POA: Insufficient documentation

## 2016-09-03 DIAGNOSIS — R51 Headache: Secondary | ICD-10-CM | POA: Diagnosis not present

## 2016-09-03 DIAGNOSIS — I10 Essential (primary) hypertension: Secondary | ICD-10-CM | POA: Insufficient documentation

## 2016-09-03 DIAGNOSIS — R109 Unspecified abdominal pain: Secondary | ICD-10-CM | POA: Diagnosis not present

## 2016-09-03 DIAGNOSIS — S8992XA Unspecified injury of left lower leg, initial encounter: Secondary | ICD-10-CM | POA: Insufficient documentation

## 2016-09-03 LAB — CBC
HCT: 46.1 % — ABNORMAL HIGH (ref 36.0–46.0)
Hemoglobin: 15.6 g/dL — ABNORMAL HIGH (ref 12.0–15.0)
MCH: 32.4 pg (ref 26.0–34.0)
MCHC: 33.8 g/dL (ref 30.0–36.0)
MCV: 95.8 fL (ref 78.0–100.0)
PLATELETS: 193 10*3/uL (ref 150–400)
RBC: 4.81 MIL/uL (ref 3.87–5.11)
RDW: 12.3 % (ref 11.5–15.5)
WBC: 8.4 10*3/uL (ref 4.0–10.5)

## 2016-09-03 LAB — I-STAT CHEM 8, ED
BUN: 20 mg/dL (ref 6–20)
CALCIUM ION: 1.08 mmol/L — AB (ref 1.15–1.40)
Chloride: 101 mmol/L (ref 101–111)
Creatinine, Ser: 0.6 mg/dL (ref 0.44–1.00)
Glucose, Bld: 101 mg/dL — ABNORMAL HIGH (ref 65–99)
HCT: 48 % — ABNORMAL HIGH (ref 36.0–46.0)
HEMOGLOBIN: 16.3 g/dL — AB (ref 12.0–15.0)
Potassium: 3.4 mmol/L — ABNORMAL LOW (ref 3.5–5.1)
Sodium: 136 mmol/L (ref 135–145)
TCO2: 23 mmol/L (ref 0–100)

## 2016-09-03 MED ORDER — IOPAMIDOL (ISOVUE-300) INJECTION 61%
INTRAVENOUS | Status: AC
  Administered 2016-09-03: 100 mL
  Filled 2016-09-03: qty 100

## 2016-09-03 MED ORDER — MORPHINE SULFATE (PF) 4 MG/ML IV SOLN
4.0000 mg | Freq: Once | INTRAVENOUS | Status: AC
  Administered 2016-09-03: 4 mg via INTRAVENOUS
  Filled 2016-09-03: qty 1

## 2016-09-03 NOTE — ED Notes (Signed)
Pt c/o being too hot and uncomfortable  On the bedpain
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
## 2016-09-03 NOTE — ED Notes (Signed)
Patient transported to X-ray 

## 2016-09-03 NOTE — ED Notes (Signed)
Pain med given 
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
## 2016-09-03 NOTE — Discharge Instructions (Signed)
You will hurt worse tomorrow.  If you continue to have pain in 1 week follow up with your PCP.

## 2017-05-08 ENCOUNTER — Ambulatory Visit: Payer: BLUE CROSS/BLUE SHIELD | Admitting: Neurology

## 2017-05-29 ENCOUNTER — Ambulatory Visit: Payer: BLUE CROSS/BLUE SHIELD | Admitting: Neurology

## 2017-07-03 ENCOUNTER — Ambulatory Visit: Payer: BLUE CROSS/BLUE SHIELD | Admitting: Neurology

## 2017-08-21 ENCOUNTER — Ambulatory Visit: Payer: BLUE CROSS/BLUE SHIELD | Admitting: Neurology

## 2017-12-07 DIAGNOSIS — M545 Low back pain, unspecified: Secondary | ICD-10-CM | POA: Insufficient documentation

## 2017-12-07 DIAGNOSIS — Z79899 Other long term (current) drug therapy: Secondary | ICD-10-CM | POA: Insufficient documentation

## 2017-12-07 DIAGNOSIS — G8929 Other chronic pain: Secondary | ICD-10-CM | POA: Insufficient documentation

## 2018-05-10 DIAGNOSIS — M5136 Other intervertebral disc degeneration, lumbar region: Secondary | ICD-10-CM | POA: Insufficient documentation

## 2019-04-12 DIAGNOSIS — G894 Chronic pain syndrome: Secondary | ICD-10-CM | POA: Diagnosis not present

## 2019-04-12 DIAGNOSIS — M545 Low back pain: Secondary | ICD-10-CM | POA: Diagnosis not present

## 2019-04-30 ENCOUNTER — Other Ambulatory Visit: Payer: Self-pay | Admitting: Family Medicine

## 2019-04-30 DIAGNOSIS — F4322 Adjustment disorder with anxiety: Secondary | ICD-10-CM | POA: Diagnosis not present

## 2019-04-30 DIAGNOSIS — Z1231 Encounter for screening mammogram for malignant neoplasm of breast: Secondary | ICD-10-CM

## 2019-05-02 ENCOUNTER — Ambulatory Visit
Admission: RE | Admit: 2019-05-02 | Discharge: 2019-05-02 | Disposition: A | Discharge status: Home / Self Care | Payer: Medicare Other | Source: Ambulatory Visit | Attending: Family Medicine | Admitting: Family Medicine

## 2019-05-02 ENCOUNTER — Other Ambulatory Visit: Payer: Self-pay

## 2019-05-02 DIAGNOSIS — Z1231 Encounter for screening mammogram for malignant neoplasm of breast: Secondary | ICD-10-CM | POA: Diagnosis not present

## 2019-05-06 DIAGNOSIS — Z124 Encounter for screening for malignant neoplasm of cervix: Secondary | ICD-10-CM | POA: Diagnosis not present

## 2019-05-06 DIAGNOSIS — N951 Menopausal and female climacteric states: Secondary | ICD-10-CM | POA: Diagnosis not present

## 2019-05-06 DIAGNOSIS — Z79890 Hormone replacement therapy (postmenopausal): Secondary | ICD-10-CM | POA: Diagnosis not present

## 2019-05-20 DIAGNOSIS — R7303 Prediabetes: Secondary | ICD-10-CM | POA: Diagnosis not present

## 2019-05-20 DIAGNOSIS — E7800 Pure hypercholesterolemia, unspecified: Secondary | ICD-10-CM | POA: Diagnosis not present

## 2019-08-13 DIAGNOSIS — Z79899 Other long term (current) drug therapy: Secondary | ICD-10-CM | POA: Diagnosis not present

## 2019-08-13 DIAGNOSIS — Z5181 Encounter for therapeutic drug level monitoring: Secondary | ICD-10-CM | POA: Diagnosis not present

## 2019-08-19 DIAGNOSIS — F332 Major depressive disorder, recurrent severe without psychotic features: Secondary | ICD-10-CM | POA: Diagnosis not present

## 2019-09-12 DIAGNOSIS — B349 Viral infection, unspecified: Secondary | ICD-10-CM | POA: Diagnosis not present

## 2019-09-19 DIAGNOSIS — Z23 Encounter for immunization: Secondary | ICD-10-CM | POA: Diagnosis not present

## 2019-09-19 DIAGNOSIS — R109 Unspecified abdominal pain: Secondary | ICD-10-CM | POA: Diagnosis not present

## 2019-10-01 DIAGNOSIS — R945 Abnormal results of liver function studies: Secondary | ICD-10-CM | POA: Diagnosis not present

## 2019-10-01 DIAGNOSIS — R7989 Other specified abnormal findings of blood chemistry: Secondary | ICD-10-CM | POA: Diagnosis not present

## 2019-10-01 DIAGNOSIS — R748 Abnormal levels of other serum enzymes: Secondary | ICD-10-CM | POA: Diagnosis not present

## 2019-12-12 DIAGNOSIS — Z79899 Other long term (current) drug therapy: Secondary | ICD-10-CM | POA: Diagnosis not present

## 2019-12-12 DIAGNOSIS — G894 Chronic pain syndrome: Secondary | ICD-10-CM | POA: Diagnosis not present

## 2020-03-27 ENCOUNTER — Other Ambulatory Visit: Payer: Self-pay | Admitting: Family Medicine

## 2020-03-27 DIAGNOSIS — Z1231 Encounter for screening mammogram for malignant neoplasm of breast: Secondary | ICD-10-CM

## 2020-04-08 DIAGNOSIS — G894 Chronic pain syndrome: Secondary | ICD-10-CM | POA: Diagnosis not present

## 2020-04-08 DIAGNOSIS — M5136 Other intervertebral disc degeneration, lumbar region: Secondary | ICD-10-CM | POA: Diagnosis not present

## 2020-04-08 DIAGNOSIS — M545 Low back pain: Secondary | ICD-10-CM | POA: Diagnosis not present

## 2020-04-08 DIAGNOSIS — Z79891 Long term (current) use of opiate analgesic: Secondary | ICD-10-CM | POA: Diagnosis not present

## 2020-05-05 ENCOUNTER — Ambulatory Visit: Payer: Medicare Other

## 2020-05-25 DIAGNOSIS — E782 Mixed hyperlipidemia: Secondary | ICD-10-CM | POA: Diagnosis not present

## 2020-05-25 DIAGNOSIS — I10 Essential (primary) hypertension: Secondary | ICD-10-CM | POA: Diagnosis not present

## 2020-05-25 DIAGNOSIS — E7800 Pure hypercholesterolemia, unspecified: Secondary | ICD-10-CM | POA: Diagnosis not present

## 2020-05-25 DIAGNOSIS — R7303 Prediabetes: Secondary | ICD-10-CM | POA: Diagnosis not present

## 2020-05-28 DIAGNOSIS — E782 Mixed hyperlipidemia: Secondary | ICD-10-CM | POA: Diagnosis not present

## 2020-05-28 DIAGNOSIS — M5090 Cervical disc disorder, unspecified, unspecified cervical region: Secondary | ICD-10-CM | POA: Diagnosis not present

## 2020-05-28 DIAGNOSIS — G43909 Migraine, unspecified, not intractable, without status migrainosus: Secondary | ICD-10-CM | POA: Diagnosis not present

## 2020-05-28 DIAGNOSIS — I10 Essential (primary) hypertension: Secondary | ICD-10-CM | POA: Diagnosis not present

## 2020-05-28 DIAGNOSIS — F419 Anxiety disorder, unspecified: Secondary | ICD-10-CM | POA: Diagnosis not present

## 2020-05-28 DIAGNOSIS — R7303 Prediabetes: Secondary | ICD-10-CM | POA: Diagnosis not present

## 2020-05-28 DIAGNOSIS — G479 Sleep disorder, unspecified: Secondary | ICD-10-CM | POA: Diagnosis not present

## 2020-06-21 ENCOUNTER — Emergency Department (HOSPITAL_COMMUNITY)
Admission: EM | Admit: 2020-06-21 | Discharge: 2020-06-22 | Disposition: A | Discharge status: Home / Self Care | Payer: Medicare HMO | Attending: Emergency Medicine | Admitting: Emergency Medicine

## 2020-06-21 ENCOUNTER — Other Ambulatory Visit: Payer: Self-pay

## 2020-06-21 ENCOUNTER — Emergency Department (HOSPITAL_COMMUNITY): Payer: Medicare HMO

## 2020-06-21 ENCOUNTER — Encounter (HOSPITAL_COMMUNITY): Payer: Self-pay

## 2020-06-21 DIAGNOSIS — Y999 Unspecified external cause status: Secondary | ICD-10-CM | POA: Diagnosis not present

## 2020-06-21 DIAGNOSIS — S2232XA Fracture of one rib, left side, initial encounter for closed fracture: Secondary | ICD-10-CM | POA: Insufficient documentation

## 2020-06-21 DIAGNOSIS — R0789 Other chest pain: Secondary | ICD-10-CM | POA: Diagnosis not present

## 2020-06-21 DIAGNOSIS — Z87891 Personal history of nicotine dependence: Secondary | ICD-10-CM | POA: Diagnosis not present

## 2020-06-21 DIAGNOSIS — I10 Essential (primary) hypertension: Secondary | ICD-10-CM | POA: Insufficient documentation

## 2020-06-21 DIAGNOSIS — Y9289 Other specified places as the place of occurrence of the external cause: Secondary | ICD-10-CM | POA: Diagnosis not present

## 2020-06-21 DIAGNOSIS — W010XXA Fall on same level from slipping, tripping and stumbling without subsequent striking against object, initial encounter: Secondary | ICD-10-CM | POA: Diagnosis not present

## 2020-06-21 DIAGNOSIS — Y939 Activity, unspecified: Secondary | ICD-10-CM | POA: Diagnosis not present

## 2020-06-21 DIAGNOSIS — S299XXA Unspecified injury of thorax, initial encounter: Secondary | ICD-10-CM | POA: Diagnosis not present

## 2020-06-21 MED ORDER — OXYCODONE-ACETAMINOPHEN 5-325 MG PO TABS
1.0000 | ORAL_TABLET | ORAL | Status: DC | PRN
  Administered 2020-06-21: 1 via ORAL
  Filled 2020-06-21: qty 1
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
## 2020-06-22 MED ORDER — MELOXICAM 15 MG PO TABS: 15.0000 mg | ORAL_TABLET | Freq: Every day | ORAL | 0 refills | Status: DC

## 2020-06-22 MED ORDER — OXYCODONE-ACETAMINOPHEN 5-325 MG PO TABS
2.0000 | ORAL_TABLET | Freq: Once | ORAL | Status: AC
  Administered 2020-06-22: 2 via ORAL
  Filled 2020-06-22: qty 2

## 2020-06-22 MED ORDER — KETOROLAC TROMETHAMINE 60 MG/2ML IM SOLN
60.0000 mg | Freq: Once | INTRAMUSCULAR | Status: AC
  Administered 2020-06-22: 60 mg via INTRAMUSCULAR
  Filled 2020-06-22: qty 2

## 2020-06-22 NOTE — Discharge Instructions (Addendum)
I am prescribing you an anti-inflammatory which can cause worsening acid reflux and ulcers.  If you start noticing that then please switch to ibuprofen as it has less of an effect.  If you start having any kind of dark stools or vomiting blood please stop the medication and seek medical attention.
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
## 2020-07-28 ENCOUNTER — Other Ambulatory Visit: Payer: Self-pay

## 2020-07-28 ENCOUNTER — Ambulatory Visit
Admission: RE | Admit: 2020-07-28 | Discharge: 2020-07-28 | Disposition: A | Discharge status: Home / Self Care | Payer: Medicare HMO | Source: Ambulatory Visit | Attending: Family Medicine | Admitting: Family Medicine

## 2020-07-28 DIAGNOSIS — Z1231 Encounter for screening mammogram for malignant neoplasm of breast: Secondary | ICD-10-CM

## 2020-07-30 ENCOUNTER — Other Ambulatory Visit: Payer: Self-pay | Admitting: Family Medicine

## 2020-07-30 DIAGNOSIS — R928 Other abnormal and inconclusive findings on diagnostic imaging of breast: Secondary | ICD-10-CM

## 2020-08-05 DIAGNOSIS — Z79899 Other long term (current) drug therapy: Secondary | ICD-10-CM | POA: Diagnosis not present

## 2020-08-07 ENCOUNTER — Other Ambulatory Visit: Payer: Self-pay | Admitting: Family Medicine

## 2020-08-07 ENCOUNTER — Ambulatory Visit
Admission: RE | Admit: 2020-08-07 | Discharge: 2020-08-07 | Disposition: A | Discharge status: Home / Self Care | Payer: Medicare HMO | Source: Ambulatory Visit | Attending: Family Medicine | Admitting: Family Medicine

## 2020-08-07 ENCOUNTER — Other Ambulatory Visit: Payer: Self-pay

## 2020-08-07 DIAGNOSIS — R928 Other abnormal and inconclusive findings on diagnostic imaging of breast: Secondary | ICD-10-CM

## 2020-08-07 DIAGNOSIS — N6320 Unspecified lump in the left breast, unspecified quadrant: Secondary | ICD-10-CM

## 2020-08-07 DIAGNOSIS — N6489 Other specified disorders of breast: Secondary | ICD-10-CM | POA: Diagnosis not present

## 2020-08-19 ENCOUNTER — Other Ambulatory Visit: Payer: Self-pay

## 2020-08-19 ENCOUNTER — Ambulatory Visit
Admission: RE | Admit: 2020-08-19 | Discharge: 2020-08-19 | Disposition: A | Discharge status: Home / Self Care | Payer: Medicare HMO | Source: Ambulatory Visit | Attending: Family Medicine | Admitting: Family Medicine

## 2020-08-19 DIAGNOSIS — N6321 Unspecified lump in the left breast, upper outer quadrant: Secondary | ICD-10-CM | POA: Diagnosis not present

## 2020-08-19 DIAGNOSIS — N6320 Unspecified lump in the left breast, unspecified quadrant: Secondary | ICD-10-CM

## 2020-08-19 DIAGNOSIS — N6012 Diffuse cystic mastopathy of left breast: Secondary | ICD-10-CM | POA: Diagnosis not present

## 2020-08-26 DIAGNOSIS — E782 Mixed hyperlipidemia: Secondary | ICD-10-CM | POA: Diagnosis not present

## 2020-08-26 DIAGNOSIS — G43909 Migraine, unspecified, not intractable, without status migrainosus: Secondary | ICD-10-CM | POA: Diagnosis not present

## 2020-08-26 DIAGNOSIS — M5090 Cervical disc disorder, unspecified, unspecified cervical region: Secondary | ICD-10-CM | POA: Diagnosis not present

## 2020-08-26 DIAGNOSIS — F419 Anxiety disorder, unspecified: Secondary | ICD-10-CM | POA: Diagnosis not present

## 2020-08-26 DIAGNOSIS — G479 Sleep disorder, unspecified: Secondary | ICD-10-CM | POA: Diagnosis not present

## 2020-08-26 DIAGNOSIS — R7303 Prediabetes: Secondary | ICD-10-CM | POA: Diagnosis not present

## 2020-08-26 DIAGNOSIS — I10 Essential (primary) hypertension: Secondary | ICD-10-CM | POA: Diagnosis not present

## 2020-10-01 DIAGNOSIS — M5416 Radiculopathy, lumbar region: Secondary | ICD-10-CM | POA: Diagnosis not present

## 2020-10-01 DIAGNOSIS — G894 Chronic pain syndrome: Secondary | ICD-10-CM | POA: Diagnosis not present

## 2021-01-06 DIAGNOSIS — G894 Chronic pain syndrome: Secondary | ICD-10-CM | POA: Diagnosis not present

## 2021-01-20 DIAGNOSIS — E782 Mixed hyperlipidemia: Secondary | ICD-10-CM | POA: Diagnosis not present

## 2021-01-20 DIAGNOSIS — G43909 Migraine, unspecified, not intractable, without status migrainosus: Secondary | ICD-10-CM | POA: Diagnosis not present

## 2021-01-20 DIAGNOSIS — I10 Essential (primary) hypertension: Secondary | ICD-10-CM | POA: Diagnosis not present

## 2021-04-05 DIAGNOSIS — M5459 Other low back pain: Secondary | ICD-10-CM | POA: Diagnosis not present

## 2021-04-05 DIAGNOSIS — M5136 Other intervertebral disc degeneration, lumbar region: Secondary | ICD-10-CM | POA: Diagnosis not present

## 2021-04-05 DIAGNOSIS — G894 Chronic pain syndrome: Secondary | ICD-10-CM | POA: Diagnosis not present

## 2021-07-05 DIAGNOSIS — M5459 Other low back pain: Secondary | ICD-10-CM | POA: Diagnosis not present

## 2021-10-07 ENCOUNTER — Emergency Department (HOSPITAL_COMMUNITY): Payer: Medicare HMO

## 2021-10-07 ENCOUNTER — Encounter (HOSPITAL_COMMUNITY): Payer: Self-pay

## 2021-10-07 ENCOUNTER — Emergency Department (HOSPITAL_COMMUNITY)
Admission: EM | Admit: 2021-10-07 | Discharge: 2021-10-07 | Disposition: A | Discharge status: Home / Self Care | Payer: Medicare HMO | Attending: Emergency Medicine | Admitting: Emergency Medicine

## 2021-10-07 DIAGNOSIS — S8011XA Contusion of right lower leg, initial encounter: Secondary | ICD-10-CM | POA: Insufficient documentation

## 2021-10-07 DIAGNOSIS — K219 Gastro-esophageal reflux disease without esophagitis: Secondary | ICD-10-CM | POA: Diagnosis not present

## 2021-10-07 DIAGNOSIS — F10129 Alcohol abuse with intoxication, unspecified: Secondary | ICD-10-CM | POA: Diagnosis not present

## 2021-10-07 DIAGNOSIS — W108XXA Fall (on) (from) other stairs and steps, initial encounter: Secondary | ICD-10-CM | POA: Diagnosis not present

## 2021-10-07 DIAGNOSIS — R1013 Epigastric pain: Secondary | ICD-10-CM | POA: Insufficient documentation

## 2021-10-07 DIAGNOSIS — I10 Essential (primary) hypertension: Secondary | ICD-10-CM | POA: Insufficient documentation

## 2021-10-07 DIAGNOSIS — S61222A Laceration with foreign body of right middle finger without damage to nail, initial encounter: Secondary | ICD-10-CM | POA: Diagnosis not present

## 2021-10-07 DIAGNOSIS — R1012 Left upper quadrant pain: Secondary | ICD-10-CM | POA: Diagnosis not present

## 2021-10-07 DIAGNOSIS — S0990XA Unspecified injury of head, initial encounter: Secondary | ICD-10-CM | POA: Insufficient documentation

## 2021-10-07 DIAGNOSIS — R296 Repeated falls: Secondary | ICD-10-CM | POA: Insufficient documentation

## 2021-10-07 DIAGNOSIS — R609 Edema, unspecified: Secondary | ICD-10-CM | POA: Diagnosis not present

## 2021-10-07 DIAGNOSIS — F10120 Alcohol abuse with intoxication, uncomplicated: Secondary | ICD-10-CM | POA: Diagnosis not present

## 2021-10-07 DIAGNOSIS — Y92009 Unspecified place in unspecified non-institutional (private) residence as the place of occurrence of the external cause: Secondary | ICD-10-CM | POA: Diagnosis not present

## 2021-10-07 DIAGNOSIS — J323 Chronic sphenoidal sinusitis: Secondary | ICD-10-CM | POA: Diagnosis not present

## 2021-10-07 DIAGNOSIS — Z87891 Personal history of nicotine dependence: Secondary | ICD-10-CM | POA: Insufficient documentation

## 2021-10-07 DIAGNOSIS — W19XXXA Unspecified fall, initial encounter: Secondary | ICD-10-CM | POA: Diagnosis not present

## 2021-10-07 DIAGNOSIS — T07XXXA Unspecified multiple injuries, initial encounter: Secondary | ICD-10-CM | POA: Diagnosis not present

## 2021-10-07 DIAGNOSIS — R1011 Right upper quadrant pain: Secondary | ICD-10-CM | POA: Diagnosis not present

## 2021-10-07 DIAGNOSIS — S61224A Laceration with foreign body of right ring finger without damage to nail, initial encounter: Secondary | ICD-10-CM | POA: Diagnosis not present

## 2021-10-07 DIAGNOSIS — S6991XA Unspecified injury of right wrist, hand and finger(s), initial encounter: Secondary | ICD-10-CM | POA: Diagnosis present

## 2021-10-07 DIAGNOSIS — R0902 Hypoxemia: Secondary | ICD-10-CM | POA: Diagnosis not present

## 2021-10-07 DIAGNOSIS — S5012XA Contusion of left forearm, initial encounter: Secondary | ICD-10-CM | POA: Insufficient documentation

## 2021-10-07 DIAGNOSIS — Z79899 Other long term (current) drug therapy: Secondary | ICD-10-CM | POA: Diagnosis not present

## 2021-10-07 DIAGNOSIS — M7989 Other specified soft tissue disorders: Secondary | ICD-10-CM | POA: Diagnosis not present

## 2021-10-07 DIAGNOSIS — F10929 Alcohol use, unspecified with intoxication, unspecified: Secondary | ICD-10-CM

## 2021-10-07 DIAGNOSIS — W19XXXS Unspecified fall, sequela: Secondary | ICD-10-CM

## 2021-10-07 DIAGNOSIS — S61212A Laceration without foreign body of right middle finger without damage to nail, initial encounter: Secondary | ICD-10-CM | POA: Diagnosis not present

## 2021-10-07 LAB — COMPREHENSIVE METABOLIC PANEL
ALT: 65 U/L — ABNORMAL HIGH (ref 0–44)
AST: 161 U/L — ABNORMAL HIGH (ref 15–41)
Albumin: 4 g/dL (ref 3.5–5.0)
Alkaline Phosphatase: 107 U/L (ref 38–126)
Anion gap: 8 (ref 5–15)
BUN: 13 mg/dL (ref 6–20)
CO2: 24 mmol/L (ref 22–32)
Calcium: 8.4 mg/dL — ABNORMAL LOW (ref 8.9–10.3)
Chloride: 105 mmol/L (ref 98–111)
Creatinine, Ser: 0.58 mg/dL (ref 0.44–1.00)
GFR, Estimated: >60 mL/min (ref >60)
Glucose, Bld: 134 mg/dL — ABNORMAL HIGH (ref 70–99)
Potassium: 4 mmol/L (ref 3.5–5.1)
Sodium: 137 mmol/L (ref 135–145)
Total Bilirubin: 1.1 mg/dL (ref 0.3–1.2)
Total Protein: 6.9 g/dL (ref 6.5–8.1)

## 2021-10-07 LAB — CBC WITH DIFFERENTIAL/PLATELET
Abs Immature Granulocytes: 0.01 10*3/uL (ref 0.00–0.07)
Basophils Absolute: 0 10*3/uL (ref 0.0–0.1)
Basophils Relative: 0 %
Eosinophils Absolute: 0.1 10*3/uL (ref 0.0–0.5)
Eosinophils Relative: 1 %
HCT: 42.9 % (ref 36.0–46.0)
Hemoglobin: 15.3 g/dL — ABNORMAL HIGH (ref 12.0–15.0)
Immature Granulocytes: 0 %
Lymphocytes Relative: 40 %
Lymphs Abs: 2.5 10*3/uL (ref 0.7–4.0)
MCH: 34.7 pg — ABNORMAL HIGH (ref 26.0–34.0)
MCHC: 35.7 g/dL (ref 30.0–36.0)
MCV: 97.3 fL (ref 80.0–100.0)
Monocytes Absolute: 0.4 10*3/uL (ref 0.1–1.0)
Monocytes Relative: 6 %
Neutro Abs: 3.2 10*3/uL (ref 1.7–7.7)
Neutrophils Relative %: 53 %
Platelets: 130 10*3/uL — ABNORMAL LOW (ref 150–400)
RBC: 4.41 MIL/uL (ref 3.87–5.11)
RDW: 14 % (ref 11.5–15.5)
WBC: 6.2 10*3/uL (ref 4.0–10.5)
nRBC: 0 % (ref 0.0–0.2)

## 2021-10-07 LAB — RAPID URINE DRUG SCREEN, HOSP PERFORMED
Amphetamines: NOT DETECTED
Barbiturates: NOT DETECTED
Benzodiazepines: POSITIVE — AB
Cocaine: NOT DETECTED
Opiates: POSITIVE — AB
Tetrahydrocannabinol: NOT DETECTED

## 2021-10-07 LAB — LIPASE, BLOOD: Lipase: 23 U/L (ref 11–51)

## 2021-10-07 LAB — ETHANOL: Alcohol, Ethyl (B): 262 mg/dL — ABNORMAL HIGH (ref <10)

## 2021-10-07 MED ORDER — LIDOCAINE HCL (PF) 1 % IJ SOLN
30.0000 mL | Freq: Once | INTRAMUSCULAR | Status: AC
  Administered 2021-10-07: 30 mL
  Filled 2021-10-07: qty 30

## 2021-10-07 MED ORDER — TETANUS-DIPHTH-ACELL PERTUSSIS 5-2.5-18.5 LF-MCG/0.5 IM SUSY: 0.5000 mL | PREFILLED_SYRINGE | Freq: Once | INTRAMUSCULAR | Status: DC

## 2021-10-07 NOTE — Discharge Instructions (Addendum)
You were seen in the emergency department today for alcohol intoxication and falls.  While you were here we obtained lab work which was normal other than your alcohol level.  Additionally we obtained an image of your head and your arm to make sure that there was no acute fractures dislocations or bleeds.  All of this was normal.  While you are here I also sutured your hand back up.  You will need to return to your primary care physician, emergency department or urgent care in 10 days to have your sutures removed.  In the meantime please use gentle soap and water over the area.  Do not scrub the sutures.  And keep the area clean.  I have given you resources for [facility] recovery for your alcohol use.  Please contact them in the morning to set up an evaluation.

## 2021-10-07 NOTE — ED Provider Notes (Signed)
  Face-to-face evaluation   History: She presents for evaluation of suspected alcohol intoxication with falling.  She is with a friend who helps give history.  She is unable to specify any significant injuries.  On level 5 caveat-altered mental status.  Physical exam: Alert, angry, redirectable.  No respiratory distress.  Moderate dysarthria.  Moving all extremities equally.  Medical screening examination/treatment/procedure(s) were conducted as a shared visit with non-physician practitioner(s) and myself.  I personally evaluated the patient during the encounter    Daleen Bo, MD 10/07/21 [phone]

## 2021-10-07 NOTE — ED Notes (Signed)
PA-C notified pt's tetanus is UTD, received within the last three years per the pt. Holding tetanus at this time. PA-C verified and aware.

## 2021-10-07 NOTE — ED Triage Notes (Signed)
Pt presents via EMS from home with c/o alcohol intoxication. Pt has been falling multiple times at home because of the alcohol intoxication. Pt has cuts on her fingers on her right hand, swelling and bruising on her left and right elbow, and bruising to the right shin. Pt is obviously intoxicated per EMS.

## 2021-10-07 NOTE — ED Provider Notes (Signed)
[facility]PT Provider Note   CSN: [id number] Arrival date & time: 10/07/21  1038     History Chief Complaint  Patient presents with   Alcohol Intoxication    [name] is a 60 y.o. female.  With past medical history of alcohol dependence, PTSD, GERD, hypertension who presents emergency department for acute alcohol intoxication and frequent falls.  She presents with significant other at bedside who states that she has had multiple falls since Saturday.  Most have been unwitnessed falls.  States that she fell down the stairs yesterday and then again in the bathroom today, cutting her hand on unknown object.  States that she has significant alcohol use but cannot quantify.  The patient appears acutely intoxicated.  The patient states that she has pain to her left arm and abdominal pain.  States that the pain is primarily in the upper quadrants and epigastric region and radiates to her back.  She states that this pain has been going on for about 5 months.  Endorses nausea without vomiting.  Denies diarrhea or fevers.  She endorses alcohol use but does not quantify.  She denies illicit drug use.  Denies chest pain or shortness of breath.   Alcohol Intoxication Associated symptoms include abdominal pain. Pertinent negatives include no chest pain and no shortness of breath.      Past Medical History:  Diagnosis Date   Alcohol dependence ([place])    recent IVC at [facility] 10/ 2015 by son--  11-07-2014 pt states no alcohol since 09-11-2014 (approx)   Anxiety    Frequency of urination    GERD (gastroesophageal reflux disease)    Headache(784.0)    Hypertension    no meds per pt,  states bp been ok    Major depression    PTSD (post-traumatic stress disorder)    Renal calculus, right    Right ureteral stone    Urgency of urination     Patient Active Problem List   Diagnosis Date Noted   Post traumatic stress disorder (PTSD) 09/12/2014   Family history  of alcoholism in father 09/12/2014   Alcohol dependence with uncomplicated withdrawal ([name]) 09/11/2014   Dysmenorrhea 03/19/2014   GERD 11/03/2008   ANXIETY 12/20/2007   MIGRAINE HEADACHE 12/20/2007   HYPERTENSION 12/20/2007   SLEEP DISORDER 12/20/2007   HEADACHE 12/20/2007    Past Surgical History:  Procedure Laterality Date   BILATERAL SALPINGECTOMY Bilateral 03/19/2014   Procedure: BILATERAL SALPINGECTOMY;  Surgeon: Lovenia Kim, MD;  Location: [facility];  Service: Gynecology;  Laterality: Bilateral;   CESAREAN SECTION  1991   HOLMIUM LASER APPLICATION Right 27/0/3500   Procedure: HOLMIUM LASER APPLICATION;  Surgeon: Alexis Frock, MD;  Location: [facility];  Service: Urology;  Laterality: Right;   ROBOTIC ASSISTED TOTAL HYSTERECTOMY N/A 03/19/2014   Procedure: ROBOTIC ASSISTED TOTAL HYSTERECTOMY;  Surgeon: Lovenia Kim, MD;  Location: [facility];  Service: Gynecology;  Laterality: N/A;   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     Family History  Problem Relation Age of Onset   Alcohol abuse Father     Social History   Tobacco Use   Smoking status: Former    Types: Cigarettes    Quit date: 09/11/2014    Years since quitting: 7.0   Smokeless tobacco: Never  Substance Use Topics   Alcohol use: Yes  Alcohol/week: 20.0 standard drinks    Types: 10 Glasses of wine, 10 Shots of liquor per week    Comment: last alcohol 55 days ago (approx. 09-11-2014)   Drug use: Yes    Types: Marijuana, Benzodiazepines    Comment: last marjuana 2013/  ambien dependence    Home Medications Prior to Admission medications   Medication Sig Start Date End Date Taking? Authorizing Provider  cephALEXin (KEFLEX) 500 MG capsule Take 1 capsule (500 mg total) by mouth 2 (two) times daily. X 5 days to prevent infection 11/12/14   Alexis Frock, MD  clonazePAM  (KLONOPIN) 0.5 MG tablet Take 0.5 mg by mouth 2 (two) times daily as needed for anxiety.    [provider]  eletriptan (RELPAX) 40 MG tablet Take 20 mg by mouth 2 (two) times daily as needed for migraine or headache. One tablet by mouth at onset of headache. May repeat in 2 hours if headache persists or recurs.    [provider]  escitalopram (LEXAPRO) 20 MG tablet Take 20 mg by mouth every morning.     [provider]  esomeprazole (NEXIUM) 40 MG capsule Take 40 mg by mouth as needed.    [provider]  meloxicam (MOBIC) 15 MG tablet Take 1 tablet (15 mg total) by mouth daily. 06/22/20   Mesner, Corene Cornea, MD  MINIVELLE 0.05 MG/24HR patch Place 1 patch onto the skin 2 (two) times a week. Change on Thursdays and Sundays 04/24/14   [provider]  oxyCODONE-acetaminophen (ROXICET) 5-325 MG per tablet Take 1-2 tablets by mouth every 6 (six) hours as needed for moderate pain or severe pain. Post-operatively 11/12/14   Alexis Frock, MD  senna-docusate (SENOKOT-S) 8.6-50 MG per tablet Take 1 tablet by mouth 2 (two) times daily. While taking pain meds to prevent constipation 11/12/14   Alexis Frock, MD  zolpidem (AMBIEN CR) 12.5 MG CR tablet Take 12.5 mg by mouth at bedtime.  08/11/14   [provider]    Allergies    Patient has no known allergies.  Review of Systems   Review of Systems  Constitutional:  Negative for fever.  Respiratory:  Negative for shortness of breath.   Cardiovascular:  Negative for chest pain.  Gastrointestinal:  Positive for abdominal pain and nausea. Negative for blood in stool, diarrhea and vomiting.  Musculoskeletal:  Positive for gait problem.  All other systems reviewed and are negative.  Physical Exam Updated Vital Signs BP (!) 116/93 (BP Location: Left Arm)   Pulse 78   Temp 97.8 F (36.6 C) (Oral)   Resp 16   LMP 02/09/2014 Comment: AUB  SpO2 94%   Physical Exam Vitals and nursing note reviewed.   Constitutional:      General: She is not in acute distress.    Appearance: Normal appearance.     Comments: Intoxicated   HENT:     Head: Normocephalic and atraumatic.     Nose: Nose normal.     Mouth/Throat:     Mouth: Mucous membranes are moist.     Pharynx: Oropharynx is clear.  Eyes:     General: No scleral icterus.    Extraocular Movements: Extraocular movements intact.     Pupils: Pupils are equal, round, and reactive to light.  Cardiovascular:     Rate and Rhythm: Normal rate and regular rhythm.     Pulses: Normal pulses.     Heart sounds: No murmur heard. Pulmonary:     Effort: Pulmonary effort is normal.  No respiratory distress.     Breath sounds: Normal breath sounds.  Abdominal:     General: Bowel sounds are normal. There is no distension.     Palpations: Abdomen is soft.     Tenderness: There is abdominal tenderness. There is no guarding.  Musculoskeletal:        General: Swelling and signs of injury present. Normal range of motion.     Cervical back: Normal range of motion and neck supple. No tenderness.  Skin:    General: Skin is warm and dry.     Capillary Refill: Capillary refill takes less than 2 seconds.     Findings: Bruising present.          Comments: Multiple large bruises on her extremities.  She has notably a area of ecchymoses with underlying swelling of the left forearm. There are 2 1 to 2 cm lacerations to her right second and third digits Large ecchymosis to the right lower extremity without swelling or deformity  Neurological:     General: No focal deficit present.     Mental Status: She is alert and oriented to person, place, and time.  Psychiatric:        Attention and Perception: She does not perceive auditory or visual hallucinations.        Mood and Affect: Affect is labile.        Speech: Speech normal.        Behavior: Behavior is cooperative.        Thought Content: Thought content is not paranoid or delusional. Thought content does  not include homicidal or suicidal ideation.        Cognition and Memory: Cognition is impaired. Memory is impaired.        Judgment: Judgment is impulsive. Judgment is not inappropriate.    ED Results / Procedures / Treatments   Labs (all labs ordered are listed, but only abnormal results are displayed) Labs Reviewed  RAPID URINE DRUG SCREEN, HOSP PERFORMED - Abnormal; Notable for the following components:      Result Value   Opiates POSITIVE (*)    Benzodiazepines POSITIVE (*)    All other components within normal limits  COMPREHENSIVE METABOLIC PANEL - Abnormal; Notable for the following components:   Glucose, Bld 134 (*)    Calcium 8.4 (*)    AST 161 (*)    ALT 65 (*)    All other components within normal limits  ETHANOL - Abnormal; Notable for the following components:   Alcohol, Ethyl (B) 262 (*)    All other components within normal limits  CBC WITH DIFFERENTIAL/PLATELET - Abnormal; Notable for the following components:   Hemoglobin 15.3 (*)    MCH 34.7 (*)    Platelets 130 (*)    All other components within normal limits  LIPASE, BLOOD    EKG None  Radiology DG Forearm Left  Result Date: 10/07/2021 CLINICAL DATA:  Fall. EXAM: LEFT FOREARM - 2 VIEW COMPARISON:  Left wrist x-rays dated August 06, 2006. FINDINGS: There is no evidence of fracture or other focal bone lesions. Focal asymmetric soft tissue swelling along the radial aspect of the proximal forearm. IMPRESSION: 1. Proximal forearm soft tissue swelling. No acute osseous abnormality. Electronically Signed   By: Titus Dubin M.D.   On: 10/07/2021 11:58   CT Head Wo Contrast  Result Date: 10/07/2021 CLINICAL DATA:  Head trauma, mod-severe EXAM: CT HEAD WITHOUT CONTRAST TECHNIQUE: Contiguous axial images were obtained from the base of the skull  through the vertex without intravenous contrast. COMPARISON:  2017 FINDINGS: Brain: There is no acute intracranial hemorrhage, mass effect, or edema. Gray-white  differentiation is preserved. There is no extra-axial fluid collection. Ventricles and sulci are within normal limits in size and configuration. Vascular: No hyperdense vessel or unexpected calcification. Skull: Calvarium is unremarkable. Sinuses/Orbits: Chronic sphenoid sinusitis. Other: None. IMPRESSION: No evidence of acute intracranial injury. Electronically Signed   By: Macy Mis M.D.   On: 10/07/2021 12:05    Procedures .Marland KitchenLaceration Repair  Date/Time: 10/07/2021 3:07 PM Performed by: Mickie Hillier, PA-C Authorized by: Mickie Hillier, PA-C   Consent:    Consent obtained:  Verbal   Consent given by:  Patient   Risks, benefits, and alternatives were discussed: yes     Risks discussed:  Infection, need for additional repair, poor cosmetic result, poor wound healing and pain   Alternatives discussed:  No treatment and delayed treatment Universal protocol:    Procedure explained and questions answered to patient or proxy's satisfaction: yes     Relevant documents present and verified: yes     Test results available: yes     Imaging studies available: yes     Required blood products, implants, devices, and special equipment available: yes     Site/side marked: yes     Immediately prior to procedure, a time out was called: yes     Patient identity confirmed:  Verbally with patient Anesthesia:    Anesthesia method:  Local infiltration   Local anesthetic:  Lidocaine 1% w/o epi Laceration details:    Location:  Finger   Finger location:  R long finger   Length (cm):  2   Depth (mm):  3 Pre-procedure details:    Preparation:  Patient was prepped and draped in usual sterile fashion Exploration:    Limited defect created (wound extended): no     Hemostasis achieved with:  Tied off vessels and direct pressure   Wound exploration: wound explored through full range of motion and entire depth of wound visualized     Wound extent: areolar tissue violated     Contaminated: yes    Treatment:    Area cleansed with:  Povidone-iodine and saline (povidone-iodine/saline submersion >15 minutes)   Amount of cleaning:  Extensive   Irrigation solution:  Sterile saline   Irrigation volume:  200   Debridement:  Minimal   Undermining:  None Skin repair:    Repair method:  Sutures   Suture size:  5-0   Suture material:  Prolene   Suture technique:  Simple interrupted   Number of sutures:  3 Approximation:    Approximation:  Close Repair type:    Repair type:  Simple Post-procedure details:    Dressing:  Open (no dressing)   Procedure completion:  Tolerated well, no immediate complications .Marland KitchenLaceration Repair  Date/Time: 10/07/2021 3:08 PM Performed by: Mickie Hillier, PA-C Authorized by: Mickie Hillier, PA-C   Consent:    Consent obtained:  Verbal   Consent given by:  Patient   Risks, benefits, and alternatives were discussed: yes     Risks discussed:  Infection, need for additional repair, pain, poor cosmetic result and poor wound healing   Alternatives discussed:  No treatment Universal protocol:    Procedure explained and questions answered to patient or proxy's satisfaction: yes     Relevant documents present and verified: yes     Test results available: yes     Imaging studies available: yes  Required blood products, implants, devices, and special equipment available: yes     Site/side marked: yes     Immediately prior to procedure, a time out was called: yes     Patient identity confirmed:  Verbally with patient Anesthesia:    Anesthesia method:  Local infiltration   Local anesthetic:  Lidocaine 1% w/o epi Laceration details:    Location:  Finger   Finger location:  R ring finger   Length (cm):  2   Depth (mm):  2 Pre-procedure details:    Preparation:  Patient was prepped and draped in usual sterile fashion Exploration:    Hemostasis achieved with:  Direct pressure and tied off vessels   Wound exploration: wound explored through full range  of motion and entire depth of wound visualized     Wound extent: areolar tissue violated     Contaminated: no   Treatment:    Area cleansed with:  Povidone-iodine and saline (povidone-iodine/saline bath)   Amount of cleaning:  Extensive   Irrigation solution:  Sterile saline   Visualized foreign bodies/material removed: yes     Debridement:  Minimal   Undermining:  None Skin repair:    Repair method:  Sutures   Suture size:  5-0   Suture material:  Prolene   Suture technique:  Simple interrupted   Number of sutures:  2 Approximation:    Approximation:  Close Repair type:    Repair type:  Simple Post-procedure details:    Dressing:  Open (no dressing)   Procedure completion:  Tolerated well, no immediate complications   Medications Ordered in ED Medications  Tdap (BOOSTRIX) injection 0.5 mL (0 mLs Intramuscular Hold 10/07/21 1234)  lidocaine (PF) (XYLOCAINE) 1 % injection 30 mL (30 mLs Infiltration Given 10/07/21 1325)    ED Course  I have reviewed the triage vital signs and the nursing notes.  Pertinent labs & imaging results that were available during my care of the patient were reviewed by me and considered in my medical decision making (see chart for details).    MDM Rules/Calculators/A&P 60 year old female who is presented to the emergency department with acute alcohol intoxication and frequent falls.  She does have intermittent confusion as to where she is.  However EtOH 262. CT head negative. She does have bruising all over which is consistent with significant other describing frequent falls at home.  She has a specific source of bruising and swelling to the left forearm.  Plain films are negative for acute fracture. She has 2 lacerations to the right middle and ring finger which have been sutured.  I have given her instructions for care and to return in 10 days to primary care, urgent care or emergency department to have them removed. AST and ALT are elevated with AST  2 times ALT which is consistent with alcohol use. Otherwise labs are unremarkable.  I have no concern about acute liver failure, pancreatitis or other acute or emergent needs here in the emergency department. She is able to walk without assistance, tolerate p.o. She has a caretaker, her significant other, who is able to take her home at this time.  I have given her resources for [facility] recovery.  I have instructed the significant other to call them tomorrow to set up an appointment for evaluation. Her vitals are stable and she is safe for discharge. Final Clinical Impression(s) / ED Diagnoses Final diagnoses:  Acute alcoholic intoxication with complication ([place])  Fall, sequela    Rx / DC Orders  ED Discharge Orders     None        Mickie Hillier, PA-C 10/07/21 1547    Daleen Bo, MD 10/07/21 [phone]

## 2021-10-07 NOTE — ED Notes (Signed)
PA-C at the bedside to evaluate.  

## 2021-10-18 DIAGNOSIS — Z5181 Encounter for therapeutic drug level monitoring: Secondary | ICD-10-CM | POA: Diagnosis not present

## 2021-10-18 DIAGNOSIS — G894 Chronic pain syndrome: Secondary | ICD-10-CM | POA: Diagnosis not present

## 2021-10-18 DIAGNOSIS — M5136 Other intervertebral disc degeneration, lumbar region: Secondary | ICD-10-CM | POA: Diagnosis not present

## 2021-10-18 DIAGNOSIS — Z4802 Encounter for removal of sutures: Secondary | ICD-10-CM | POA: Diagnosis not present

## 2021-10-18 DIAGNOSIS — S61212D Laceration without foreign body of right middle finger without damage to nail, subsequent encounter: Secondary | ICD-10-CM | POA: Diagnosis not present

## 2021-10-18 DIAGNOSIS — S61214D Laceration without foreign body of right ring finger without damage to nail, subsequent encounter: Secondary | ICD-10-CM | POA: Diagnosis not present

## 2021-10-18 DIAGNOSIS — Z79891 Long term (current) use of opiate analgesic: Secondary | ICD-10-CM | POA: Diagnosis not present

## 2021-10-21 DIAGNOSIS — R7303 Prediabetes: Secondary | ICD-10-CM | POA: Diagnosis not present

## 2021-10-21 DIAGNOSIS — I10 Essential (primary) hypertension: Secondary | ICD-10-CM | POA: Diagnosis not present

## 2021-12-14 ENCOUNTER — Ambulatory Visit (INDEPENDENT_AMBULATORY_CARE_PROVIDER_SITE_OTHER): Payer: Medicare HMO | Admitting: Podiatry

## 2021-12-14 ENCOUNTER — Other Ambulatory Visit: Payer: Self-pay

## 2021-12-14 ENCOUNTER — Encounter: Payer: Self-pay | Admitting: Podiatry

## 2021-12-14 ENCOUNTER — Ambulatory Visit (INDEPENDENT_AMBULATORY_CARE_PROVIDER_SITE_OTHER): Payer: Medicare HMO

## 2021-12-14 DIAGNOSIS — M898X7 Other specified disorders of bone, ankle and foot: Secondary | ICD-10-CM | POA: Diagnosis not present

## 2021-12-14 DIAGNOSIS — D2371 Other benign neoplasm of skin of right lower limb, including hip: Secondary | ICD-10-CM

## 2021-12-14 NOTE — Progress Notes (Signed)
Subjective:  Patient ID: [name], female    DOB: 1961-03-25,  MRN: [id number] HPI Chief Complaint  Patient presents with   Toe Pain    5th toe right - callused area x 10 years, only sore in winter months when wearing enclosed shoes, rubs 4th toe, uses medicated pads and soaks   New Patient (Initial Visit)    61 y.o. female presents with the above complaint.   ROS: Denies fever chills nausea vomiting muscle aches pains calf pain back pain chest pain shortness of breath.  Past Medical History:  Diagnosis Date   Alcohol dependence ([place])    recent IVC at [facility] 10/ 2015 by son--  11-07-2014 pt states no alcohol since 09-11-2014 (approx)   Anxiety    Frequency of urination    GERD (gastroesophageal reflux disease)    Headache(784.0)    Hypertension    no meds per pt,  states bp been ok    Major depression    PTSD (post-traumatic stress disorder)    Renal calculus, right    Right ureteral stone    Urgency of urination    Past Surgical History:  Procedure Laterality Date   BILATERAL SALPINGECTOMY Bilateral 03/19/2014   Procedure: BILATERAL SALPINGECTOMY;  Surgeon: Lovenia Kim, MD;  Location: [facility];  Service: Gynecology;  Laterality: Bilateral;   CESAREAN SECTION  1991   HOLMIUM LASER APPLICATION Right 80/08/9832   Procedure: HOLMIUM LASER APPLICATION;  Surgeon: Alexis Frock, MD;  [facility];  Service: Urology;  Laterality: Right;   ROBOTIC ASSISTED TOTAL HYSTERECTOMY N/A 03/19/2014   Procedure: ROBOTIC ASSISTED TOTAL HYSTERECTOMY;  Surgeon: Lovenia Kim, MD;  Location: [facility];  Service: Gynecology;  Laterality: N/A;   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 One tablet by mouth at onset of headache. May repeat in 2 hours if headache persists or recurs., Disp: , Rfl:    escitalopram (LEXAPRO) 20 MG tablet, Take 20 mg by mouth every morning. , Disp: , Rfl:    esomeprazole (NEXIUM) 40 MG capsule, Take 40 mg by mouth as needed., Disp: , Rfl:    estradiol (VIVELLE-DOT) 0.0375 MG/24HR, 1 patch 2 (two) times a week., Disp: , Rfl:    FLUZONE QUADRIVALENT 0.5 ML injection, , Disp: , Rfl:    HYDROcodone-acetaminophen (NORCO/VICODIN) 5-325 MG tablet, Take 1 tablet by mouth 3 (three) times daily as needed., Disp: , Rfl:    meloxicam (MOBIC) 15 MG tablet, Take 1 tablet (15 mg total) by mouth daily. (Patient not taking: No sig reported), Disp: 14 tablet, Rfl: 0   metoprolol succinate (TOPROL-XL) 50 MG 24 hr tablet, Take 50 mg by mouth daily., Disp: , Rfl:    oxyCODONE-acetaminophen (ROXICET) 5-325 MG per tablet, Take 1-2 tablets by mouth every 6 (six) hours as needed for moderate pain or severe pain. Post-operatively (Patient not taking: No sig reported), Disp: 30 tablet, Rfl: 0   rizatriptan (MAXALT) 5  MG tablet, SMARTSIG:1-2 Tablet(s) By Mouth 1-2 Times Daily PRN, Disp: , Rfl:    senna-docusate (SENOKOT-S) 8.6-50 MG per tablet, Take 1 tablet by mouth 2 (two) times daily. While taking pain meds to prevent constipation (Patient not taking: No sig reported), Disp: 30 tablet, Rfl: 0   zolpidem (AMBIEN) 10 MG tablet, Take 10 mg by mouth at bedtime., Disp: , Rfl:   No Known Allergies Review of Systems Objective:  There were no vitals filed for this visit.  General: Well developed, nourished, in no acute distress, alert and oriented x3   Dermatological: Skin is warm, dry and supple bilateral. Nails x 10 are well maintained; remaining integument appears  unremarkable at this time. There are no open sores, no preulcerative lesions, no rash or signs of infection present.  Hyperkeratotic lesion medial aspect fifth digit right foot with a central core.  Vascular: Dorsalis Pedis artery and Posterior Tibial artery pedal pulses are 2/4 bilateral with immedate capillary fill time. Pedal hair growth present. No varicosities and no lower extremity edema present bilateral.   Neruologic: Grossly intact via light touch bilateral. Vibratory intact via tuning fork bilateral. Protective threshold with Semmes Wienstein monofilament intact to all pedal sites bilateral. Patellar and Achilles deep tendon reflexes 2+ bilateral. No Babinski or clonus noted bilateral.   Musculoskeletal: No gross boney pedal deformities bilateral. No pain, crepitus, or limitation noted with foot and ankle range of motion bilateral. Muscular strength 5/5 in all groups tested bilateral.  Adductovarus rotated hammertoe deformity fifth right  Gait: Unassisted, Nonantalgic.    Radiographs:  Radiographs taken today demonstrate adductovarus rotated hammertoe deformity hypertrophic medial condyle to the distal phalanx fifth digit right foot.  Fourth toe overlaps the fifth.  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  She has a distal medial hyperkeratotic lesion measuring only about 2 mm in diameter with a central core which was sharply debrided today.  Plan: Debrided reactive hyperkeratotic lesion today.  There was some bleeding placed antibiotic ointment and dry sterile compressive dressing dispensed silicone padding instructed her to watch the toe and apply Neosporin on a daily basis until this is resolved did discuss the possible need for surgery.       [name] T. [place], [place]

## 2022-01-04 DIAGNOSIS — Z79899 Other long term (current) drug therapy: Secondary | ICD-10-CM | POA: Diagnosis not present

## 2022-01-04 DIAGNOSIS — Z130 Encounter for screening for diseases of the blood and blood-forming organs and certain disorders involving the immune mechanism: Secondary | ICD-10-CM | POA: Diagnosis not present

## 2022-02-15 DIAGNOSIS — Z79891 Long term (current) use of opiate analgesic: Secondary | ICD-10-CM | POA: Diagnosis not present

## 2022-02-15 DIAGNOSIS — M5136 Other intervertebral disc degeneration, lumbar region: Secondary | ICD-10-CM | POA: Diagnosis not present

## 2022-02-15 DIAGNOSIS — G894 Chronic pain syndrome: Secondary | ICD-10-CM | POA: Diagnosis not present

## 2022-02-15 DIAGNOSIS — M5459 Other low back pain: Secondary | ICD-10-CM | POA: Diagnosis not present

## 2022-03-14 DIAGNOSIS — E7800 Pure hypercholesterolemia, unspecified: Secondary | ICD-10-CM | POA: Diagnosis not present

## 2022-03-14 DIAGNOSIS — M5090 Cervical disc disorder, unspecified, unspecified cervical region: Secondary | ICD-10-CM | POA: Diagnosis not present

## 2022-03-14 DIAGNOSIS — R739 Hyperglycemia, unspecified: Secondary | ICD-10-CM | POA: Diagnosis not present

## 2022-03-14 DIAGNOSIS — Z0000 Encounter for general adult medical examination without abnormal findings: Secondary | ICD-10-CM | POA: Diagnosis not present

## 2022-03-14 DIAGNOSIS — F1010 Alcohol abuse, uncomplicated: Secondary | ICD-10-CM | POA: Diagnosis not present

## 2022-03-14 DIAGNOSIS — G43909 Migraine, unspecified, not intractable, without status migrainosus: Secondary | ICD-10-CM | POA: Diagnosis not present

## 2022-03-14 DIAGNOSIS — E663 Overweight: Secondary | ICD-10-CM | POA: Diagnosis not present

## 2022-04-07 ENCOUNTER — Other Ambulatory Visit: Payer: Self-pay

## 2022-04-07 ENCOUNTER — Emergency Department (HOSPITAL_COMMUNITY)
Admission: EM | Admit: 2022-04-07 | Discharge: 2022-04-09 | Disposition: A | Discharge status: Home / Self Care | Payer: Medicare HMO | Attending: Emergency Medicine | Admitting: Emergency Medicine

## 2022-04-07 DIAGNOSIS — Z20822 Contact with and (suspected) exposure to covid-19: Secondary | ICD-10-CM | POA: Insufficient documentation

## 2022-04-07 DIAGNOSIS — F10920 Alcohol use, unspecified with intoxication, uncomplicated: Secondary | ICD-10-CM

## 2022-04-07 DIAGNOSIS — R799 Abnormal finding of blood chemistry, unspecified: Secondary | ICD-10-CM | POA: Diagnosis not present

## 2022-04-07 DIAGNOSIS — F1924 Other psychoactive substance dependence with psychoactive substance-induced mood disorder: Secondary | ICD-10-CM | POA: Diagnosis not present

## 2022-04-07 DIAGNOSIS — F332 Major depressive disorder, recurrent severe without psychotic features: Secondary | ICD-10-CM | POA: Diagnosis not present

## 2022-04-07 DIAGNOSIS — R064 Hyperventilation: Secondary | ICD-10-CM | POA: Diagnosis not present

## 2022-04-07 DIAGNOSIS — R000 Tachycardia, unspecified: Secondary | ICD-10-CM | POA: Diagnosis not present

## 2022-04-07 DIAGNOSIS — R457 State of emotional shock and stress, unspecified: Secondary | ICD-10-CM | POA: Diagnosis not present

## 2022-04-07 DIAGNOSIS — F1020 Alcohol dependence, uncomplicated: Secondary | ICD-10-CM | POA: Diagnosis not present

## 2022-04-07 DIAGNOSIS — R45851 Suicidal ideations: Secondary | ICD-10-CM | POA: Insufficient documentation

## 2022-04-07 DIAGNOSIS — F1010 Alcohol abuse, uncomplicated: Secondary | ICD-10-CM | POA: Diagnosis not present

## 2022-04-07 DIAGNOSIS — Y908 Blood alcohol level of 240 mg/100 ml or more: Secondary | ICD-10-CM | POA: Insufficient documentation

## 2022-04-07 DIAGNOSIS — I10 Essential (primary) hypertension: Secondary | ICD-10-CM | POA: Diagnosis not present

## 2022-04-07 DIAGNOSIS — F1999 Other psychoactive substance use, unspecified with unspecified psychoactive substance-induced disorder: Secondary | ICD-10-CM | POA: Diagnosis present

## 2022-04-07 DIAGNOSIS — F32A Depression, unspecified: Secondary | ICD-10-CM | POA: Diagnosis present

## 2022-04-07 LAB — CBC WITH DIFFERENTIAL/PLATELET
Abs Immature Granulocytes: 0.02 10*3/uL (ref 0.00–0.07)
Basophils Absolute: 0 10*3/uL (ref 0.0–0.1)
Basophils Relative: 0 %
Eosinophils Absolute: 0 10*3/uL (ref 0.0–0.5)
Eosinophils Relative: 0 %
HCT: 47.6 % — ABNORMAL HIGH (ref 36.0–46.0)
Hemoglobin: 16.7 g/dL — ABNORMAL HIGH (ref 12.0–15.0)
Immature Granulocytes: 0 %
Lymphocytes Relative: 38 %
Lymphs Abs: 2.5 10*3/uL (ref 0.7–4.0)
MCH: 32.4 pg (ref 26.0–34.0)
MCHC: 35.1 g/dL (ref 30.0–36.0)
MCV: 92.2 fL (ref 80.0–100.0)
Monocytes Absolute: 0.4 10*3/uL (ref 0.1–1.0)
Monocytes Relative: 6 %
Neutro Abs: 3.7 10*3/uL (ref 1.7–7.7)
Neutrophils Relative %: 56 %
Platelets: 121 10*3/uL — ABNORMAL LOW (ref 150–400)
RBC: 5.16 MIL/uL — ABNORMAL HIGH (ref 3.87–5.11)
RDW: 14.6 % (ref 11.5–15.5)
WBC: 6.7 10*3/uL (ref 4.0–10.5)
nRBC: 0 % (ref 0.0–0.2)

## 2022-04-07 LAB — COMPREHENSIVE METABOLIC PANEL
ALT: 176 U/L — ABNORMAL HIGH (ref 0–44)
AST: 375 U/L — ABNORMAL HIGH (ref 15–41)
Albumin: 4.3 g/dL (ref 3.5–5.0)
Alkaline Phosphatase: 132 U/L — ABNORMAL HIGH (ref 38–126)
Anion gap: 15 (ref 5–15)
BUN: 19 mg/dL (ref 6–20)
CO2: 20 mmol/L — ABNORMAL LOW (ref 22–32)
Calcium: 9.5 mg/dL (ref 8.9–10.3)
Chloride: 102 mmol/L (ref 98–111)
Creatinine, Ser: 0.84 mg/dL (ref 0.44–1.00)
GFR, Estimated: >60 mL/min (ref >60)
Glucose, Bld: 139 mg/dL — ABNORMAL HIGH (ref 70–99)
Potassium: 4.2 mmol/L (ref 3.5–5.1)
Sodium: 137 mmol/L (ref 135–145)
Total Bilirubin: 1.8 mg/dL — ABNORMAL HIGH (ref 0.3–1.2)
Total Protein: 7.6 g/dL (ref 6.5–8.1)

## 2022-04-07 LAB — RESP PANEL BY RT-PCR (FLU A&B, COVID) ARPGX2
Influenza A by PCR: NEGATIVE
Influenza B by PCR: NEGATIVE
SARS Coronavirus 2 by RT PCR: NEGATIVE

## 2022-04-07 LAB — ACETAMINOPHEN LEVEL: Acetaminophen (Tylenol), Serum: <10 ug/mL — ABNORMAL LOW (ref 10–30)

## 2022-04-07 LAB — ETHANOL: Alcohol, Ethyl (B): 309 mg/dL (ref <10)

## 2022-04-07 LAB — SALICYLATE LEVEL: Salicylate Lvl: <7 mg/dL — ABNORMAL LOW (ref 7.0–30.0)

## 2022-04-07 MED ORDER — METOPROLOL SUCCINATE ER 50 MG PO TB24
50.0000 mg | ORAL_TABLET | Freq: Every day | ORAL | Status: DC
  Administered 2022-04-08: 50 mg via ORAL
  Filled 2022-04-07: qty 1

## 2022-04-07 MED ORDER — ATORVASTATIN CALCIUM 40 MG PO TABS
40.0000 mg | ORAL_TABLET | Freq: Every day | ORAL | Status: DC
  Administered 2022-04-08: 40 mg via ORAL
  Filled 2022-04-07: qty 1

## 2022-04-07 MED ORDER — LORAZEPAM 1 MG PO TABS
0.0000 mg | ORAL_TABLET | Freq: Four times a day (QID) | ORAL | Status: DC
  Administered 2022-04-07: 2 mg via ORAL
  Administered 2022-04-08 (×2): 1 mg via ORAL
  Filled 2022-04-07: qty 2
  Filled 2022-04-07 (×2): qty 1

## 2022-04-07 MED ORDER — ZOLPIDEM TARTRATE 5 MG PO TABS
5.0000 mg | ORAL_TABLET | Freq: Every day | ORAL | Status: DC
Start: 2022-04-07 — End: 2022-04-09
  Administered 2022-04-07 – 2022-04-08 (×2): 5 mg via ORAL
  Filled 2022-04-07 (×2): qty 1

## 2022-04-07 MED ORDER — ESCITALOPRAM OXALATE 10 MG PO TABS
20.0000 mg | ORAL_TABLET | Freq: Every morning | ORAL | Status: DC
  Administered 2022-04-08: 20 mg via ORAL
  Filled 2022-04-07: qty 2

## 2022-04-07 MED ORDER — LORAZEPAM 1 MG PO TABS: 0.0000 mg | ORAL_TABLET | Freq: Two times a day (BID) | ORAL | Status: DC

## 2022-04-07 MED ORDER — LORAZEPAM 1 MG PO TABS
1.0000 mg | ORAL_TABLET | Freq: Once | ORAL | Status: AC
  Administered 2022-04-07: 1 mg via ORAL
  Filled 2022-04-07: qty 1

## 2022-04-07 MED ORDER — THIAMINE HCL 100 MG/ML IJ SOLN: 100.0000 mg | Freq: Every day | INTRAMUSCULAR | Status: DC

## 2022-04-07 MED ORDER — LORAZEPAM 2 MG/ML IJ SOLN: 0.0000 mg | Freq: Four times a day (QID) | INTRAMUSCULAR | Status: DC

## 2022-04-07 MED ORDER — SUMATRIPTAN SUCCINATE 50 MG PO TABS
50.0000 mg | ORAL_TABLET | ORAL | Status: DC | PRN
  Filled 2022-04-07: qty 1

## 2022-04-07 MED ORDER — LORAZEPAM 2 MG/ML IJ SOLN: 0.0000 mg | Freq: Two times a day (BID) | INTRAMUSCULAR | Status: DC

## 2022-04-07 MED ORDER — THIAMINE HCL 100 MG PO TABS
100.0000 mg | ORAL_TABLET | Freq: Every day | ORAL | Status: DC
  Administered 2022-04-08: 100 mg via ORAL
  Filled 2022-04-07: qty 1

## 2022-04-07 MED ORDER — LORAZEPAM 1 MG PO TABS
2.0000 mg | ORAL_TABLET | Freq: Once | ORAL | Status: DC
Start: 2022-04-07 — End: 2022-04-09

## 2022-04-07 MED ORDER — ZOLPIDEM TARTRATE 5 MG PO TABS
10.0000 mg | ORAL_TABLET | Freq: Every day | ORAL | Status: DC
Start: 2022-04-07 — End: 2022-04-07

## 2022-04-07 MED ORDER — AMITRIPTYLINE HCL 25 MG PO TABS
25.0000 mg | ORAL_TABLET | Freq: Every day | ORAL | Status: DC
  Administered 2022-04-08 (×2): 50 mg via ORAL
  Filled 2022-04-07 (×2): qty 2

## 2022-04-07 MED ORDER — CLONAZEPAM 0.5 MG PO TABS: 0.5000 mg | ORAL_TABLET | Freq: Two times a day (BID) | ORAL | Status: DC | PRN

## 2022-04-07 MED ORDER — LORAZEPAM 2 MG/ML IJ SOLN
1.0000 mg | Freq: Once | INTRAMUSCULAR | Status: DC
Start: 2022-04-07 — End: 2022-04-07

## 2022-04-07 NOTE — ED Notes (Signed)
Patient gave permission to give information to her son, [name]. His information has been put in her demographics. He was asking how she was and what the plan was.  ?

## 2022-04-07 NOTE — ED Triage Notes (Signed)
Patient to room 30. Patient to [facility] by EMS. Patient having nervous breakdown.  Depressed. ?

## 2022-04-07 NOTE — ED Notes (Signed)
Patient very anxious coming to door multiple times asking multiple questions. Notified Dr. Vanita Panda. New order for '1mg'$  PO ativan ordered and given. Will continue to monitor.  ?

## 2022-04-07 NOTE — ED Provider Notes (Signed)
?[name] DEPT ?Provider Note ? ? ?CSN: [id number] ?Arrival date & time: 04/07/22  1742 ? ?  ? ?History ? ?Chief Complaint  ?Patient presents with  ? Psychiatric Evaluation  ? ? ?[name] is a 61 y.o. female. ? ?HPI ?Patient presents with depression, suicidal ideation.  She acknowledges a history of alcohol use, depression, states that she sees a Teacher, music and a Social worker.  Today, she has had worsening despondency, and new suicidal ideation.  No focal physical pain that is new, though she notes ongoing chronic right-sided discomfort.  No fever, fall, trauma.  No new medication and it is unclear if she is taking her medication as previously prescribed. ?  ? ?Home Medications ?Prior to Admission medications   ?Medication Sig Start Date End Date Taking? Authorizing Provider  ?amitriptyline (ELAVIL) 25 MG tablet Take 25-50 mg by mouth at bedtime. 09/17/21   [provider]  ?atorvastatin (LIPITOR) 40 MG tablet Take 40 mg by mouth daily. 08/05/21   [provider]  ?clonazePAM (KLONOPIN) 0.5 MG tablet Take 0.5 mg by mouth 2 (two) times daily as needed for anxiety.    [provider]  ?eletriptan (RELPAX) 40 MG tablet Take 20 mg by mouth 2 (two) times daily as needed for migraine or headache. One tablet by mouth at onset of headache. May repeat in 2 hours if headache persists or recurs.    [provider]  ?escitalopram (LEXAPRO) 20 MG tablet Take 20 mg by mouth every morning.     [provider]  ?esomeprazole (NEXIUM) 40 MG capsule Take 40 mg by mouth as needed.    [provider]  ?estradiol (VIVELLE-DOT) 0.0375 MG/24HR 1 patch 2 (two) times a week. 07/28/21   [provider]  ?FLUZONE QUADRIVALENT 0.5 ML injection  09/17/21   [provider]  ?HYDROcodone-acetaminophen (NORCO/VICODIN) 5-325 MG tablet Take 1 tablet by mouth 3 (three) times daily as needed. 09/12/21   [provider]  ?meloxicam (MOBIC) 15  MG tablet Take 1 tablet (15 mg total) by mouth daily. ?Patient not taking: No sig reported 06/22/20   Mesner, Corene Cornea, MD  ?metoprolol succinate (TOPROL-XL) 50 MG 24 hr tablet Take 50 mg by mouth daily. 09/16/21   [provider]  ?oxyCODONE-acetaminophen (ROXICET) 5-325 MG per tablet Take 1-2 tablets by mouth every 6 (six) hours as needed for moderate pain or severe pain. Post-operatively ?Patient not taking: No sig reported 11/12/14   Alexis Frock, MD  ?rizatriptan (MAXALT) 5 MG tablet SMARTSIG:1-2 Tablet(s) By Mouth 1-2 Times Daily PRN 11/30/21   [provider]  ?senna-docusate (SENOKOT-S) 8.6-50 MG per tablet Take 1 tablet by mouth 2 (two) times daily. While taking pain meds to prevent constipation ?Patient not taking: No sig reported 11/12/14   Alexis Frock, MD  ?zolpidem (AMBIEN) 10 MG tablet Take 10 mg by mouth at bedtime. 09/11/21   [provider]  ?   ? ?Allergies    ?Patient has no known allergies.   ? ?Review of Systems   ?Review of Systems  ?All other systems reviewed and are negative. ? ?Physical Exam ?Updated Vital Signs ?BP 134/82 (BP Location: Right Arm)   Pulse (!) 127   Temp 97.7 ?F (36.5 ?C)   Resp (!) 22   LMP 02/09/2014 Comment: AUB  SpO2 97%  ?Physical Exam ?Vitals and nursing note reviewed.  ?Constitutional:   ?   General: She is not in acute distress. ?   Appearance: She is well-developed.  ?  HENT:  ?   Head: Normocephalic and atraumatic.  ?Eyes:  ?   Conjunctiva/sclera: Conjunctivae normal.  ?Cardiovascular:  ?   Rate and Rhythm: Normal rate and regular rhythm.  ?Pulmonary:  ?   Effort: Pulmonary effort is normal. No respiratory distress.  ?   Breath sounds: Normal breath sounds. No stridor.  ?Abdominal:  ?   General: There is no distension.  ?Skin: ?   General: Skin is warm and dry.  ?Neurological:  ?   Mental Status: She is alert and oriented to person, place, and time.  ?   Cranial Nerves: No cranial nerve deficit.  ?Psychiatric:     ?   Mood and Affect:  Affect is labile.     ?   Thought Content: Thought content includes suicidal ideation.  ? ? ?ED Results / Procedures / Treatments   ?Labs ?(all labs ordered are listed, but only abnormal results are displayed) ?Labs Reviewed  ?COMPREHENSIVE METABOLIC PANEL - Abnormal; Notable for the following components:  ?    Result Value  ? CO2 20 (*)   ? Glucose, Bld 139 (*)   ? AST 375 (*)   ? ALT 176 (*)   ? Alkaline Phosphatase 132 (*)   ? Total Bilirubin 1.8 (*)   ? All other components within normal limits  ?SALICYLATE LEVEL - Abnormal; Notable for the following components:  ? Salicylate Lvl <6.2 (*)   ? All other components within normal limits  ?ACETAMINOPHEN LEVEL - Abnormal; Notable for the following components:  ? Acetaminophen (Tylenol), Serum <10 (*)   ? All other components within normal limits  ?ETHANOL - Abnormal; Notable for the following components:  ? Alcohol, Ethyl (B) 309 (*)   ? All other components within normal limits  ?CBC WITH DIFFERENTIAL/PLATELET - Abnormal; Notable for the following components:  ? RBC 5.16 (*)   ? Hemoglobin 16.7 (*)   ? HCT 47.6 (*)   ? Platelets 121 (*)   ? All other components within normal limits  ?RESP PANEL BY RT-PCR (FLU A&B, COVID) ARPGX2  ? ? ?EKG ?None ? ?Radiology ?No results found. ? ?Procedures ?Procedures  ? ? ?Medications Ordered in ED ?Medications  ?LORazepam (ATIVAN) injection 0-4 mg ( Intravenous See Alternative 04/07/22 1842)  ?  Or  ?LORazepam (ATIVAN) tablet 0-4 mg (0 mg Oral Not Given 04/07/22 1842)  ?LORazepam (ATIVAN) injection 0-4 mg (has no administration in time range)  ?  Or  ?LORazepam (ATIVAN) tablet 0-4 mg (has no administration in time range)  ?thiamine tablet 100 mg (100 mg Oral Patient Refused/Not Given 04/07/22 1842)  ?  Or  ?thiamine (B-1) injection 100 mg ( Intravenous See Alternative 04/07/22 1842)  ?LORazepam (ATIVAN) tablet 2 mg (has no administration in time range)  ?LORazepam (ATIVAN) tablet 1 mg (1 mg Oral Given 04/07/22 2218)  ? ? ?ED Course/ Medical  Decision Making/ A&P ?This patient with a Hx of depression, alcohol use presents to the ED for concern of suicidal ideation, this involves an extensive number of treatment options, and is a complaint that carries with it a high risk of complications and morbidity.   ? ?The differential diagnosis includes suicidal ideation, complication of alcohol abuse and/or withdrawal, though no early evidence for withdrawal with no seizures. ? ? ?Social Determinants of Health: ? ?Alcohol abuse, psychiatric disease ? ?Additional history obtained: ? ?Additional history and/or information obtained from chart review, notable for behavioral health evaluation alcohol intoxication with complication last year ? ? ?After the  initial evaluation, orders, including: Labs, CIWA protocol, Ativan as needed were initiated. ? ? ? ?The patient was also maintained on pulse oximetry. The readings were typically 100% room air ? ?Lab Tests: ? ?I personally interpreted labs.  The pertinent results include: Alcohol greater than 300, some hepatic dysfunction consistent with alcohol abuse, COVID-negative ? ? ?Consultations Obtained: ? ?I requested consultation with the behavioral health,  and patient will have evaluation.   ? ?Dispostion / Final MDM: ? ?After consideration of the diagnostic results and the patient's response to treatment, patient awaiting behavior health evaluation. ?Adult female with history of depression, presents with suicidal ideation after an unsettling event with neighbor.  Patient has some insight into presentation, acknowledges long history of depression, for which she is seeing her psychiatrist.  With new suicidal ideation, patient is appropriate for behavior health evaluation.  Given her substantial alcohol use history, and evidence for clinical intoxication patient received Ativan, started on CIWA protocol, though there is no early evidence for complicated withdrawal. ? ?Patient voluntary, and when sober may be appropriate for  contracting for safety. ? ?Final Clinical Impression(s) / ED Diagnoses ?Final diagnoses:  ?Suicidal ideation  ?Alcohol abuse  ? ?  ?Carmin Muskrat, MD ?04/07/22 2314 ? ?

## 2022-04-08 DIAGNOSIS — F1999 Other psychoactive substance use, unspecified with unspecified psychoactive substance-induced disorder: Secondary | ICD-10-CM | POA: Diagnosis present

## 2022-04-08 DIAGNOSIS — F1020 Alcohol dependence, uncomplicated: Secondary | ICD-10-CM | POA: Diagnosis present

## 2022-04-08 LAB — CBG MONITORING, ED: Glucose-Capillary: 220 mg/dL — ABNORMAL HIGH (ref 70–99)

## 2022-04-08 MED ORDER — LORAZEPAM 1 MG PO TABS
1.0000 mg | ORAL_TABLET | Freq: Once | ORAL | Status: AC
  Administered 2022-04-08: 1 mg via ORAL
  Filled 2022-04-08: qty 1

## 2022-04-08 MED ORDER — ENSURE ENLIVE PO LIQD
237.0000 mL | Freq: Two times a day (BID) | ORAL | Status: DC
  Administered 2022-04-08 (×2): 237 mL via ORAL
  Filled 2022-04-08 (×5): qty 237
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
## 2022-04-08 NOTE — Consult Note (Signed)
Telepsych Consultation  ? ?Reason for Consult:  psych consult ?Referring Physician:  Carmin Muskrat, MD ?Location of Patient:  [facility] ?Location of Provider: [facility] Department ? 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  Alcohol dependence ([place]) ? ? ?Total Time spent with patient: 20 minutes ? ?Subjective:   ?[name] is a 61 y.o. female patient admitted with suicidal ideation. ? ?Patient presents alert and oriented. States she is going through a really hard time after partner left her 10 days ago after 13 years and promised to pay mortgage. Says she's been staying in her house, poor sleep, increased alcohol intake (2-3 glasses of Pinot Grigio), poor appetite (only drinking Ovaltine, Ensure), low interest. Has outpatient therapist. Is a retired Corporate treasurer; currently does not make enough to cover all the bills, both are on deed. States ex-husband did similar thing and left her when her children were 7, 3 Lysbeth Galas 71, Alex 28). Most recent BAL 309; denies any history of DT or seizures. She is requesting discharge; has outpatient therapist. States 2 sons are supports. Denies any intent, thoughts, or plans to harm herself; states she could leave her children behind, just having a hard time. Denies access to any weapons. See psychiatrist x1 q6 mos, therapist weekly; states therapist has been texting her daily. She denies any thoughts to harm her partner or anyone, auditory or visual hallucinations. Provider discussed at length treatment options including inpatient detox, rehabilitation centers; pt states she'd prefer to "do it on my own at home like I've done before" and declines inpatient treatment at this time.  ?Provided verbal permission to speak with son [name].  ? ?Collateral: Sharol Given (son) [phone] ?Denies any immediate  concerns regarding mom's safety. Says he is worried that she will go home restart same behaviors and she eventually harm herself via alcohol. Expressed concern mostly regarding alcohol consumption. States he and his brother speak to his mother daily, increased concern regarding alcohol intake and decreased food consumption.  ? ?HPI:  [name] is a 61 year old female patient with past history of alcohol dependence with uncomplicated withdrawal, alcohol dependence who presented voluntarily to [facility] with chief complaint of "having a nervous breakdown, depressed" following break up with her long-term partner and increased alcohol intake. Labs: AST: 375, ALT 176, ALK PHOS 132, PLT 121. BAL 309, UDS-. Tremors noted.  ? ?Past Psychiatric History: alcohol dependence with uncomplicated withdrawal, alcohol dependence,  ? ?Risk to Self:  pt denies ?Risk to Others:  pt denies ?Prior Inpatient Therapy:  yes ?Prior Outpatient Therapy:   ? ?Past Medical History:  ?Past Medical History:  ?Diagnosis Date  ? Alcohol dependence ([place])   ? recent IVC at [facility] 10/ 2015 by son--  11-07-2014 pt states no alcohol since 09-11-2014 (approx)  ? Anxiety   ? Frequency of urination   ? GERD (gastroesophageal reflux disease)   ? Headache(784.0)   ? Hypertension   ? no meds per pt,  states bp been ok   ? Major depression   ? PTSD (post-traumatic stress disorder)   ? Renal calculus, right   ? Right ureteral stone   ? Urgency of urination   ?  ?Past Surgical History:  ?Procedure Laterality Date  ? BILATERAL SALPINGECTOMY Bilateral 03/19/2014  ? Procedure: BILATERAL SALPINGECTOMY;  Surgeon: Lovenia Kim, MD;  Location: [facility];  Service: Gynecology;  Laterality: Bilateral;  ?  [place]  ? HOLMIUM LASER APPLICATION Right 48/0/1655  ? Procedure: HOLMIUM LASER APPLICATION;  Surgeon: Alexis Frock, MD;  Location: [facility];  Service: Urology;  Laterality: Right;  ? ROBOTIC ASSISTED TOTAL HYSTERECTOMY N/A 03/19/2014  ?  Procedure: ROBOTIC ASSISTED TOTAL HYSTERECTOMY;  Surgeon: Lovenia Kim, MD;  Location: [facility];  Service: Gynecology;  Laterality: N/A;  ? 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 ?Family History:  ?Family History  ?Problem Relation Age of Onset  ? Alcohol abuse Father   ? ?Family Psychiatric  History: not noted ?Social History:  ?Social History  ? ?Substance and Sexual Activity  ?Alcohol Use Yes  ? Alcohol/week: 20.0 standard drinks  ? Types: 10 Glasses of wine, 10 Shots of liquor per week  ? Comment: last alcohol 55 days ago (approx. 09-11-2014)  ?   ?Social History  ? ?Substance and Sexual Activity  ?Drug Use Yes  ? Types: Marijuana, Benzodiazepines  ? Comment: last marjuana 2013/  ambien dependence  ?  ?Social History  ? ?Socioeconomic History  ? Marital status: Divorced  ?  Spouse name: Not on file  ? Number of children: Not on file  ? Years of education: Not on file  ? Highest education level: Not on file  ?Occupational History  ? Not on file  ?Tobacco Use  ? Smoking status: Former  ?  Types: Cigarettes  ?  Quit date: 09/11/2014  ?  Years since quitting: 7.5  ? Smokeless tobacco: Never  ?Substance and Sexual Activity  ? Alcohol use: Yes  ?  Alcohol/week: 20.0 standard drinks  ?  Types: 10 Glasses of wine, 10 Shots of liquor per week  ?  Comment: last alcohol 55 days ago (approx. 09-11-2014)  ? Drug use: Yes  ?  Types: Marijuana, Benzodiazepines  ?  Comment: last marjuana 2013/  ambien dependence  ? Sexual activity: Not on file  ?Other Topics Concern  ? Not on file  ?Social History Narrative  ? Not on file  ? ?Social Determinants of Health  ? ?Financial Resource Strain: Not on file  ?Food Insecurity: Not on file  ?Transportation Needs: Not on file  ?Physical Activity: Not on file  ?Stress: Not on file  ?Social Connections: Not on file  ? ?Additional Social History: ?  ? ?Allergies:  No Known Allergies ? ?Labs:  ?Results for orders placed or  performed during the hospital encounter of 04/07/22 (from the past 48 hour(s))  ?Comprehensive metabolic panel     Status: Abnormal  ? Collection Time: 04/07/22  6:12 PM  ?Result Value Ref Range  ? Sodium 137 135 - 145 mmol/L  ? Potassium 4.2 3.5 - 5.1 mmol/L  ? Chloride 102 98 - 111 mmol/L  ? CO2 20 (L) 22 - 32 mmol/L  ? Glucose, Bld 139 (H) 70 - 99 mg/dL  ?  Comment: Glucose reference range applies only to samples taken after fasting for at least 8 hours.  ? BUN 19 6 - 20 mg/dL  ? Creatinine, Ser 0.84 0.44 - 1.00 mg/dL  ? Calcium 9.5 8.9 - 10.3 mg/dL  ? Total Protein 7.6 6.5 - 8.1 g/dL  ? Albumin 4.3 3.5 - 5.0 g/dL  ? AST 375 (H) 15 - 41 U/L  ? ALT 176 (H) 0 - 44 U/L  ? Alkaline Phosphatase 132 (H) 38 - 126 U/L  ? Total Bilirubin 1.8 (H) 0.3 - 1.2 mg/dL  ?  GFR, Estimated >60 >60 mL/min  ?  Comment: (NOTE) ?Calculated using the CKD-EPI Creatinine Equation (2021) ?  ? Anion gap 15 5 - 15  ?  Comment: Performed at [facility], [place] [address] [place], [place] [address]  ?Salicylate level     Status: Abnormal  ? Collection Time: 04/07/22  6:12 PM  ?Result Value Ref Range  ? Salicylate Lvl <9.6 (L) 7.0 - 30.0 mg/dL  ?  Comment: Performed at [facility], [place] [address], [place], [place] [address]  ?Acetaminophen level     Status: Abnormal  ? Collection Time: 04/07/22  6:12 PM  ?Result Value Ref Range  ? Acetaminophen (Tylenol), Serum <10 (L) 10 - 30 ug/mL  ?  Comment: (NOTE) ?Therapeutic concentrations vary significantly. A range of 10-30 ug/mL  ?may be an effective concentration for many patients. However, some  ?are best treated at concentrations outside of this range. ?Acetaminophen concentrations >150 ug/mL at 4 hours after ingestion  ?and >50 ug/mL at 12 hours after ingestion are often associated with  ?toxic reactions. ? ?Performed at [facility], [address] ?[place], [place] [address] ?  ?Ethanol     Status: Abnormal  ? Collection Time: 04/07/22   6:12 PM  ?Result Value Ref Range  ? Alcohol, Ethyl (B) 309 (HH) <10 mg/dL  ?  Comment: CRITICAL RESULT CALLED TO, READ BACK BY AND VERIFIED WITH: ?RN B WILLIS AT 1858 04/07/22 CRUICKSHANK A ?(NOTE) ?Lowest detectab

## 2022-04-08 NOTE — Discharge Instructions (Signed)
To help you maintain a sober lifestyle, a substance abuse treatment program may be beneficial to you.  Listed below are Chemical Dependency Intensive Outpatient Programs (CD-IOP).  They meet several hours a day, several days a week.  Contact one of them at your earliest opportunity to ask about enrolling their program: ? ?     [facility] ?     [address] ?     [place], [place] [address] ?     Contact person: Adam Phenix, LCSW ?     [phone] ? ?     [facility] ?     [place] ?     [place],  [address] ?     [phone]  ?

## 2022-04-08 NOTE — ED Notes (Signed)
Patient was very anxious for several hours at beginning of shift. After TTS, ativan and nighttime medications, patient slept rest of the shift.  ?

## 2022-04-09 NOTE — ED Notes (Addendum)
Post administration of ambien pt slept for approx 1 hour after repeatedly asking for [employer]. She then came to nurses station asking for coffee. Pt advised that since she had just received her sleep medications, she would not be getting coffee. Pt repeatedly to the nurses station asking for various thing such as to call her sister (at 2am) and stating she has been here for days (she has not). Pt reminded several times that she cannot make phone calls right now and that her lunch which she repeatedly asks for will be served at lunch time and not at 2:30 in the morning. Needs constant reminder of time and parameters set forth for unit. Pt was also observed several times while she was alone in the room talking to the wall, and touching and arranging items in bizarre fashion.  ?

## 2022-04-09 NOTE — ED Notes (Signed)
Pt now out of room because she states "a bird flew out of her vent". Reassured pt that there was no bird in the vent or in her room. Will continue to monitor. ?

## 2022-04-09 NOTE — ED Notes (Signed)
Pt continues to come out of room and asking bizarre questions such as "are you going to try to kill me", assured patient I was not doing so. Also states "youre trying to get me put in a mental facility". Pt reoriented to department and expectations of her while she is here, to include not being disruptive to other patients and staff. ?

## 2022-04-09 NOTE — ED Provider Notes (Addendum)
Emergency Medicine Observation Re-evaluation Note ? ?[name] is a 61 y.o. female, seen on rounds today at 0700.  Pt initially presented to the ED for complaints of Psychiatric Evaluation ?Currently, the patient is resting comfortably. ? ?Physical Exam  ?BP (!) 153/99 (BP Location: Left Arm)   Pulse 82   Temp 97.6 ?F (36.4 ?C) (Oral)   Resp 16   LMP 02/09/2014 Comment: AUB  SpO2 96%  ?Physical Exam ?General: NAD ? ? ?ED Course / MDM  ?EKG:  ? ?I have reviewed the labs performed to date as well as medications administered while in observation.  Recent changes in the last 24 hours include no acute events reported. ? ?Plan  ?Current plan is for psych re-evaluation. ? KATRISHA SEGALL is not under involuntary commitment. ? ?0900 - Patient is now clinically sober.  She desires discharge home.  She is without current acute complaint that would require IVC hold. ? ?She has capacity to refuse care.  She does understand the options for outpatient therapy.  Strict return precautions given and understood. ? ? ?  ?Valarie Merino, MD ?04/09/22 [phone] ? ?  ?Valarie Merino, MD ?04/09/22 [phone] ? ?

## 2022-05-15 ENCOUNTER — Other Ambulatory Visit (INDEPENDENT_AMBULATORY_CARE_PROVIDER_SITE_OTHER)
Admission: EM | Admit: 2022-05-15 | Discharge: 2022-05-16 | Disposition: A | Discharge status: Other Acute Inpatient Hospital | Payer: Medicare HMO | Source: Home / Self Care | Attending: Psychiatry | Admitting: Psychiatry

## 2022-05-15 DIAGNOSIS — Z811 Family history of alcohol abuse and dependence: Secondary | ICD-10-CM | POA: Diagnosis not present

## 2022-05-15 DIAGNOSIS — F10930 Alcohol use, unspecified with withdrawal, uncomplicated: Secondary | ICD-10-CM

## 2022-05-15 DIAGNOSIS — Z87891 Personal history of nicotine dependence: Secondary | ICD-10-CM | POA: Diagnosis not present

## 2022-05-15 DIAGNOSIS — F411 Generalized anxiety disorder: Secondary | ICD-10-CM | POA: Diagnosis present

## 2022-05-15 DIAGNOSIS — R21 Rash and other nonspecific skin eruption: Secondary | ICD-10-CM | POA: Diagnosis not present

## 2022-05-15 DIAGNOSIS — S199XXA Unspecified injury of neck, initial encounter: Secondary | ICD-10-CM | POA: Diagnosis not present

## 2022-05-15 DIAGNOSIS — R7401 Elevation of levels of liver transaminase levels: Secondary | ICD-10-CM | POA: Diagnosis not present

## 2022-05-15 DIAGNOSIS — F329 Major depressive disorder, single episode, unspecified: Secondary | ICD-10-CM | POA: Diagnosis present

## 2022-05-15 DIAGNOSIS — E785 Hyperlipidemia, unspecified: Secondary | ICD-10-CM | POA: Diagnosis present

## 2022-05-15 DIAGNOSIS — F1994 Other psychoactive substance use, unspecified with psychoactive substance-induced mood disorder: Secondary | ICD-10-CM | POA: Insufficient documentation

## 2022-05-15 DIAGNOSIS — K5900 Constipation, unspecified: Secondary | ICD-10-CM | POA: Diagnosis not present

## 2022-05-15 DIAGNOSIS — N390 Urinary tract infection, site not specified: Secondary | ICD-10-CM | POA: Diagnosis not present

## 2022-05-15 DIAGNOSIS — K219 Gastro-esophageal reflux disease without esophagitis: Secondary | ICD-10-CM | POA: Diagnosis present

## 2022-05-15 DIAGNOSIS — F10230 Alcohol dependence with withdrawal, uncomplicated: Secondary | ICD-10-CM | POA: Insufficient documentation

## 2022-05-15 DIAGNOSIS — F05 Delirium due to known physiological condition: Secondary | ICD-10-CM | POA: Diagnosis not present

## 2022-05-15 DIAGNOSIS — Z20822 Contact with and (suspected) exposure to covid-19: Secondary | ICD-10-CM | POA: Insufficient documentation

## 2022-05-15 DIAGNOSIS — S0083XA Contusion of other part of head, initial encounter: Secondary | ICD-10-CM | POA: Diagnosis not present

## 2022-05-15 DIAGNOSIS — W19XXXA Unspecified fall, initial encounter: Secondary | ICD-10-CM | POA: Diagnosis not present

## 2022-05-15 DIAGNOSIS — R45851 Suicidal ideations: Secondary | ICD-10-CM | POA: Insufficient documentation

## 2022-05-15 DIAGNOSIS — Z79899 Other long term (current) drug therapy: Secondary | ICD-10-CM | POA: Insufficient documentation

## 2022-05-15 DIAGNOSIS — R251 Tremor, unspecified: Secondary | ICD-10-CM | POA: Insufficient documentation

## 2022-05-15 DIAGNOSIS — S0003XA Contusion of scalp, initial encounter: Secondary | ICD-10-CM | POA: Diagnosis not present

## 2022-05-15 DIAGNOSIS — F1010 Alcohol abuse, uncomplicated: Secondary | ICD-10-CM

## 2022-05-15 DIAGNOSIS — R519 Headache, unspecified: Secondary | ICD-10-CM | POA: Insufficient documentation

## 2022-05-15 DIAGNOSIS — F1914 Other psychoactive substance abuse with psychoactive substance-induced mood disorder: Secondary | ICD-10-CM | POA: Diagnosis not present

## 2022-05-15 DIAGNOSIS — F29 Unspecified psychosis not due to a substance or known physiological condition: Secondary | ICD-10-CM | POA: Diagnosis not present

## 2022-05-15 DIAGNOSIS — R296 Repeated falls: Secondary | ICD-10-CM | POA: Diagnosis present

## 2022-05-15 DIAGNOSIS — R451 Restlessness and agitation: Secondary | ICD-10-CM | POA: Diagnosis not present

## 2022-05-15 DIAGNOSIS — F4310 Post-traumatic stress disorder, unspecified: Secondary | ICD-10-CM | POA: Diagnosis present

## 2022-05-15 DIAGNOSIS — F332 Major depressive disorder, recurrent severe without psychotic features: Secondary | ICD-10-CM | POA: Diagnosis not present

## 2022-05-15 DIAGNOSIS — D696 Thrombocytopenia, unspecified: Secondary | ICD-10-CM | POA: Diagnosis not present

## 2022-05-15 DIAGNOSIS — E079 Disorder of thyroid, unspecified: Secondary | ICD-10-CM | POA: Diagnosis present

## 2022-05-15 DIAGNOSIS — Z9079 Acquired absence of other genital organ(s): Secondary | ICD-10-CM | POA: Diagnosis not present

## 2022-05-15 DIAGNOSIS — F0152 Vascular dementia, unspecified severity, with psychotic disturbance: Secondary | ICD-10-CM | POA: Diagnosis not present

## 2022-05-15 DIAGNOSIS — R410 Disorientation, unspecified: Secondary | ICD-10-CM | POA: Diagnosis not present

## 2022-05-15 DIAGNOSIS — Z602 Problems related to living alone: Secondary | ICD-10-CM | POA: Diagnosis present

## 2022-05-15 DIAGNOSIS — R945 Abnormal results of liver function studies: Secondary | ICD-10-CM | POA: Diagnosis not present

## 2022-05-15 DIAGNOSIS — G4700 Insomnia, unspecified: Secondary | ICD-10-CM | POA: Diagnosis present

## 2022-05-15 DIAGNOSIS — Z90710 Acquired absence of both cervix and uterus: Secondary | ICD-10-CM | POA: Diagnosis not present

## 2022-05-15 LAB — CBC WITH DIFFERENTIAL/PLATELET
Abs Immature Granulocytes: 0.01 10*3/uL (ref 0.00–0.07)
Basophils Absolute: 0 10*3/uL (ref 0.0–0.1)
Basophils Relative: 0 %
Eosinophils Absolute: 0.1 10*3/uL (ref 0.0–0.5)
Eosinophils Relative: 2 %
HCT: 40.2 % (ref 36.0–46.0)
Hemoglobin: 13.7 g/dL (ref 12.0–15.0)
Immature Granulocytes: 0 %
Lymphocytes Relative: 30 %
Lymphs Abs: 1.1 10*3/uL (ref 0.7–4.0)
MCH: 33.7 pg (ref 26.0–34.0)
MCHC: 34.1 g/dL (ref 30.0–36.0)
MCV: 98.8 fL (ref 80.0–100.0)
Monocytes Absolute: 0.3 10*3/uL (ref 0.1–1.0)
Monocytes Relative: 7 %
Neutro Abs: 2.2 10*3/uL (ref 1.7–7.7)
Neutrophils Relative %: 61 %
Platelets: 63 10*3/uL — ABNORMAL LOW (ref 150–400)
RBC: 4.07 MIL/uL (ref 3.87–5.11)
RDW: 15.9 % — ABNORMAL HIGH (ref 11.5–15.5)
WBC: 3.6 10*3/uL — ABNORMAL LOW (ref 4.0–10.5)
nRBC: 0 % (ref 0.0–0.2)

## 2022-05-15 LAB — LIPID PANEL
Cholesterol: 217 mg/dL — ABNORMAL HIGH (ref 0–200)
HDL: 96 mg/dL (ref >40)
LDL Cholesterol: 99 mg/dL (ref 0–99)
Total CHOL/HDL Ratio: 2.3 RATIO
Triglycerides: 111 mg/dL (ref <150)
VLDL: 22 mg/dL (ref 0–40)

## 2022-05-15 LAB — URINALYSIS, COMPLETE (UACMP) WITH MICROSCOPIC
Bilirubin Urine: NEGATIVE
Glucose, UA: NEGATIVE mg/dL
Hgb urine dipstick: NEGATIVE
Ketones, ur: NEGATIVE mg/dL
Nitrite: NEGATIVE
Protein, ur: NEGATIVE mg/dL
Specific Gravity, Urine: 1.01 (ref 1.005–1.030)
pH: 5 (ref 5.0–8.0)

## 2022-05-15 LAB — COMPREHENSIVE METABOLIC PANEL
ALT: 145 U/L — ABNORMAL HIGH (ref 0–44)
AST: 312 U/L — ABNORMAL HIGH (ref 15–41)
Albumin: 3.9 g/dL (ref 3.5–5.0)
Alkaline Phosphatase: 135 U/L — ABNORMAL HIGH (ref 38–126)
Anion gap: 12 (ref 5–15)
BUN: 11 mg/dL (ref 6–20)
CO2: 27 mmol/L (ref 22–32)
Calcium: 9.8 mg/dL (ref 8.9–10.3)
Chloride: 96 mmol/L — ABNORMAL LOW (ref 98–111)
Creatinine, Ser: 0.72 mg/dL (ref 0.44–1.00)
GFR, Estimated: >60 mL/min (ref >60)
Glucose, Bld: 127 mg/dL — ABNORMAL HIGH (ref 70–99)
Potassium: 3.8 mmol/L (ref 3.5–5.1)
Sodium: 135 mmol/L (ref 135–145)
Total Bilirubin: 2.1 mg/dL — ABNORMAL HIGH (ref 0.3–1.2)
Total Protein: 7 g/dL (ref 6.5–8.1)

## 2022-05-15 LAB — HEPATITIS PANEL, ACUTE
HCV Ab: NONREACTIVE
Hep A IgM: NONREACTIVE
Hep B C IgM: NONREACTIVE
Hepatitis B Surface Ag: NONREACTIVE

## 2022-05-15 LAB — TSH: TSH: 2.163 u[IU]/mL (ref 0.350–4.500)

## 2022-05-15 LAB — POC SARS CORONAVIRUS 2 AG: SARSCOV2ONAVIRUS 2 AG: NEGATIVE

## 2022-05-15 LAB — RESP PANEL BY RT-PCR (FLU A&B, COVID) ARPGX2
Influenza A by PCR: NEGATIVE
Influenza B by PCR: NEGATIVE
SARS Coronavirus 2 by RT PCR: NEGATIVE

## 2022-05-15 MED ORDER — ADULT MULTIVITAMIN W/MINERALS CH
1.0000 | ORAL_TABLET | Freq: Every day | ORAL | Status: DC
  Administered 2022-05-15 – 2022-05-16 (×2): 1 via ORAL
  Filled 2022-05-15 (×2): qty 1

## 2022-05-15 MED ORDER — LORAZEPAM 1 MG PO TABS
1.0000 mg | ORAL_TABLET | Freq: Four times a day (QID) | ORAL | Status: AC
  Administered 2022-05-15 – 2022-05-16 (×4): 1 mg via ORAL
  Filled 2022-05-15 (×4): qty 1

## 2022-05-15 MED ORDER — MAGNESIUM HYDROXIDE 400 MG/5ML PO SUSP: 30.0000 mL | Freq: Every day | ORAL | Status: DC | PRN

## 2022-05-15 MED ORDER — LORAZEPAM 1 MG PO TABS: 1.0000 mg | ORAL_TABLET | Freq: Three times a day (TID) | ORAL | Status: DC

## 2022-05-15 MED ORDER — ATORVASTATIN CALCIUM 40 MG PO TABS
40.0000 mg | ORAL_TABLET | Freq: Every day | ORAL | Status: DC
  Administered 2022-05-15: 40 mg via ORAL
  Filled 2022-05-15: qty 1

## 2022-05-15 MED ORDER — THIAMINE HCL 100 MG/ML IJ SOLN
100.0000 mg | Freq: Once | INTRAMUSCULAR | Status: AC
  Administered 2022-05-15: 100 mg via INTRAMUSCULAR
  Filled 2022-05-15: qty 2

## 2022-05-15 MED ORDER — LORAZEPAM 1 MG PO TABS: 1.0000 mg | ORAL_TABLET | Freq: Every day | ORAL | Status: DC

## 2022-05-15 MED ORDER — THIAMINE HCL 100 MG PO TABS
100.0000 mg | ORAL_TABLET | Freq: Every day | ORAL | Status: DC
  Administered 2022-05-16: 100 mg via ORAL
  Filled 2022-05-15: qty 1

## 2022-05-15 MED ORDER — LORAZEPAM 1 MG PO TABS: 1.0000 mg | ORAL_TABLET | Freq: Two times a day (BID) | ORAL | Status: DC

## 2022-05-15 MED ORDER — LOPERAMIDE HCL 2 MG PO CAPS: 2.0000 mg | ORAL_CAPSULE | ORAL | Status: DC | PRN

## 2022-05-15 MED ORDER — ALUM & MAG HYDROXIDE-SIMETH 200-200-20 MG/5ML PO SUSP: 30.0000 mL | ORAL | Status: DC | PRN

## 2022-05-15 MED ORDER — LORAZEPAM 1 MG PO TABS
1.0000 mg | ORAL_TABLET | Freq: Four times a day (QID) | ORAL | Status: DC | PRN
  Administered 2022-05-15 – 2022-05-16 (×2): 1 mg via ORAL
  Filled 2022-05-15 (×2): qty 1

## 2022-05-15 MED ORDER — ACETAMINOPHEN 325 MG PO TABS
650.0000 mg | ORAL_TABLET | Freq: Four times a day (QID) | ORAL | Status: DC | PRN
  Administered 2022-05-16: 650 mg via ORAL
  Filled 2022-05-15: qty 2

## 2022-05-15 MED ORDER — ONDANSETRON 4 MG PO TBDP: 4.0000 mg | ORAL_TABLET | Freq: Four times a day (QID) | ORAL | Status: DC | PRN

## 2022-05-15 MED ORDER — ENSURE ENLIVE PO LIQD
237.0000 mL | Freq: Two times a day (BID) | ORAL | Status: DC
  Administered 2022-05-15: 237 mL via ORAL

## 2022-05-15 MED ORDER — TRAZODONE HCL 50 MG PO TABS
50.0000 mg | ORAL_TABLET | Freq: Every evening | ORAL | Status: DC | PRN
  Administered 2022-05-15: 50 mg via ORAL
  Filled 2022-05-15 (×2): qty 1

## 2022-05-15 MED ORDER — HYDROXYZINE HCL 25 MG PO TABS
25.0000 mg | ORAL_TABLET | Freq: Four times a day (QID) | ORAL | Status: DC | PRN
  Administered 2022-05-15: 25 mg via ORAL
  Filled 2022-05-15: qty 1

## 2022-05-15 NOTE — ED Notes (Signed)
Pt given meal, encouraged to eat and increase po fluid intake. Juice and water given.

## 2022-05-15 NOTE — ED Notes (Signed)
Pt talking on phone to family member. A&O x4, cooperative but slightly anxious. Denies SI/HI/AVH. Denies any current needs. No signs of acute distress noted. Will continue to monitor for safety.

## 2022-05-15 NOTE — ED Notes (Signed)
Pt given water cup and instructed for need for urine specimen.

## 2022-05-15 NOTE — Progress Notes (Signed)
Urine Rapid Drug screen completed. Pt urine positive for Benzo and Oxycodone. Unable to enter edit results due to Epic failure at this time. Provider made aware. Urine sent for Urinalysis to lab.

## 2022-05-15 NOTE — ED Notes (Signed)
Pt sleeping@this time. Breathing even and unlabored. Will continue to monitor for safety 

## 2022-05-15 NOTE — BH Assessment (Signed)
Comprehensive Clinical Assessment (CCA) Screening, Triage and Referral Note  05/15/2022 [name] [id number]  Disposition: Per Ricky Ala, NP, recommends [facility] ED from 05/15/2022 in [facility] ED from 04/07/2022 in [facility] ED from 10/07/2021 in [facility] [facility]PT  C-SSRS RISK CATEGORY No Risk Low Risk No Risk      The patient demonstrates the following risk factors for suicide: Chronic risk factors for suicide include: psychiatric disorder of depression and substance use disorder. Acute risk factors for suicide include: family or marital conflict and loss (financial, interpersonal, professional). Protective factors for this patient include: positive social support, positive therapeutic relationship, responsibility to others (children, family), and hope for the future. Considering these factors, the overall suicide risk at this point appears to be low. Patient is appropriate for outpatient follow up.  [name] is a 61 year old female presenting to [facility] voluntarily with chief complaint of alcohol problems and depression. Patient reports a history of alcohol abuse however for the past month she has been drinking up to 2 bottles of wine a night. Patient reports alcohol use has caused her problems in her relationship and reports that she fell Thursday while intoxicated causing her to bruise her arms. Patient reports a pattern of on and off drinking usually triggered by life events. Patient reports increased in alcohol consumption when she divorced her husband in 2001. Most recently her son separated from his wife, and she separated from her partner which triggered her to drink. Patient denies the use of any illegal drugs.  Patient reports diagnosis of GAD and depression and she has outpatient services with Dr. Wylene Simmer. Patient denies history of inpatient treatment. Patient received outpatient  services to address her alcohol and co-occurring mental health at [facility].  Patient is retired and disabled, and she now lives alone. Patient denies access to a firearm and denies legal issues.   Patient is oriented x4, engaged, alert and cooperative. Patient eye contact and speech is normal, her affect is appropriate somewhat anxious with congruent mood. Patient appears stated age. Patient reports passive SI without a plan. Patient reports SI is a fleeting thought however provides protective factors of her family who drove a good distance to ensure patient receive help today. Patient has never attempted suicide and contracts for safety. Patient denies HI, AVH and SIB, however reports weeks ago she experienced VH associated with alcohol use and/or dehydration.    Chief Complaint:  Chief Complaint  Patient presents with   Adjustment Disorder   Alcohol Problem   Visit Diagnosis:  Alcohol abuse  Substance induced mood disorder ([place])    Patient Reported Information How did you hear about [place]? Family/Friend  What Is the Reason for Your Visit/Call Today? Detox from alcohol use  How Long Has This Been Causing You Problems? > than 6 months  What Do You Feel Would Help You the Most Today? Alcohol or Drug Use Treatment   Have You Recently Had Any Thoughts About Hurting Yourself? Yes  Are You Planning to Commit Suicide/Harm Yourself At This time? No   Have you Recently Had Thoughts About [place]? No  Are You Planning to Harm Someone at This Time? No  Explanation: No data recorded  Have You Used Any Alcohol or Drugs in the Past 24 Hours? Yes  How Long Ago Did You Use Drugs or Alcohol? No data recorded What Did You Use and How Much? 1 bottle of wine  Do You Currently Have a Therapist/Psychiatrist? Yes  Name of Therapist/Psychiatrist: Dr Chucky May and Nunzio Cobbs, MSW   Have You Been Recently Discharged From Any Office Practice or Programs?  No  Explanation of Discharge From Practice/Program: No data recorded   CCA Screening Triage Referral Assessment Type of Contact: Tele-Assessment  Telemedicine Service Delivery:   Is this Initial or Reassessment? Initial Assessment  Date Telepsych consult ordered in [facility]:  04/07/22  Time Telepsych consult ordered in [facility]:  1814  Location of Assessment: [facility] ED  Provider Location: [facility]   Collateral Involvement: None   Does Patient Have a [place]? No data recorded Name and Contact of Legal Guardian: No data recorded If Minor and Not Living with Parent(s), Who has Custody? NA  Is CPS involved or ever been involved? Never  Is APS involved or ever been involved? Never   Patient Determined To Be At Risk for Harm To Self or Others Based on Review of Patient Reported Information or Presenting Complaint? No  Method: No data recorded Availability of Means: No data recorded Intent: No data recorded Notification Required: No data recorded Additional Information for Danger to Others Potential: No data recorded Additional Comments for Danger to Others Potential: No data recorded Are There Guns or Other Weapons in Your Home? No data recorded Types of Guns/Weapons: No data recorded Are These Weapons Safely Secured?                            No data recorded Who Could Verify You Are Able To Have These Secured: No data recorded Do You Have any Outstanding Charges, Pending Court Dates, Parole/Probation? No data recorded Contacted To Inform of Risk of Harm To Self or Others: No data recorded  Does Patient Present under Involuntary Commitment? No  IVC Papers Initial File Date: No data recorded  [place] of Residence: [place]   Patient Currently Receiving the Following Services: Medication Management; Individual Therapy   Determination of Need: Urgent (48 hours)   Options For Referral: Medication Management; Outpatient Therapy;  Facility-Based Crisis   Discharge Disposition:     Luther Redo, [facility]

## 2022-05-15 NOTE — BH Assessment (Signed)
[place] Provider Ricky Ala, NP) evaluated patient prior to the TTS screening/triage and determine that will be admitted/transferred to the [facility].   No triage/screening completed in the allotted time.

## 2022-05-15 NOTE — ED Provider Notes (Signed)
Facility Based Crisis Admission H&P  Date: 05/15/22 Patient Name: [name] MRN: [id number] Chief Complaint:  " I drink too much."    Diagnoses:  Final diagnoses:  Alcohol abuse  Substance induced mood disorder [facility])    HPI: [name] is a 61 year old female that presents to [facility] urgent care seeking substance abuse resources for alcohol and depression.  She is accompanied by family and friends.  States she has been drinking on and off for most of her life.  States her longest period of sobriety was 45 days last November or October. Reports drinking 1-2 bottles of wine daily.  Denied any other illicit drug use.  Reports she previously completed a chemical dependency program.  Denied inpatient admissions.  Reports passive suicidal ideations however denies plan or intent.  [name] is currently prescribed clonazepam, Ambien Lexapro for reported history with depression and posttraumatic stress disorder.  Denies history of withdrawal seizures and or tremors.  During evaluation [name] is sitting in no acute distress. She is alert/oriented x 4; calm/cooperative; and mood congruent with affect. She is speaking in a clear tone at moderate volume, and normal pace; with good eye contact. Her thought process is coherent and relevant; There is no indication that she is currently responding to internal/external stimuli or experiencing delusional thought content; and she has denied suicidal/self-harm/homicidal ideation, psychosis, and paranoia.   Patient has remained calm throughout assessment and has answered questions appropriately.      PHQ 2-9:   Flowsheet Row ED from 04/07/2022 in [facility] DEPT ED from 10/07/2021 in [facility]PT  C-SSRS RISK CATEGORY Low Risk No Risk        Total Time spent with patient: 15 minutes  Musculoskeletal  Strength & Muscle Tone: within normal limits Gait & Station: normal Patient  leans: N/A  Psychiatric Specialty Exam  Presentation General Appearance: Appropriate for Environment  Eye Contact:Good  Speech:Clear and Coherent  Speech Volume:Normal  Handedness:Right   Mood and Affect  Mood:Hopeless  Affect:Congruent   Thought Process  Thought Processes:Coherent  Descriptions of Associations:Intact  Orientation:Full (Time, Place and Person)  Thought Content:Logical  Diagnosis of Schizophrenia or Schizoaffective disorder in past: No   Hallucinations:Hallucinations: None  Ideas of Reference:None  Suicidal Thoughts:Suicidal Thoughts: Yes, Passive SI Passive Intent and/or Plan: Without Intent  Homicidal Thoughts:Homicidal Thoughts: No   Sensorium  Memory:Immediate Good; Recent Good  Judgment:Good  Insight:Fair   Executive Functions  Concentration:Fair  Attention Span:Good  [place]   Psychomotor Activity  Psychomotor Activity:Psychomotor Activity: Normal   Assets  Assets:Intimacy; Social Support; Desire for Improvement   Sleep  Sleep:Sleep: Fair   Nutritional Assessment (For OBS and FBC admissions only) Has the patient had a weight loss or gain of 10 pounds or more in the last 3 months?: No Has the patient had a decrease in food intake/or appetite?: No Does the patient have dental problems?: No Does the patient have eating habits or behaviors that may be indicators of an eating disorder including binging or inducing vomiting?: No Has the patient recently lost weight without trying?: 0 Has the patient been eating poorly because of a decreased appetite?: 0 Malnutrition Screening Tool Score: 0    Physical Exam Vitals and nursing note reviewed.  Cardiovascular:     Rate and Rhythm: Normal rate and regular rhythm.  Neurological:     Mental Status: She is alert and oriented to person, place, and time.  Psychiatric:  Attention and Perception: Attention normal.         Mood and Affect: Mood is depressed. Affect is tearful.        Speech: Speech normal.        Behavior: Behavior normal. Behavior is cooperative.        Thought Content: Thought content normal.        Cognition and Memory: Cognition and memory normal.        Judgment: Judgment normal.    Review of Systems  HENT: Negative.    Cardiovascular: Negative.   Gastrointestinal: Negative.   Skin: Negative.   Psychiatric/Behavioral:  Positive for depression. The patient is nervous/anxious.   All other systems reviewed and are negative.   Blood pressure (!) 145/100, pulse 95, temperature 98.1 F (36.7 C), temperature source Oral, resp. rate 20, last menstrual period 02/09/2014, SpO2 99 %. There is no height or weight on file to calculate BMI.  Past Psychiatric History:   Is the patient at risk to self? No  Has the patient been a risk to self in the past 6 months? No .    Has the patient been a risk to self within the distant past? No   Is the patient a risk to others? No   Has the patient been a risk to others in the past 6 months? No   Has the patient been a risk to others within the distant past? No   Past Medical History:  Past Medical History:  Diagnosis Date   Alcohol dependence ([place])    recent IVC at [facility] 10/ 2015 by son--  11-07-2014 pt states no alcohol since 09-11-2014 (approx)   Anxiety    Frequency of urination    GERD (gastroesophageal reflux disease)    Headache(784.0)    Hypertension    no meds per pt,  states bp been ok    Major depression    PTSD (post-traumatic stress disorder)    Renal calculus, right    Right ureteral stone    Urgency of urination     Past Surgical History:  Procedure Laterality Date   BILATERAL SALPINGECTOMY Bilateral 03/19/2014   Procedure: BILATERAL SALPINGECTOMY;  Surgeon: Lovenia Kim, MD;  Location: [facility];  Service: Gynecology;  Laterality: Bilateral;   CESAREAN SECTION  1991   HOLMIUM LASER APPLICATION Right 38/06/5642   Procedure:  HOLMIUM LASER APPLICATION;  Surgeon: Alexis Frock, MD;  Location: [facility];  Service: Urology;  Laterality: Right;   ROBOTIC ASSISTED TOTAL HYSTERECTOMY N/A 03/19/2014   Procedure: ROBOTIC ASSISTED TOTAL HYSTERECTOMY;  Surgeon: Lovenia Kim, MD;  Location: [facility];  Service: Gynecology;  Laterality: N/A;   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    Family History:  Family History  Problem Relation Age of Onset   Alcohol abuse Father     Social History:  Social History   Socioeconomic History   Marital status: Divorced    Spouse name: Not on file   Number of children: Not on file   Years of education: Not on file   Highest education level: Not on file  Occupational History   Not on file  Tobacco Use   Smoking status: Former    Types: Cigarettes    Quit date: 09/11/2014    Years since quitting: 7.6   Smokeless tobacco: Never  Substance and Sexual Activity   Alcohol use: Yes    Alcohol/week: 20.0 standard drinks of alcohol  Types: 10 Glasses of wine, 10 Shots of liquor per week    Comment: last alcohol 55 days ago (approx. 09-11-2014)   Drug use: Yes    Types: Marijuana, Benzodiazepines    Comment: last marjuana 2013/  ambien dependence   Sexual activity: Not on file  Other Topics Concern   Not on file  Social History Narrative   Not on file   Social Determinants of Health   Financial Resource Strain: Not on file  Food Insecurity: Not on file  Transportation Needs: Not on file  Physical Activity: Not on file  Stress: Not on file  Social Connections: Not on file  Intimate Partner Violence: Not on file    SDOH:  SDOH Screenings   Alcohol Screen: Not on file  Depression (CXK4-8): Not on file  Financial Resource Strain: Not on file  Food Insecurity: Not on file  Housing: Not on file  Physical Activity: Not on file  Social Connections: Not on file  Stress: Not on file   Tobacco Use: Medium Risk (12/14/2021)   Patient History    Smoking Tobacco Use: Former    Smokeless Tobacco Use: Never    Passive Exposure: Not on file  Transportation Needs: Not on file    Last Labs:  Admission on 04/07/2022, Discharged on 04/09/2022  Component Date Value Ref Range Status   SARS Coronavirus 2 by RT PCR 04/07/2022 NEGATIVE  NEGATIVE Final   Comment: (NOTE) SARS-CoV-2 target nucleic acids are NOT DETECTED.  The SARS-CoV-2 RNA is generally detectable in upper respiratory specimens during the acute phase of infection. The lowest concentration of SARS-CoV-2 viral copies this assay can detect is 138 copies/mL. A negative result does not preclude SARS-Cov-2 infection and should not be used as the sole basis for treatment or other patient management decisions. A negative result may occur with  improper specimen collection/handling, submission of specimen other than nasopharyngeal swab, presence of viral mutation(s) within the areas targeted by this assay, and inadequate number of viral copies(<138 copies/mL). A negative result must be combined with clinical observations, patient history, and epidemiological information. The expected result is Negative.  Fact Sheet for Patients:  EntrepreneurPulse.com.au  Fact Sheet for Healthcare Providers:  IncredibleEmployment.be  This test is no                          t yet approved or cleared by the [place] FDA and  has been authorized for detection and/or diagnosis of SARS-CoV-2 by FDA under an Emergency Use Authorization (EUA). This EUA will remain  in effect (meaning this test can be used) for the duration of the COVID-19 declaration under Section 564(b)(1) of the Act, 21 U.S.C.section 360bbb-3(b)(1), unless the authorization is terminated  or revoked sooner.       Influenza A by PCR 04/07/2022 NEGATIVE  NEGATIVE Final   Influenza B by PCR 04/07/2022 NEGATIVE  NEGATIVE Final    Comment: (NOTE) The Xpert Xpress SARS-CoV-2/FLU/RSV plus assay is intended as an aid in the diagnosis of influenza from Nasopharyngeal swab specimens and should not be used as a sole basis for treatment. Nasal washings and aspirates are unacceptable for Xpert Xpress SARS-CoV-2/FLU/RSV testing.  Fact Sheet for Patients: EntrepreneurPulse.com.au  Fact Sheet for Healthcare Providers: IncredibleEmployment.be  This test is not yet approved or cleared by the [place] FDA and has been authorized for detection and/or diagnosis of SARS-CoV-2 by FDA under an Emergency Use Authorization (EUA). This EUA will remain in effect (  meaning this test can be used) for the duration of the COVID-19 declaration under Section 564(b)(1) of the Act, 21 U.S.C. section 360bbb-3(b)(1), unless the authorization is terminated or revoked.  Performed at [facility], [place] [address] [place], [place] [address]    Sodium 04/07/2022 137  135 - 145 mmol/L Final   Potassium 04/07/2022 4.2  3.5 - 5.1 mmol/L Final   Chloride 04/07/2022 102  98 - 111 mmol/L Final   CO2 04/07/2022 20 (L)  22 - 32 mmol/L Final   Glucose, Bld 04/07/2022 139 (H)  70 - 99 mg/dL Final   Glucose reference range applies only to samples taken after fasting for at least 8 hours.   BUN 04/07/2022 19  6 - 20 mg/dL Final   Creatinine, Ser 04/07/2022 0.84  0.44 - 1.00 mg/dL Final   Calcium 04/07/2022 9.5  8.9 - 10.3 mg/dL Final   Total Protein 04/07/2022 7.6  6.5 - 8.1 g/dL Final   Albumin 04/07/2022 4.3  3.5 - 5.0 g/dL Final   AST 04/07/2022 375 (H)  15 - 41 U/L Final   ALT 04/07/2022 176 (H)  0 - 44 U/L Final   Alkaline Phosphatase 04/07/2022 132 (H)  38 - 126 U/L Final   Total Bilirubin 04/07/2022 1.8 (H)  0.3 - 1.2 mg/dL Final   GFR, Estimated 04/07/2022 >60  >60 mL/min Final   Comment: (NOTE) Calculated using the CKD-EPI Creatinine Equation (2021)    Anion gap 04/07/2022 15  5 - 15  Final   Performed at [facility], [place] [address] [place], [place] [address]   Salicylate Lvl 08/67/6195 <7.0 (L)  7.0 - 30.0 mg/dL Final   Performed at [place] [address], [place], [place] [address]   Acetaminophen (Tylenol), Serum 04/07/2022 <10 (L)  10 - 30 ug/mL Final   Comment: (NOTE) Therapeutic concentrations vary significantly. A range of 10-30 ug/mL  may be an effective concentration for many patients. However, some  are best treated at concentrations outside of this range. Acetaminophen concentrations >150 ug/mL at 4 hours after ingestion  and >50 ug/mL at 12 hours after ingestion are often associated with  toxic reactions.  Performed at [facility], [place] [address] [place], [place] [address]    Alcohol, Ethyl (B) 04/07/2022 309 (HH)  <10 mg/dL Final   Comment: CRITICAL RESULT CALLED TO, READ BACK BY AND VERIFIED WITH: RN B WILLIS AT 1858 04/07/22 CRUICKSHANK A (NOTE) Lowest detectable limit for serum alcohol is 10 mg/dL.  For medical purposes only. Performed at [facility], [place] [address] [place], [place] [address]    WBC 04/07/2022 6.7  4.0 - 10.5 K/uL Final   RBC 04/07/2022 5.16 (H)  3.87 - 5.11 MIL/uL Final   Hemoglobin 04/07/2022 16.7 (H)  12.0 - 15.0 g/dL Final   HCT 04/07/2022 47.6 (H)  36.0 - 46.0 % Final   MCV 04/07/2022 92.2  80.0 - 100.0 fL Final   MCH 04/07/2022 32.4  26.0 - 34.0 pg Final   MCHC 04/07/2022 35.1  30.0 - 36.0 g/dL Final   RDW 04/07/2022 14.6  11.5 - 15.5 % Final   Platelets 04/07/2022 121 (L)  150 - 400 K/uL Final   Comment: Immature Platelet Fraction may be clinically indicated, consider ordering this additional test [id number]    nRBC 04/07/2022 0.0  0.0 - 0.2 % Final   Neutrophils Relative % 04/07/2022 56  % Final   Neutro Abs 04/07/2022  3.7  1.7 - 7.7 K/uL Final   Lymphocytes Relative 04/07/2022 38  % Final   Lymphs Abs 04/07/2022 2.5  0.7 - 4.0  K/uL Final   Monocytes Relative 04/07/2022 6  % Final   Monocytes Absolute 04/07/2022 0.4  0.1 - 1.0 K/uL Final   Eosinophils Relative 04/07/2022 0  % Final   Eosinophils Absolute 04/07/2022 0.0  0.0 - 0.5 K/uL Final   Basophils Relative 04/07/2022 0  % Final   Basophils Absolute 04/07/2022 0.0  0.0 - 0.1 K/uL Final   Immature Granulocytes 04/07/2022 0  % Final   Abs Immature Granulocytes 04/07/2022 0.02  0.00 - 0.07 K/uL Final   Performed at [facility], [place] [address], [place], [place] [address]   Glucose-Capillary 04/08/2022 220 (H)  70 - 99 mg/dL Final   Glucose reference range applies only to samples taken after fasting for at least 8 hours.    Allergies: Patient has no known allergies.  PTA Medications: (Not in a hospital admission)   Long Term Goals: Improvement in symptoms so as ready for discharge  Short Term Goals: Patient will verbalize feelings in meetings with treatment team members., Patient will attend at least of 50% of the groups daily., and Pt will complete the PHQ9 on admission, day 3 and discharge.  Medical Decision Making  Recommend admission to facility based crisis -Initiated CIWA protocol -We will restart home medications where appropriate    Recommendations  Based on my evaluation the patient does not appear to have an emergency medical condition.  Derrill Center, NP 05/15/22  11:57 AM

## 2022-05-15 NOTE — ED Notes (Signed)
Pt admitted to [facility] voluntarily as a [place]. Patient presents with anxious mood, affect congruent. [name] reports she has long standing issues with alcohol and reports she has been '' drinking daily, a least a bottle of wine a day maybe more for months. Then I will stop for two months and something will trigger me and I'll go back to drinking heavily again. This time it was that my partner left me of 13 years and my son's wife just left him. It really bothered me that he was left, and I think my partner was having an affair and I had been doing good but just keep going back to the alcohol. I'm a  mess, I can't stop shaking, I feel so nauseated and just am tired of this. '' Patient pleasant and cooperative. CIWA completed and prn medications given with good effect. Patient oriented to unit. Compliant with blood work and covid, however states she is unable to produce urine at this time. Awaiting sample. Pt is safe, denies any SI or HI and states she has never attempted suicide. Reports she has an outpatient therapist [name] and sees Psychiatrist Dr. Toy Coleman. Pt given fluids, food. Pt is safe, will con' t to monitor.

## 2022-05-16 ENCOUNTER — Inpatient Hospital Stay (HOSPITAL_COMMUNITY)
Admission: AD | Admit: 2022-05-16 | Discharge: 2022-05-26 | DRG: 897 | Disposition: A | Discharge status: Home / Self Care | Payer: Medicare HMO | Source: Intra-hospital | Attending: Psychiatry | Admitting: Psychiatry

## 2022-05-16 ENCOUNTER — Encounter (HOSPITAL_COMMUNITY): Payer: Self-pay

## 2022-05-16 ENCOUNTER — Other Ambulatory Visit: Payer: Self-pay

## 2022-05-16 ENCOUNTER — Encounter (HOSPITAL_COMMUNITY): Payer: Self-pay | Admitting: Psychiatry

## 2022-05-16 DIAGNOSIS — K219 Gastro-esophageal reflux disease without esophagitis: Secondary | ICD-10-CM | POA: Diagnosis present

## 2022-05-16 DIAGNOSIS — E785 Hyperlipidemia, unspecified: Secondary | ICD-10-CM | POA: Diagnosis present

## 2022-05-16 DIAGNOSIS — F1994 Other psychoactive substance use, unspecified with psychoactive substance-induced mood disorder: Secondary | ICD-10-CM | POA: Diagnosis not present

## 2022-05-16 DIAGNOSIS — F05 Delirium due to known physiological condition: Secondary | ICD-10-CM | POA: Diagnosis not present

## 2022-05-16 DIAGNOSIS — Z602 Problems related to living alone: Secondary | ICD-10-CM | POA: Diagnosis present

## 2022-05-16 DIAGNOSIS — R45851 Suicidal ideations: Secondary | ICD-10-CM | POA: Diagnosis present

## 2022-05-16 DIAGNOSIS — F10230 Alcohol dependence with withdrawal, uncomplicated: Secondary | ICD-10-CM | POA: Diagnosis not present

## 2022-05-16 DIAGNOSIS — F0152 Vascular dementia, unspecified severity, with psychotic disturbance: Secondary | ICD-10-CM | POA: Diagnosis present

## 2022-05-16 DIAGNOSIS — D696 Thrombocytopenia, unspecified: Secondary | ICD-10-CM | POA: Diagnosis present

## 2022-05-16 DIAGNOSIS — F1020 Alcohol dependence, uncomplicated: Secondary | ICD-10-CM | POA: Diagnosis present

## 2022-05-16 DIAGNOSIS — K5900 Constipation, unspecified: Secondary | ICD-10-CM | POA: Diagnosis not present

## 2022-05-16 DIAGNOSIS — N390 Urinary tract infection, site not specified: Secondary | ICD-10-CM | POA: Diagnosis present

## 2022-05-16 DIAGNOSIS — Z9079 Acquired absence of other genital organ(s): Secondary | ICD-10-CM

## 2022-05-16 DIAGNOSIS — W19XXXA Unspecified fall, initial encounter: Secondary | ICD-10-CM | POA: Diagnosis not present

## 2022-05-16 DIAGNOSIS — Z87891 Personal history of nicotine dependence: Secondary | ICD-10-CM

## 2022-05-16 DIAGNOSIS — R296 Repeated falls: Secondary | ICD-10-CM | POA: Diagnosis present

## 2022-05-16 DIAGNOSIS — Z20822 Contact with and (suspected) exposure to covid-19: Secondary | ICD-10-CM | POA: Diagnosis present

## 2022-05-16 DIAGNOSIS — G4700 Insomnia, unspecified: Secondary | ICD-10-CM | POA: Diagnosis present

## 2022-05-16 DIAGNOSIS — E079 Disorder of thyroid, unspecified: Secondary | ICD-10-CM | POA: Diagnosis present

## 2022-05-16 DIAGNOSIS — F411 Generalized anxiety disorder: Secondary | ICD-10-CM | POA: Diagnosis present

## 2022-05-16 DIAGNOSIS — E0789 Other specified disorders of thyroid: Secondary | ICD-10-CM

## 2022-05-16 DIAGNOSIS — F4310 Post-traumatic stress disorder, unspecified: Secondary | ICD-10-CM | POA: Diagnosis present

## 2022-05-16 DIAGNOSIS — R7401 Elevation of levels of liver transaminase levels: Secondary | ICD-10-CM | POA: Diagnosis present

## 2022-05-16 DIAGNOSIS — F332 Major depressive disorder, recurrent severe without psychotic features: Secondary | ICD-10-CM | POA: Diagnosis present

## 2022-05-16 DIAGNOSIS — Z90710 Acquired absence of both cervix and uterus: Secondary | ICD-10-CM

## 2022-05-16 DIAGNOSIS — Z811 Family history of alcohol abuse and dependence: Secondary | ICD-10-CM

## 2022-05-16 DIAGNOSIS — R21 Rash and other nonspecific skin eruption: Secondary | ICD-10-CM | POA: Diagnosis not present

## 2022-05-16 DIAGNOSIS — S0083XA Contusion of other part of head, initial encounter: Secondary | ICD-10-CM | POA: Diagnosis not present

## 2022-05-16 DIAGNOSIS — R945 Abnormal results of liver function studies: Secondary | ICD-10-CM | POA: Diagnosis not present

## 2022-05-16 DIAGNOSIS — Z79899 Other long term (current) drug therapy: Secondary | ICD-10-CM

## 2022-05-16 DIAGNOSIS — Z818 Family history of other mental and behavioral disorders: Secondary | ICD-10-CM

## 2022-05-16 DIAGNOSIS — T148XXA Other injury of unspecified body region, initial encounter: Secondary | ICD-10-CM

## 2022-05-16 MED ORDER — IBUPROFEN 400 MG PO TABS
400.0000 mg | ORAL_TABLET | Freq: Four times a day (QID) | ORAL | Status: DC | PRN
Start: 2022-05-16 — End: 2022-05-24
  Administered 2022-05-17 – 2022-05-22 (×6): 400 mg via ORAL
  Filled 2022-05-16 (×6): qty 1

## 2022-05-16 MED ORDER — LORAZEPAM 1 MG PO TABS
1.0000 mg | ORAL_TABLET | Freq: Four times a day (QID) | ORAL | Status: AC | PRN
Start: 2022-05-16 — End: 2022-05-19
  Administered 2022-05-16 – 2022-05-17 (×2): 1 mg via ORAL
  Filled 2022-05-16 (×2): qty 1

## 2022-05-16 MED ORDER — HYDROXYZINE HCL 25 MG PO TABS
25.0000 mg | ORAL_TABLET | Freq: Three times a day (TID) | ORAL | Status: DC | PRN
  Administered 2022-05-20 – 2022-05-25 (×4): 25 mg via ORAL
  Filled 2022-05-16 (×6): qty 1

## 2022-05-16 MED ORDER — ZIPRASIDONE MESYLATE 20 MG IM SOLR
20.0000 mg | INTRAMUSCULAR | Status: AC | PRN
  Administered 2022-05-16: 20 mg via INTRAMUSCULAR
  Filled 2022-05-16: qty 20

## 2022-05-16 MED ORDER — PANTOPRAZOLE SODIUM 40 MG PO TBEC
40.0000 mg | DELAYED_RELEASE_TABLET | Freq: Every day | ORAL | Status: DC
  Administered 2022-05-16 – 2022-05-26 (×11): 40 mg via ORAL
  Filled 2022-05-16 (×17): qty 1

## 2022-05-16 MED ORDER — ADULT MULTIVITAMIN W/MINERALS CH
1.0000 | ORAL_TABLET | Freq: Every day | ORAL | Status: DC
  Administered 2022-05-17 – 2022-05-26 (×10): 1 via ORAL
  Filled 2022-05-16 (×16): qty 1

## 2022-05-16 MED ORDER — LORAZEPAM 1 MG PO TABS
1.0000 mg | ORAL_TABLET | ORAL | Status: DC
  Administered 2022-05-16: 1 mg via ORAL
  Filled 2022-05-16: qty 1

## 2022-05-16 MED ORDER — THIAMINE HCL 100 MG PO TABS
100.0000 mg | ORAL_TABLET | Freq: Every day | ORAL | Status: DC
  Administered 2022-05-17 – 2022-05-26 (×10): 100 mg via ORAL
  Filled 2022-05-16 (×15): qty 1

## 2022-05-16 MED ORDER — OLANZAPINE 5 MG PO TBDP
5.0000 mg | ORAL_TABLET | Freq: Three times a day (TID) | ORAL | Status: DC | PRN
  Administered 2022-05-16: 5 mg via ORAL
  Filled 2022-05-16: qty 1

## 2022-05-16 MED ORDER — HYDROXYZINE HCL 25 MG PO TABS
25.0000 mg | ORAL_TABLET | Freq: Four times a day (QID) | ORAL | Status: AC | PRN
  Administered 2022-05-16 – 2022-05-17 (×2): 25 mg via ORAL
  Filled 2022-05-16 (×2): qty 1

## 2022-05-16 MED ORDER — MAGNESIUM HYDROXIDE 400 MG/5ML PO SUSP
30.0000 mL | Freq: Every day | ORAL | Status: DC | PRN
  Administered 2022-05-20: 30 mL via ORAL
  Filled 2022-05-16: qty 30

## 2022-05-16 MED ORDER — TRAZODONE HCL 100 MG PO TABS
100.0000 mg | ORAL_TABLET | Freq: Every evening | ORAL | Status: DC | PRN
  Administered 2022-05-16 – 2022-05-25 (×10): 100 mg via ORAL
  Filled 2022-05-16 (×10): qty 1

## 2022-05-16 MED ORDER — LOPERAMIDE HCL 2 MG PO CAPS: 2.0000 mg | ORAL_CAPSULE | ORAL | Status: AC | PRN

## 2022-05-16 MED ORDER — METOPROLOL SUCCINATE ER 50 MG PO TB24
50.0000 mg | ORAL_TABLET | Freq: Every day | ORAL | Status: DC
  Administered 2022-05-16 – 2022-05-25 (×10): 50 mg via ORAL
  Filled 2022-05-16 (×14): qty 1

## 2022-05-16 MED ORDER — AMITRIPTYLINE HCL 25 MG PO TABS
25.0000 mg | ORAL_TABLET | Freq: Every day | ORAL | Status: DC
  Administered 2022-05-16 – 2022-05-21 (×6): 25 mg via ORAL
  Filled 2022-05-16 (×11): qty 1

## 2022-05-16 MED ORDER — LORAZEPAM 2 MG/ML IJ SOLN: 1.0000 mg | Freq: Once | INTRAMUSCULAR | Status: AC

## 2022-05-16 MED ORDER — ONDANSETRON 4 MG PO TBDP
4.0000 mg | ORAL_TABLET | Freq: Four times a day (QID) | ORAL | Status: AC | PRN
Start: 2022-05-16 — End: 2022-05-19

## 2022-05-16 MED ORDER — ATORVASTATIN CALCIUM 40 MG PO TABS
40.0000 mg | ORAL_TABLET | Freq: Every day | ORAL | Status: DC
  Administered 2022-05-16: 40 mg via ORAL
  Filled 2022-05-16 (×4): qty 1

## 2022-05-16 MED ORDER — LORAZEPAM 1 MG PO TABS
1.0000 mg | ORAL_TABLET | ORAL | Status: DC | PRN
  Filled 2022-05-16: qty 1

## 2022-05-16 MED ORDER — TRAZODONE HCL 50 MG PO TABS
25.0000 mg | ORAL_TABLET | Freq: Once | ORAL | Status: AC
  Administered 2022-05-16: 25 mg via ORAL

## 2022-05-16 MED ORDER — SUMATRIPTAN SUCCINATE 50 MG PO TABS
50.0000 mg | ORAL_TABLET | ORAL | Status: DC | PRN
  Administered 2022-05-24 – 2022-05-25 (×4): 50 mg via ORAL
  Filled 2022-05-16 (×4): qty 1

## 2022-05-16 MED ORDER — THIAMINE HCL 100 MG/ML IJ SOLN: 100.0000 mg | Freq: Once | INTRAMUSCULAR | Status: DC

## 2022-05-16 MED ORDER — ALUM & MAG HYDROXIDE-SIMETH 200-200-20 MG/5ML PO SUSP: 30.0000 mL | ORAL | Status: DC | PRN

## 2022-05-16 MED ORDER — LORAZEPAM 1 MG PO TABS: 1.0000 mg | ORAL_TABLET | Freq: Two times a day (BID) | ORAL | Status: DC

## 2022-05-16 MED ORDER — LORAZEPAM 1 MG PO TABS: 1.0000 mg | ORAL_TABLET | Freq: Every day | ORAL | Status: DC

## 2022-05-16 MED ORDER — LORAZEPAM 2 MG/ML IJ SOLN
INTRAMUSCULAR | Status: AC
  Filled 2022-05-16: qty 1

## 2022-05-16 MED ORDER — LORAZEPAM 1 MG PO TABS: 1.0000 mg | ORAL_TABLET | Freq: Every day | ORAL | 0 refills | Status: DC

## 2022-05-16 NOTE — ED Notes (Addendum)
Pt up to nurse's station stating, "is the press going to come?" Nursing staff asked pt what she is referring to and pt states, "someone died across the hall from me didn't they? I heard people banging on the walls and people started coming down the hall." Nursing staff explained to pt that these events have not happened. Pt states, "I've must have been dreaming." Pt has not been asleep during the 15 minute checks for the majority of the night. Pt appears confused and anxious. Leandro Reasoner, NP notified. Requested NP to come assess pt. Nursing staff obtaining vital signs. Will continue to monitor for safety.

## 2022-05-16 NOTE — ED Provider Notes (Signed)
FBC/OBS ASAP Discharge Summary  Date and Time: 05/16/2022 1:23 PM  Name: [name]  MRN:  [id number]   Discharge Diagnoses:  Final diagnoses:  Alcohol abuse  Substance induced mood disorder ([place])  Alcohol withdrawal syndrome without complication [facility])   Patient interviewed in conjunction with Coleman student [name]. Original note by Coleman student [name] edited by me as appropriate for completeness and accuracy.    Subjective:  Patient interviewed in conjunction with LCSW and Coleman student [name].  [name] is a 61 year old female with reported anxiety, depression, PTSD, AUD history of self that presented to [facility] urgent care seeking substance abuse resources for alcohol and depression.  She was accompanied by family and friends and stated that she has been drinking on and off for most of her life.   Patient seen and chart reviewed. Most recent CIWA 5. Patient with some bizarre and paranoid behavior last night with concerns for hallucinations. Patient denies hallucinations and states that she has been "overthinking" her relationship with her partner.   On evaluation today, [name] had good eye contact, was conversational and had a pleasant affect with normal tone and pace. [name] said that she feels "good" and she was here to "detox" because "I drink too much". She attributes the recent end of a 13-year relationship to her alcohol use. She previously stated that she had been drinking 1-2 bottles of wine per day, and that she had a recent period of sobriety that lasted 3 months. Patient describes experiencing multiple falls recently and shows multiple bruises on both arms to treatment team. No pain reported.. She denied current suicidal ideations, thoughts of self-harm, or thoughts of hurting others, denied firearms in the home, denied current auditory or visual hallucinations but did state that she has only had one visual hallucination which was two weeks ago and  involved her son's legs "shriveling up". She stated her mood today was 8/10 (10 being the best), that she does not have an appetite and did not sleep well because she is in an unfamiliar place and that she has been ruminating about the end of her relationship. She did refer to her partner as "[name]", "[name]", and "[name]" at different times during our conversation. She was given trazodone last night for sleep, and stated that she has been using Ambien and tylenol PM to sleep at home. We discussed staying inpatient until she has completed the ativan taper to monitor for serious complications of alcohol withdrawal, but [name] said that she would prefer to leave so that "I can get some sleep", that "she had done this before" and was familiar with withdrawal treatment, and that her sons would be willing to take care of her at her home. She has participated in substance abuse programs in the past but said that she "didn't like" them but would be willing to participate in an exclusively female program. Patient states that she normally takes [place] for sleep and will occasionally take more than one. She did complain of pain due to headache and degenerative disc disease and asked for percocet. Discussed the concerns for narcotic pain medications with addiction as well as her h/o recent falls. Patient verbalizes understanding and is amenable to prn ibuprofen at this time. Discussed starting gabapentin; however, patient declines at this time reporting that she had taken this in the past and she felt drowsy.    Per chart review, patient presented to ED last month in a similar manner and displayed bizarre behavior but ultimately  was discharged once she reported feel better r/t  etoh withdrawal sx   Patient provides consent for collateral to contact sons [name] and [name] in order to discuss any possible safety concerns and patient was amenable. She states that [name] lives in [place] but is currently in [place] and that  [name] drove in from [place] and is currently staying in [place].   collateral [name] [phone]) Called at ~10:09 am on 05/16/22. [name] was present throughout phone call.  Sons were informed of the patient's desire to discharge back home.  [name] and [name] both report safety concerns.  They report that "she needs long-term help with alcohol abuse" . Sons go on to state that often she will present to the hospital in order to get help and then request discharge.  They report that [name] currently lives alone and they have concerns about her if she were to return home at this time.  They report that she has recently been experiencing hallucinations and acting out of character.  They report that she has been banging on neighbors doors and has been paranoid about people in the neighborhood calling the police on her.  They report that [name] has been verbally aggressive and breaking things and that she is "threatening suicide constantly".  They report that they have attempted to find treatment for [facility] but that they have had difficulty finding inpatient treatment and that will take Medicare.  [name] shares that there is high concern that patient's reported conflict with her partner "is fake".  They state that [name]'s partner has  denied being unfaithful and that she is concerned about her alcohol use. Sons report that people in the neighborhood have also noticed a change in Manvi's behavior and possible hallucinations (see above).  They report that [name] has had bizarre things recently such as that [name] is currently in jail and that he transports "through astral projections.".  They both expressed concern that [name] has had difficulties with her mental health and alcohol abuse.  They report that she has been abusing alcohol is "for decades".  They report that recently she has been falling more frequently.  They state that she has been "on a bender for a month and a half" and that she went to the hospital last month but  that she was discharged.  They report that an IVC will likely be filed if she is discharged at this time due to safety concerns, hallucinations and paranoia.   Discussed with sons that due to patient's behavior yesterday as well as collateral information provided that currently she will be recommended for higher level of care.  [name] and [name] are in agreement with this recommendation. Sons report that they would like to speak to [name] on the phone in order to convince her to stay. Sons report that she was IVC'd in the past and that it was a very negative experience.    Patient reassessed after collateral and after she spoke with her sons Discussed with patient that her sons currently have safety concerns and there has been concern for possible hallucinations and change in behavior. Patient adamantly denies this but is agreeable for inpatient hospitalization at this time.  Stay Summary:  61 yo female with a h/o reported anxiety, depression, PTSD, AUD who presented to the [facility] on 05/15/2022 seeking alcohol detox. Patient was admitted to the [facility] for detox and started on an ativan taper. Klonopin was held in order for patient to undergo alcohol detox--alcohol detox protocol with ativan to assist  with discontinuation of klonopin. Overnight, patient presented bizarre and paranoid. See above for additional information from interview day of discharge. Patient denied SI/HI/AVH/paranoa; patient denied feeling paranod and requested discharge; however, patient was amenable to staying and for higher level of care once discussed with sons. She has been accepted to [facility] for higher level of care    Total Time spent with patient: 1 hour  Past Psychiatric History: AUD, MDD, PTSD Past Medical History:  Past Medical History:  Diagnosis Date   Alcohol dependence ([place])    recent IVC at [facility] 10/ 2015 by son--  11-07-2014 pt states no alcohol since 09-11-2014 (approx)   Anxiety    Frequency of urination     GERD (gastroesophageal reflux disease)    Headache(784.0)    Hypertension    no meds per pt,  states bp been ok    Major depression    PTSD (post-traumatic stress disorder)    Renal calculus, right    Right ureteral stone    Urgency of urination     Past Surgical History:  Procedure Laterality Date   BILATERAL SALPINGECTOMY Bilateral 03/19/2014   Procedure: BILATERAL SALPINGECTOMY;  Surgeon: Lovenia Kim, MD;  Location: [facility];  Service: Gynecology;  Laterality: Bilateral;   CESAREAN SECTION  1991   HOLMIUM LASER APPLICATION Right 49/06/262   Procedure: HOLMIUM LASER APPLICATION;  Surgeon: Alexis Frock, MD;  Location: [facility];  Service: Urology;  Laterality: Right;   ROBOTIC ASSISTED TOTAL HYSTERECTOMY N/A 03/19/2014   Procedure: ROBOTIC ASSISTED TOTAL HYSTERECTOMY;  Surgeon: Lovenia Kim, MD;  Location: [facility];  Service: Gynecology;  Laterality: N/A;   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 Denies history of attempted or completed suicides  Social History:  Social History   Substance and Sexual Activity  Alcohol Use Yes   Alcohol/week: 20.0 standard drinks of alcohol   Types: 10 Glasses of wine, 10 Shots of liquor per week   Comment: last alcohol 55 days ago (approx. 09-11-2014)     Social History   Substance and Sexual Activity  Drug Use Yes   Types: Marijuana, Benzodiazepines   Comment: last marjuana 2013/  ambien dependence    Social History   Socioeconomic History   Marital status: Divorced    Spouse name: Not on file   Number of children: Not on file    Years of education: Not on file   Highest education level: Not on file  Occupational History   Not on file  Tobacco Use   Smoking status: Former    Types: Cigarettes    Quit date: 09/11/2014    Years since quitting: 7.6   Smokeless tobacco: Never  Substance and Sexual Activity   Alcohol use: Yes    Alcohol/week: 20.0 standard drinks of alcohol    Types: 10 Glasses of wine, 10 Shots of liquor per week    Comment: last alcohol 55 days ago (approx. 09-11-2014)   Drug use: Yes    Types: Marijuana, Benzodiazepines    Comment: last marjuana 2013/  ambien dependence   Sexual activity: Not on file  Other Topics Concern   Not on file  Social History Narrative   Not on file   Social Determinants of Health   Financial Resource Strain: Not on file  Food Insecurity: Not on file  Transportation Needs: Not on file  Physical Activity: Not on file  Stress: Not on file  Social Connections: Not on file   SDOH:  SDOH Screenings   Alcohol Screen: Not on file  Depression (PHQ2-9): Medium Risk (05/15/2022)   Depression (PHQ2-9)    PHQ-2 Score: 18  Financial Resource Strain: Not on file  Food Insecurity: Not on file  Housing: Not on file  Physical Activity: Not on file  Social Connections: Not on file  Stress: Not on file  Tobacco Use: Medium Risk (12/14/2021)   Patient History    Smoking Tobacco Use: Former    Smokeless Tobacco Use: Never    Passive Exposure: Not on file  Transportation Needs: Not on file    Tobacco Cessation:  Prescription not provided because: denied use  Current Medications:  Current Facility-Administered Medications  Medication Dose Route Frequency Provider Last Rate Last Admin   acetaminophen (TYLENOL) tablet 650 mg  650 mg Oral Q6H PRN Derrill Center, NP   650 mg at 05/16/22 0201   alum & mag hydroxide-simeth (MAALOX/MYLANTA) 200-200-20 MG/5ML suspension 30 mL  30 mL Oral Q4H PRN Derrill Center, NP       atorvastatin (LIPITOR) tablet 40 mg  40 mg Oral  QHS Derrill Center, NP   40 mg at 05/15/22 2114   feeding supplement (ENSURE ENLIVE / ENSURE PLUS) liquid 237 mL  237 mL Oral BID BM Derrill Center, NP   237 mL at 05/15/22 1910   hydrOXYzine (ATARAX) tablet 25 mg  25 mg Oral Q6H PRN Derrill Center, NP   25 mg at 05/15/22 1249   loperamide (IMODIUM) capsule 2-4 mg  2-4 mg Oral PRN Derrill Center, NP       LORazepam (ATIVAN) tablet 1 mg  1 mg Oral Q6H PRN Derrill Center, NP   1 mg at 05/16/22 0157   LORazepam (ATIVAN) tablet 1 mg  1 mg Oral TID Derrill Center, NP       Followed by   Derrill Memo ON 05/17/2022] LORazepam (ATIVAN) tablet 1 mg  1 mg Oral BID Derrill Center, NP       Followed by   Derrill Memo ON 05/19/2022] LORazepam (ATIVAN) tablet 1 mg  1 mg Oral Daily Derrill Center, NP       magnesium hydroxide (MILK OF MAGNESIA) suspension 30 mL  30 mL Oral Daily PRN Derrill Center, NP       multivitamin with minerals tablet 1 tablet  1 tablet Oral Daily Derrill Center, NP   1 tablet at 05/16/22 1009   ondansetron (ZOFRAN-ODT) disintegrating tablet 4 mg  4 mg Oral Q6H PRN Derrill Center, NP       thiamine tablet 100 mg  100 mg Oral Daily Derrill Center, NP   100 mg at 05/16/22 1010   traZODone (DESYREL) tablet 50 mg  50 mg Oral QHS PRN Derrill Center, NP   50 mg at 05/15/22 2114   Current Outpatient Medications  Medication Sig Dispense Refill   amitriptyline (ELAVIL) 25 MG tablet Take 50 mg by mouth at bedtime.     metoprolol succinate (TOPROL-XL) 50 MG 24 hr tablet Take 50 mg by mouth at bedtime.     atorvastatin (  LIPITOR) 40 MG tablet Take 40 mg by mouth at bedtime.     esomeprazole (NEXIUM) 20 MG capsule Take 20 mg by mouth daily as needed (acid reflux/indigestion).     estradiol (VIVELLE-DOT) 0.0375 MG/24HR Place 1 patch onto the skin 2 (two) times a week. Tuesday and Thursday     [START ON 05/19/2022] LORazepam (ATIVAN) 1 MG tablet Take 1 tablet (1 mg total) by mouth daily. Continue taper 6/12 take 1 mg at 1600 and 2200 6/13 take 1 mg at  1000 and 2200 6/14 take 1 mg at 1000 6/15 take 1 mg at 1000 30 tablet 0   rizatriptan (MAXALT) 5 MG tablet Take 10 mg by mouth See admin instructions. Take 2 tablets (10 mg) by mouth at onset of migraine, may repeat in 2 hours if still needed      PTA Medications: (Not in a hospital admission)      05/15/2022    1:39 PM  Depression screen PHQ 2/9  Decreased Interest 2  Down, Depressed, Hopeless 2  PHQ - 2 Score 4  Altered sleeping 3  Tired, decreased energy 2  Change in appetite 0  Feeling bad or failure about yourself  2  Trouble concentrating 2  Moving slowly or fidgety/restless 2  Suicidal thoughts 3  PHQ-9 Score 18  Difficult doing work/chores Somewhat difficult    Flowsheet Row ED from 05/15/20[facility] in [facility] ED from 04/07/2022 in [facility] ED from 10/07/2021 in [facility]  C-SSRS RISK CATEGORY No Risk Low Risk No Risk       Musculoskeletal  Strength & Muscle Tone: within normal limits Gait & Station: normal Patient leans: N/A  Psychiatric Specialty Exam  Presentation  General Appearance: Appropriate for Environment; Casual  Eye Contact:Fair  Speech:Clear and Coherent; Normal Rate  Speech Volume:Normal  Handedness:Right   Mood and Affect  Mood:-- ("good")  Affect:Full Range; Labile   Thought Process  Thought Processes:-- (circumstantial)  Descriptions of Associations:Circumstantial  Orientation:Full (Time, Place and Person)  Thought Content:Perseveration  Diagnosis of Schizophrenia or Schizoaffective disorder in past: No    Hallucinations:Hallucinations: None  Ideas of Reference:None  Suicidal Thoughts:Suicidal Thoughts: No SI Passive Intent and/or Plan: Without Intent  Homicidal Thoughts:Homicidal Thoughts: No   Sensorium  Memory:Immediate Fair; Recent Fair; Remote Poor  Judgment:Poor  Insight:Poor   Executive Functions   Concentration:Fair  Attention Span:Fair  [place]  Language:Good   Psychomotor Activity  Psychomotor Activity:Psychomotor Activity: Tremor (tremor in BUE with outstretched hands)   Assets  Assets:Communication Skills; Desire for Improvement; Social Support; Resilience; Housing   Sleep  Sleep:Sleep: Poor   Nutritional Assessment (For OBS and FBC admissions only) Has the patient had a weight loss or gain of 10 pounds or more in the last 3 months?: No Has the patient had a decrease in food intake/or appetite?: No Does the patient have dental problems?: No Does the patient have eating habits or behaviors that may be indicators of an eating disorder including binging or inducing vomiting?: No Has the patient recently lost weight without trying?: 0 Has the patient been eating poorly because of a decreased appetite?: 0 Malnutrition Screening Tool Score: 0    Physical Exam  Physical Exam Constitutional:      Appearance: Normal appearance. She is normal weight.  HENT:     Head: Normocephalic and atraumatic.  Eyes:     Extraocular Movements: Extraocular movements intact.     Conjunctiva/sclera:  Conjunctivae normal.  Cardiovascular:     Rate and Rhythm: Normal rate and regular rhythm.  Pulmonary:     Effort: Pulmonary effort is normal.     Breath sounds: Normal breath sounds.  Abdominal:     General: Abdomen is flat.     Palpations: Abdomen is soft.  Neurological:     Mental Status: She is alert and oriented to person, place, and time.     Comments: Tremors in BUE with outstretched arms     Review of Systems  Constitutional:  Negative for chills and fever.  HENT:  Negative for hearing loss.   Eyes:  Negative for discharge and redness.  Respiratory:  Negative for cough.   Cardiovascular:  Negative for chest pain.  Gastrointestinal:  Negative for abdominal pain.  Musculoskeletal:  Negative for myalgias.  Neurological:  Positive for  tremors (tremors in hands). Negative for headaches.  Psychiatric/Behavioral:  Positive for substance abuse. Negative for depression, hallucinations and suicidal ideas. The patient has insomnia.    Blood pressure (!) 140/91, pulse 94, temperature 98.4 F (36.9 C), resp. rate 20, last menstrual period 02/09/2014, SpO2 97 %. There is no height or weight on file to calculate BMI.  Demographic Factors:  Divorced or widowed, Caucasian, Abner Greenspan, lesbian, or bisexual orientation, Low socioeconomic status, Living alone, and Unemployed  Loss Factors: Loss of significant relationship and Decline in physical health  Historical Factors: Family history of mental illness or substance abuse and Impulsivity  Risk Reduction Factors:   Sense of responsibility to family  Continued Clinical Symptoms:  Depression:   Aggression Comorbid alcohol abuse/dependence Delusional Alcohol/Substance Abuse/Dependencies More than one psychiatric diagnosis Previous Psychiatric Diagnoses and Treatments Medical Diagnoses and Treatments/Surgeries  Cognitive Features That Contribute To Risk:  Polarized thinking    Suicide Risk:  Mild:  Suicidal ideation of limited frequency, intensity, duration, and specificity.  There are no identifiable plans, no associated intent, mild dysphoria and related symptoms, good self-control (both objective and subjective assessment), few other risk factors, and identifiable protective factors, including available and accessible social support.  Plan Of Care/Follow-up recommendations:  Patient has been accepted to [facility] for a higher level of care.   Unclear if patient's bizarre behavior/paranoia is related to alcohol withdrawal.  Prior to hospitalization, collateral reports that she has been acting very bizarrely and possibly hallucinating.  However, patient also reports that she has been taking Ambien more than prescribed which could also be contributing to  symptoms of  hallucinations.  At this time, patient is appropriate for higher level of care due to symptoms of paranoia/possible hallucinations  that have been displayed while at the [facility].   Per PMP, patient has been receiving narcotics, benzodiazepines, and ambien from outpatient providers with her ongoing substance abuse. Patient and collateral from sons reveals that patient has been experiencing more frequent falls. Patient would benefit from substance use treatment as well as tapering/minimizing controlled substances upon discharge-however, will defer this to outpatient providers.  Disposition: [facility] for inpatient treatment-needs higher level of care  Ival Bible, MD 05/16/2022, 1:23 PM

## 2022-05-16 NOTE — ED Notes (Signed)
Report was called to nurse [facility] earlier and [facility] arrived to take her to [facility]. The appropriate paperwork was transported with her. She received her personal belongings and left the facility at 1608hrs.

## 2022-05-16 NOTE — Progress Notes (Signed)
   05/16/22 2000  Psych Admission Type (Psych Patients Only)  Admission Status Voluntary  Psychosocial Assessment  Patient Complaints Substance abuse  Eye Contact Fair  Facial Expression Anxious  Affect Anxious;Preoccupied;Labile  Speech Pressured;Rapid  Interaction Intrusive  Motor Activity Restless;Fidgety  Appearance/Hygiene Disheveled  Behavior Characteristics Anxious;Intrusive;Restless;Fidgety  Mood Anxious;Suspicious;Labile;Preoccupied  Aggressive Behavior  Effect No apparent injury  Thought Process  Coherency Circumstantial  Content WDL  Delusions WDL  Perception WDL  Hallucination None reported or observed  Judgment Impaired  Confusion Mild  Danger to Self  Current suicidal ideation? Denies  Danger to Others  Danger to Others None reported or observed

## 2022-05-16 NOTE — ED Notes (Signed)
  [name] remains  visible on the unit, pleasant, but disorganized thoughts. She is restless at intervals and impatient waiting to be transferred.

## 2022-05-16 NOTE — Progress Notes (Cosign Needed)
Behavioral Health Progress Note  Date and Time: 05/16/2022 10:52 AM Name: [name] MRN:  [id number]  Subjective:   [name] is a 61 year old female that presented to [facility] urgent care seeking substance abuse resources for alcohol and depression.  She was accompanied by family and friends and stated that she has been drinking on and off for most of her life.  On evaluation today, [name] had good eye contact, was conversational and had a pleasant affect with normal tone and pace. [name] said that she feels "good" and she was here to "detox" because she drinks too much. She attributes the recent end of a 13-year relationship to her alcohol use. She previously stated that she had been drinking 1-2 bottles of wine per day, and that she had a recent period of sobriety that lasted 3 months. She showed [place] multiple bruises on both arms which she said were from falls. She denied current suicidal ideations, thoughts of self-harm, or thoughts of hurting others, denied firearms in the home, denied current auditory or visual hallucinations but did state that she has only had one visual hallucination which was two weeks ago and involved her son's legs "shriveling up". She stated her mood today was 8/10, that she does not have an appetite and did not sleep well because she is in an unfamiliar place and that she has been ruminating about the end of her relationship. She did refer to her partner as "[name]", "[name]", and "[name]" at different times during our conversation. She was given trazodone last night for sleep, and stated that she has been using Ambien and tylenol PM to sleep at home. We discussed staying inpatient until she has completed the ativan taper to monitor for serious complications of alcohol withdrawal, but Saoirse said that she would prefer to leave so that "I can get some sleep", that "she had done this before" and was familiar with withdrawal treatment, and that her sons would be willing to take  care of her at her home. She has participated in substance abuse programs in the past but said that she "didn't like" them but would be willing to participate in an exclusively female program. She did complain of pain due to headache and degenerative disc disease and asked for percocet; acetaminophen and ibuprofen were offered instead.  Sons [name] and Lysbeth Galas were contacted via phone regarding discharge AMA. Both sons were very concerned that [name] has had multiple past treatment failures following acute inpatient hospitalizations for detoxification, has "decades" of excessive alcohol use, has declining cognitive ability, and has had frequent recent hallucinations and paranoia. They stated that she has had multiple falls recently, that there is broken glass all over the house from falls or from aggressive outbursts associated with alcohol use. They stated that there have been multiple instances of hallucinations and paranoia witnessed by themselves and neighbors that have included her yelling in the street, pounding on neighbors' doors, or becoming physically aggressive. They stated that she often threatened suicide while intoxicated, has had memory gaps, and has driven while intoxicated on several occasions. Neither son lives near her and they were concerned that she would not have the accountability to remain sober without supervision, and that she was dangerous to herself and to others as a result of her alcohol intake. Both were adamant that [facility] needed additional inpatient treatment and did not support her discharging AMA, and also offered to come to the hospital to discuss with her in person.  We discussed [name]'s sons'  recommendations against discharge with her, she was resistant to remaining and requested to call them.   Son [name] [phone]).  Diagnosis:  Final diagnoses:  Alcohol abuse  Substance induced mood disorder ([place])    Total Time spent with patient: 30 minutes  Past Psychiatric  History:  Past Medical History:  Past Medical History:  Diagnosis Date   Alcohol dependence ([place])    recent IVC at [facility] 10/ 2015 by son--  11-07-2014 pt states no alcohol since 09-11-2014 (approx)   Anxiety    Frequency of urination    GERD (gastroesophageal reflux disease)    Headache(784.0)    Hypertension    no meds per pt,  states bp been ok    Major depression    PTSD (post-traumatic stress disorder)    Renal calculus, right    Right ureteral stone    Urgency of urination     Past Surgical History:  Procedure Laterality Date   BILATERAL SALPINGECTOMY Bilateral 03/19/2014   Procedure: BILATERAL SALPINGECTOMY;  Surgeon: Lovenia Kim, MD;  Location: [facility];  Service: Gynecology;  Laterality: Bilateral;   CESAREAN SECTION  1991   HOLMIUM LASER APPLICATION Right 53/05/6439   Procedure: HOLMIUM LASER APPLICATION;  Surgeon: Alexis Frock, MD;  Location: [facility];  Service: Urology;  Laterality: Right;   ROBOTIC ASSISTED TOTAL HYSTERECTOMY N/A 03/19/2014   Procedure: ROBOTIC ASSISTED TOTAL HYSTERECTOMY;  Surgeon: Lovenia Kim, MD;  Location: [facility];  Service: Gynecology;  Laterality: N/A;   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 09-11-2014)     Social History   Substance and Sexual Activity  Drug Use Yes   Types: Marijuana, Benzodiazepines   Comment: last marjuana 2013/  ambien dependence    Social History   Socioeconomic History   Marital status: Divorced    Spouse name: Not on file    Number of children: Not on file   Years of education: Not on file   Highest education level: Not on file  Occupational History   Not on file  Tobacco Use   Smoking status: Former    Types: Cigarettes    Quit date: 09/11/2014    Years since quitting: 7.6   Smokeless tobacco: Never  Substance and Sexual Activity   Alcohol use: Yes    Alcohol/week: 20.0 standard drinks of alcohol    Types: 10 Glasses of wine, 10 Shots of liquor per week    Comment: last alcohol 55 days ago (approx. 09-11-2014)   Drug use: Yes    Types: Marijuana, Benzodiazepines    Comment: last marjuana 2013/  ambien dependence   Sexual activity: Not on file  Other Topics Concern   Not on file  Social History Narrative   Not on file   Social Determinants of Health   Financial Resource Strain: Not on file  Food Insecurity: Not on file  Transportation Needs: Not on file  Physical Activity: Not on file  Stress: Not on file  Social Connections: Not on file  SDOH:  SDOH Screenings   Alcohol Screen: Not on file  Depression (PHQ2-9): Not on file  Financial Resource Strain: Not on file  Food Insecurity: Not on file  Housing: Not on file  Physical Activity: Not on file  Social Connections: Not on file  Stress: Not on file  Tobacco Use: Medium Risk (12/14/2021)   Patient History    Smoking Tobacco Use: Former    Smokeless Tobacco Use: Never    Passive Exposure: Not on file  Transportation Needs: Not on file   Additional Social History:                         Sleep: Poor  Appetite:  Poor  Current Medications:  Current Facility-Administered Medications  Medication Dose Route Frequency Provider Last Rate Last Admin   acetaminophen (TYLENOL) tablet 650 mg  650 mg Oral Q6H PRN Derrill Center, NP   650 mg at 05/16/22 0201   alum & mag hydroxide-simeth (MAALOX/MYLANTA) 200-200-20 MG/5ML suspension 30 mL  30 mL Oral Q4H PRN Derrill Center, NP       atorvastatin (LIPITOR) tablet 40 mg  40 mg  Oral QHS Derrill Center, NP   40 mg at 05/15/22 2114   feeding supplement (ENSURE ENLIVE / ENSURE PLUS) liquid 237 mL  237 mL Oral BID BM Derrill Center, NP   237 mL at 05/15/22 1910   hydrOXYzine (ATARAX) tablet 25 mg  25 mg Oral Q6H PRN Derrill Center, NP   25 mg at 05/15/22 1249   loperamide (IMODIUM) capsule 2-4 mg  2-4 mg Oral PRN Derrill Center, NP       LORazepam (ATIVAN) tablet 1 mg  1 mg Oral Q6H PRN Derrill Center, NP   1 mg at 05/16/22 0157   LORazepam (ATIVAN) tablet 1 mg  1 mg Oral TID Derrill Center, NP       Followed by   Derrill Memo ON 05/17/2022] LORazepam (ATIVAN) tablet 1 mg  1 mg Oral BID Derrill Center, NP       Followed by   Derrill Memo ON 05/19/2022] LORazepam (ATIVAN) tablet 1 mg  1 mg Oral Daily Derrill Center, NP       magnesium hydroxide (MILK OF MAGNESIA) suspension 30 mL  30 mL Oral Daily PRN Derrill Center, NP       multivitamin with minerals tablet 1 tablet  1 tablet Oral Daily Derrill Center, NP   1 tablet at 05/16/22 1009   ondansetron (ZOFRAN-ODT) disintegrating tablet 4 mg  4 mg Oral Q6H PRN Derrill Center, NP       thiamine tablet 100 mg  100 mg Oral Daily Derrill Center, NP   100 mg at 05/16/22 1010   traZODone (DESYREL) tablet 50 mg  50 mg Oral QHS PRN Derrill Center, NP   50 mg at 05/15/22 2114   Current Outpatient Medications  Medication Sig Dispense Refill   amitriptyline (ELAVIL) 25 MG tablet Take 50 mg by mouth at bedtime.     metoprolol succinate (TOPROL-XL) 50 MG 24 hr tablet Take 50 mg by mouth at bedtime.     zolpidem (AMBIEN) 10 MG tablet Take 10 mg by mouth at bedtime.     atorvastatin (LIPITOR) 40 MG tablet Take 40 mg by mouth at bedtime.     clonazePAM (KLONOPIN) 0.5 MG tablet Take 0.5 mg by mouth at bedtime as needed for anxiety.  diphenhydramine-acetaminophen (TYLENOL PM) 25-500 MG TABS tablet Take 1 tablet by mouth at bedtime as needed (sleep (when out of ambien)).     esomeprazole (NEXIUM) 20 MG capsule Take 20 mg by mouth daily as  needed (acid reflux/indigestion).     estradiol (VIVELLE-DOT) 0.0375 MG/24HR Place 1 patch onto the skin 2 (two) times a week. Tuesday and Thursday     oxyCODONE-acetaminophen (ROXICET) 5-325 MG per tablet Take 1-2 tablets by mouth every 6 (six) hours as needed for moderate pain or severe pain. Post-operatively (Patient taking differently: Take 2 tablets by mouth every 6 (six) hours as needed for moderate pain or severe pain.) 30 tablet 0   rizatriptan (MAXALT) 5 MG tablet Take 10 mg by mouth See admin instructions. Take 2 tablets (10 mg) by mouth at onset of migraine, may repeat in 2 hours if still needed      Labs  Lab Results:  Admission on 05/15/2022  Component Date Value Ref Range Status   SARS Coronavirus 2 by RT PCR 05/15/2022 NEGATIVE  NEGATIVE Final   Comment: (NOTE) SARS-CoV-2 target nucleic acids are NOT DETECTED.  The SARS-CoV-2 RNA is generally detectable in upper respiratory specimens during the acute phase of infection. The lowest concentration of SARS-CoV-2 viral copies this assay can detect is 138 copies/mL. A negative result does not preclude SARS-Cov-2 infection and should not be used as the sole basis for treatment or other patient management decisions. A negative result may occur with  improper specimen collection/handling, submission of specimen other than nasopharyngeal swab, presence of viral mutation(s) within the areas targeted by this assay, and inadequate number of viral copies(<138 copies/mL). A negative result must be combined with clinical observations, patient history, and epidemiological information. The expected result is Negative.  Fact Sheet for Patients:  EntrepreneurPulse.com.au  Fact Sheet for Healthcare Providers:  IncredibleEmployment.be  This test is no                          t yet approved or cleared by the [place] FDA and  has been authorized for detection and/or diagnosis of SARS-CoV-2 by FDA  under an Emergency Use Authorization (EUA). This EUA will remain  in effect (meaning this test can be used) for the duration of the COVID-19 declaration under Section 564(b)(1) of the Act, 21 U.S.C.section 360bbb-3(b)(1), unless the authorization is terminated  or revoked sooner.       Influenza A by PCR 05/15/2022 NEGATIVE  NEGATIVE Final   Influenza B by PCR 05/15/2022 NEGATIVE  NEGATIVE Final   Comment: (NOTE) The Xpert Xpress SARS-CoV-2/FLU/RSV plus assay is intended as an aid in the diagnosis of influenza from Nasopharyngeal swab specimens and should not be used as a sole basis for treatment. Nasal washings and aspirates are unacceptable for Xpert Xpress SARS-CoV-2/FLU/RSV testing.  Fact Sheet for Patients: EntrepreneurPulse.com.au  Fact Sheet for Healthcare Providers: IncredibleEmployment.be  This test is not yet approved or cleared by the [place] FDA and has been authorized for detection and/or diagnosis of SARS-CoV-2 by FDA under an Emergency Use Authorization (EUA). This EUA will remain in effect (meaning this test can be used) for the duration of the COVID-19 declaration under Section 564(b)(1) of the Act, 21 U.S.C. section 360bbb-3(b)(1), unless the authorization is terminated or revoked.  Performed at [facility], [place] [address], [place], [place] [address]    WBC 05/15/2022 3.6 (L)  4.0 - 10.5 K/uL Final   RBC 05/15/2022 4.07  3.87 - 5.11 MIL/uL Final   Hemoglobin 05/15/2022 13.7  12.0 - 15.0 g/dL Final   HCT 05/15/2022 40.2  36.0 - 46.0 % Final   MCV 05/15/2022 98.8  80.0 - 100.0 fL Final   MCH 05/15/2022 33.7  26.0 - 34.0 pg Final   MCHC 05/15/2022 34.1  30.0 - 36.0 g/dL Final   RDW 05/15/2022 15.9 (H)  11.5 - 15.5 % Final   Platelets 05/15/2022 63 (L)  150 - 400 K/uL Final   Comment: Immature Platelet Fraction may be clinically indicated, consider ordering this additional test [id number] REPEATED TO  VERIFY PLATELET COUNT CONFIRMED BY SMEAR    nRBC 05/15/2022 0.0  0.0 - 0.2 % Final   Neutrophils Relative % 05/15/2022 61  % Final   Neutro Abs 05/15/2022 2.2  1.7 - 7.7 K/uL Final   Lymphocytes Relative 05/15/2022 30  % Final   Lymphs Abs 05/15/2022 1.1  0.7 - 4.0 K/uL Final   Monocytes Relative 05/15/2022 7  % Final   Monocytes Absolute 05/15/2022 0.3  0.1 - 1.0 K/uL Final   Eosinophils Relative 05/15/2022 2  % Final   Eosinophils Absolute 05/15/2022 0.1  0.0 - 0.5 K/uL Final   Basophils Relative 05/15/2022 0  % Final   Basophils Absolute 05/15/2022 0.0  0.0 - 0.1 K/uL Final   Immature Granulocytes 05/15/2022 0  % Final   Abs Immature Granulocytes 05/15/2022 0.01  0.00 - 0.07 K/uL Final   Performed at [facility], [place] [address], [place], [place] [address]   Sodium 05/15/2022 135  135 - 145 mmol/L Final   Potassium 05/15/2022 3.8  3.5 - 5.1 mmol/L Final   Chloride 05/15/2022 96 (L)  98 - 111 mmol/L Final   CO2 05/15/2022 27  22 - 32 mmol/L Final   Glucose, Bld 05/15/2022 127 (H)  70 - 99 mg/dL Final   Glucose reference range applies only to samples taken after fasting for at least 8 hours.   BUN 05/15/2022 11  6 - 20 mg/dL Final   Creatinine, Ser 05/15/2022 0.72  0.44 - 1.00 mg/dL Final   Calcium 05/15/2022 9.8  8.9 - 10.3 mg/dL Final   Total Protein 05/15/2022 7.0  6.5 - 8.1 g/dL Final   Albumin 05/15/2022 3.9  3.5 - 5.0 g/dL Final   AST 05/15/2022 312 (H)  15 - 41 U/L Final   ALT 05/15/2022 145 (H)  0 - 44 U/L Final   Alkaline Phosphatase 05/15/2022 135 (H)  38 - 126 U/L Final   Total Bilirubin 05/15/2022 2.1 (H)  0.3 - 1.2 mg/dL Final   GFR, Estimated 05/15/2022 >60  >60 mL/min Final   Comment: (NOTE) Calculated using the CKD-EPI Creatinine Equation (2021)    Anion gap 05/15/2022 12  5 - 15 Final   Performed at [facility] [address] [place], [place] [address]   Cholesterol 05/15/2022 217 (H)  0 - 200 mg/dL Final   Triglycerides 05/15/2022 111  <150  mg/dL Final   HDL 05/15/2022 96  >40 mg/dL Final   Total CHOL/HDL Ratio 05/15/2022 2.3  RATIO Final   VLDL 05/15/2022 22  0 - 40 mg/dL Final   LDL Cholesterol 05/15/2022 99  0 - 99 mg/dL Final   Comment:        Total Cholesterol/HDL:CHD Risk Coronary Heart Disease Risk Table                     Men   Women  1/2 Average Risk   3.4   3.3  Average Risk       5.0   4.4  2 X Average Risk   9.6   7.1  3 X Average Risk  23.4   11.0        Use the calculated Patient Ratio above and the CHD Risk Table to determine the patient's CHD Risk.        ATP III CLASSIFICATION (LDL):  <100     mg/dL   Optimal  100-129  mg/dL   Near or Above                    Optimal  130-159  mg/dL   Borderline  160-189  mg/dL   High  >190     mg/dL   Very High Performed at [facility] [address] [place], [place] [address]    TSH 05/15/2022 2.163  0.350 - 4.500 uIU/mL Final   Comment: Performed by a 3rd Generation assay with a functional sensitivity of <=0.01 uIU/mL. Performed at [facility], [place] [address], [place], [place] [address]    Hepatitis B Surface Ag 05/15/2022 NON REACTIVE  NON REACTIVE Final   HCV Ab 05/15/2022 NON REACTIVE  NON REACTIVE Final   Comment: (NOTE) Nonreactive HCV antibody screen is consistent with no HCV infections,  unless recent infection is suspected or other evidence exists to indicate HCV infection.     Hep A IgM 05/15/2022 NON REACTIVE  NON REACTIVE Final   Hep B C IgM 05/15/2022 NON REACTIVE  NON REACTIVE Final   Performed at [facility], [place] [address], [place], [place] [address]   Color, Urine 05/15/2022 YELLOW  YELLOW Final   APPearance 05/15/2022 CLOUDY (A)  CLEAR Final   Specific Gravity, Urine 05/15/2022 1.010  1.005 - 1.030 Final   pH 05/15/2022 5.0  5.0 - 8.0 Final   Glucose, UA 05/15/2022 NEGATIVE  NEGATIVE mg/dL Final   Hgb urine dipstick 05/15/2022 NEGATIVE  NEGATIVE Final   Bilirubin Urine 05/15/2022 NEGATIVE  NEGATIVE Final    Ketones, ur 05/15/2022 NEGATIVE  NEGATIVE mg/dL Final   Protein, ur 05/15/2022 NEGATIVE  NEGATIVE mg/dL Final   Nitrite 05/15/2022 NEGATIVE  NEGATIVE Final   Leukocytes,Ua 05/15/2022 LARGE (A)  NEGATIVE Final   RBC / HPF 05/15/2022 6-10  0 - 5 RBC/hpf Final   WBC, UA 05/15/2022 21-50  0 - 5 WBC/hpf Final   Bacteria, UA 05/15/2022 RARE (A)  NONE SEEN Final   Squamous Epithelial / LPF 05/15/2022 21-50  0 - 5 Final   WBC Clumps 05/15/2022 PRESENT   Final   Mucus 05/15/2022 PRESENT   Final   Budding Yeast 05/15/2022 PRESENT   Final   Non Squamous Epithelial 05/15/2022 0-5 (A)  NONE SEEN Final   Performed at [facility], [place] [address], [place],  [address]   SARSCOV2ONAVIRUS 2 AG 05/15/2022 NEGATIVE  NEGATIVE Final   Comment: (NOTE) SARS-CoV-2 antigen NOT DETECTED.   Negative results are presumptive.  Negative results do not preclude SARS-CoV-2 infection and should not be used as the sole basis for treatment or other patient management decisions, including infection  control decisions, particularly in the presence of clinical signs and  symptoms consistent with COVID-19, or in those who have been in contact with the virus.  Negative results must be combined with clinical observations, patient history, and epidemiological information. The expected result is Negative.  Fact Sheet for  Patients: HandmadeRecipes.com.cy  Fact Sheet for Healthcare Providers: FuneralLife.at  This test is not yet approved or cleared by the [place] FDA and  has been authorized for detection and/or diagnosis of SARS-CoV-2 by FDA under an Emergency Use Authorization (EUA).  This EUA will remain in effect (meaning this test can be used) for the duration of  the COV                          ID-19 declaration under Section 564(b)(1) of the Act, 21 U.S.C. section 360bbb-3(b)(1), unless the authorization is terminated or revoked sooner.    Admission  on 04/07/2022, Discharged on 04/09/2022  Component Date Value Ref Range Status   SARS Coronavirus 2 by RT PCR 04/07/2022 NEGATIVE  NEGATIVE Final   Comment: (NOTE) SARS-CoV-2 target nucleic acids are NOT DETECTED.  The SARS-CoV-2 RNA is generally detectable in upper respiratory specimens during the acute phase of infection. The lowest concentration of SARS-CoV-2 viral copies this assay can detect is 138 copies/mL. A negative result does not preclude SARS-Cov-2 infection and should not be used as the sole basis for treatment or other patient management decisions. A negative result may occur with  improper specimen collection/handling, submission of specimen other than nasopharyngeal swab, presence of viral mutation(s) within the areas targeted by this assay, and inadequate number of viral copies(<138 copies/mL). A negative result must be combined with clinical observations, patient history, and epidemiological information. The expected result is Negative.  Fact Sheet for Patients:  EntrepreneurPulse.com.au  Fact Sheet for Healthcare Providers:  IncredibleEmployment.be  This test is no                          t yet approved or cleared by the [place] FDA and  has been authorized for detection and/or diagnosis of SARS-CoV-2 by FDA under an Emergency Use Authorization (EUA). This EUA will remain  in effect (meaning this test can be used) for the duration of the COVID-19 declaration under Section 564(b)(1) of the Act, 21 U.S.C.section 360bbb-3(b)(1), unless the authorization is terminated  or revoked sooner.       Influenza A by PCR 04/07/2022 NEGATIVE  NEGATIVE Final   Influenza B by PCR 04/07/2022 NEGATIVE  NEGATIVE Final   Comment: (NOTE) The Xpert Xpress SARS-CoV-2/FLU/RSV plus assay is intended as an aid in the diagnosis of influenza from Nasopharyngeal swab specimens and should not be used as a sole basis for treatment. Nasal washings  and aspirates are unacceptable for Xpert Xpress SARS-CoV-2/FLU/RSV testing.  Fact Sheet for Patients: EntrepreneurPulse.com.au  Fact Sheet for Healthcare Providers: IncredibleEmployment.be  This test is not yet approved or cleared by the [place] FDA and has been authorized for detection and/or diagnosis of SARS-CoV-2 by FDA under an Emergency Use Authorization (EUA). This EUA will remain in effect (meaning this test can be used) for the duration of the COVID-19 declaration under Section 564(b)(1) of the Act, 21 U.S.C. section 360bbb-3(b)(1), unless the authorization is terminated or revoked.  Performed at [facility], [place] [address] [place], [place] [address]    Sodium 04/07/2022 137  135 - 145 mmol/L Final   Potassium 04/07/2022 4.2  3.5 - 5.1 mmol/L Final   Chloride 04/07/2022 102  98 - 111 mmol/L Final   CO2 04/07/2022 20 (L)  22 - 32 mmol/L Final   Glucose, Bld 04/07/2022 139 (H)  70 - 99 mg/dL Final   Glucose reference  range applies only to samples taken after fasting for at least 8 hours.   BUN 04/07/2022 19  6 - 20 mg/dL Final   Creatinine, Ser 04/07/2022 0.84  0.44 - 1.00 mg/dL Final   Calcium 04/07/2022 9.5  8.9 - 10.3 mg/dL Final   Total Protein 04/07/2022 7.6  6.5 - 8.1 g/dL Final   Albumin 04/07/2022 4.3  3.5 - 5.0 g/dL Final   AST 04/07/2022 375 (H)  15 - 41 U/L Final   ALT 04/07/2022 176 (H)  0 - 44 U/L Final   Alkaline Phosphatase 04/07/2022 132 (H)  38 - 126 U/L Final   Total Bilirubin 04/07/2022 1.8 (H)  0.3 - 1.2 mg/dL Final   GFR, Estimated 04/07/2022 >60  >60 mL/min Final   Comment: (NOTE) Calculated using the CKD-EPI Creatinine Equation (2021)    Anion gap 04/07/2022 15  5 - 15 Final   Performed at [facility], [place] [address], [place], [place] [address]   Salicylate Lvl 60/45/4098 <7.0 (L)  7.0 - 30.0 mg/dL Final   Performed at [place]  [address] [place], [place] [address]   Acetaminophen (Tylenol), Serum 04/07/2022 <10 (L)  10 - 30 ug/mL Final   Comment: (NOTE) Therapeutic concentrations vary significantly. A range of 10-30 ug/mL  may be an effective concentration for many patients. However, some  are best treated at concentrations outside of this range. Acetaminophen concentrations >150 ug/mL at 4 hours after ingestion  and >50 ug/mL at 12 hours after ingestion are often associated with  toxic reactions.  Performed at [facility], [place] [address], [place], [place] [address]    Alcohol, Ethyl (B) 04/07/2022 309 (HH)  <10 mg/dL Final   Comment: CRITICAL RESULT CALLED TO, READ BACK BY AND VERIFIED WITH: RN B WILLIS AT 1858 04/07/22 CRUICKSHANK A (NOTE) Lowest detectable limit for serum alcohol is 10 mg/dL.  For medical purposes only. Performed at [facility], [place] [address] [place], [place] [address]    WBC 04/07/2022 6.7  4.0 - 10.5 K/uL Final   RBC 04/07/2022 5.16 (H)  3.87 - 5.11 MIL/uL Final   Hemoglobin 04/07/2022 16.7 (H)  12.0 - 15.0 g/dL Final   HCT 04/07/2022 47.6 (H)  36.0 - 46.0 % Final   MCV 04/07/2022 92.2  80.0 - 100.0 fL Final   MCH 04/07/2022 32.4  26.0 - 34.0 pg Final   MCHC 04/07/2022 35.1  30.0 - 36.0 g/dL Final   RDW 04/07/2022 14.6  11.5 - 15.5 % Final   Platelets 04/07/2022 121 (L)  150 - 400 K/uL Final   Comment: Immature Platelet Fraction may be clinically indicated, consider ordering this additional test [id number]    nRBC 04/07/2022 0.0  0.0 - 0.2 % Final   Neutrophils Relative % 04/07/2022 56  % Final   Neutro Abs 04/07/2022 3.7  1.7 - 7.7 K/uL Final   Lymphocytes Relative 04/07/2022 38  % Final   Lymphs Abs 04/07/2022 2.5  0.7 - 4.0 K/uL Final   Monocytes Relative 04/07/2022 6  % Final   Monocytes Absolute 04/07/2022 0.4  0.1 - 1.0 K/uL Final   Eosinophils Relative 04/07/2022 0  % Final   Eosinophils Absolute 04/07/2022 0.0  0.0 - 0.5 K/uL  Final   Basophils Relative 04/07/2022 0  % Final   Basophils Absolute 04/07/2022 0.0  0.0 - 0.1 K/uL Final   Immature Granulocytes 04/07/2022 0  % Final   Abs Immature Granulocytes 04/07/2022  0.02  0.00 - 0.07 K/uL Final   Performed at [facility] [address] [place], [place] [address]   Glucose-Capillary 04/08/2022 220 (H)  70 - 99 mg/dL Final   Glucose reference range applies only to samples taken after fasting for at least 8 hours.    Blood Alcohol level:  Lab Results  Component Value Date   ETH 309 (HH) 04/07/2022   ETH 262 (H) 97/98/9211    Metabolic Disorder Labs: No results found for: "HGBA1C", "MPG" No results found for: "PROLACTIN" Lab Results  Component Value Date   CHOL 217 (H) 05/15/2022   TRIG 111 05/15/2022   HDL 96 05/15/2022   CHOLHDL 2.3 05/15/2022   VLDL 22 05/15/2022   LDLCALC 99 05/15/2022    Therapeutic Lab Levels: No results found for: "LITHIUM" No results found for: "VALPROATE" No results found for: "CBMZ"  Physical Findings   Flowsheet Row ED from 05/15/2022 in [facility] ED from 04/07/2022 in [place] DEPT ED from 10/07/2021 in [place] DEPT  C-SSRS RISK CATEGORY No Risk Low Risk No Risk        Musculoskeletal  Strength & Muscle Tone: within normal limits Gait & Station: normal Patient leans: N/A  Psychiatric Specialty Exam  Presentation  General Appearance: Appropriate for Environment  Eye Contact:Good  Speech:Clear and Coherent  Speech Volume:Normal  Handedness:Right   Mood and Affect  Mood:Hopeless  Affect:Congruent   Thought Process  Thought Processes:Coherent  Descriptions of Associations:Intact  Orientation:Full (Time, Place and Person)  Thought Content:Logical  Diagnosis of Schizophrenia or Schizoaffective disorder in past: No    Hallucinations:Hallucinations: None  Ideas of  Reference:None  Suicidal Thoughts:Suicidal Thoughts: Yes, Passive SI Passive Intent and/or Plan: Without Intent  Homicidal Thoughts:Homicidal Thoughts: No   Sensorium  Memory:Immediate Good; Recent Good  Judgment:Good  Insight:Fair   Executive Functions  Concentration:Fair  Attention Span:Good  [place]   Psychomotor Activity  Psychomotor Activity:Psychomotor Activity: Normal   Assets  Assets:Intimacy; Social Support; Desire for Improvement   Sleep  Sleep:Sleep: Fair   Nutritional Assessment (For OBS and FBC admissions only) Has the patient had a weight loss or gain of 10 pounds or more in the last 3 months?: No Has the patient had a decrease in food intake/or appetite?: No Does the patient have dental problems?: No Does the patient have eating habits or behaviors that may be indicators of an eating disorder including binging or inducing vomiting?: No Has the patient recently lost weight without trying?: 0 Has the patient been eating poorly because of a decreased appetite?: 0 Malnutrition Screening Tool Score: 0    Physical Exam  Physical Exam HENT:     Head: Normocephalic.  Cardiovascular:     Rate and Rhythm: Normal rate and regular rhythm.     Heart sounds: Normal heart sounds. No murmur heard.    No friction rub. No gallop.  Pulmonary:     Effort: Pulmonary effort is normal.     Breath sounds: Normal breath sounds.  Abdominal:     General: Bowel sounds are normal.  Musculoskeletal:        General: Normal range of motion.     Comments: Numerous bruises on both arms.  Skin:    General: Skin is warm and dry.  Neurological:     General: No focal deficit present.     Mental Status: She is alert.     Motor: Tremor  present.     Comments: Bilateral low-amplitude tremor of the hands on extension.   Psychiatric:        Mood and Affect: Mood normal.    Review of Systems  Constitutional:  Negative for  chills and fever.  Gastrointestinal:  Negative for diarrhea, nausea and vomiting.  Musculoskeletal:  Positive for back pain, falls and neck pain.  Neurological:  Positive for tremors and headaches.  Psychiatric/Behavioral:  Negative for hallucinations and suicidal ideas.    Blood pressure (!) 140/91, pulse 94, temperature 98.4 F (36.9 C), temperature source Oral, resp. rate 20, last menstrual period 02/09/2014, SpO2 97 %. There is no height or weight on file to calculate BMI.  Treatment Plan Summary: Daily contact with patient to assess and evaluate symptoms and progress in treatment and Plan voluntary admission to [facility] today.  Arville Care, Student-PA 05/16/2022 10:52 AM

## 2022-05-16 NOTE — ED Notes (Addendum)
Pt remains awake in her room, stating "the little girl gave me medicine earlier and it didn't work, I usually take Ambien at home and double up on it sometimes, especially if I have migraines." Pt then asks when breakfast is. RN advised breakfast is around 8am and asked if pt is hungry now. Pt states she will "just go get a dollar and put it in the vending machine for a sandwich." RN advised pt that she doesn't have to pay for food, to just let staff know when she is hungry or thirsty. Pt verbalized understanding but declines to eat at this time. Pt remains laying in bed. No signs of acute distress noted. Will continue to monitor for safety.

## 2022-05-16 NOTE — ED Notes (Signed)
Pt up to nurse's station requesting ear plugs. Nursing staff advised we do not have ear plugs here. Pt stating she has been hearing people singing at the nurse's station and "saying rhymes," which is keeping her awake. Nursing staff advised no one has been singing or rhyming. Pt requested to move to a quieter room. Pt moved from room 163 to 172. Will continue to monitor for safety.

## 2022-05-16 NOTE — ED Notes (Addendum)
Pt up to nurse's station stating, "if you fly here do you need a ticket to get back and get it stamped? I'm in [place]." Pt then walked back to her room. 1st shift RN made aware of pt's behaviors throughout the night. Will continue to monitor for safety.

## 2022-05-16 NOTE — Progress Notes (Signed)
Adult Psychoeducational Group Note  Date:  05/16/2022 Time:  10:35 PM  Group Topic/Focus:  Wrap-Up Group:   The focus of this group is to help patients review their daily goal of treatment and discuss progress on daily workbooks.  Participation Level:  Active  Participation Quality:  Appropriate  Affect:  Anxious and Irritable  Cognitive:  Disorganized, Confused, Delusional, and Hallucinating  Insight: Lacking and Limited  Engagement in Group:  Lacking, Limited, and Poor  Modes of Intervention:  Discussion  Additional Comments:  Pt stated her goal for today was to focus on her treatment plan. Pt stated she accomplished her goal today. Pt stated she talked with her doctor and with her social worker about her care today. Pt rated her overall day a 8 out of 10. Pt stated she was able to contact her mother and son today which improved her overall day. Pt stated she felt better about herself tonight. Pt stated staff brought back all her meals. Pt stated she took all medications provided today. Pt stated her appetite was poor today. Pt rated her sleep last night was poor. Pt stated the goal tonight was to get some rest. Pt stated she had no physical pain tonight. Pt deny visual hallucinations and auditory issues tonight. Pt denies thoughts of harming herself or others. Pt stated she would alert staff if anything changed.  Candy Sledge 05/16/2022, 10:35 PM

## 2022-05-16 NOTE — ED Notes (Addendum)
Pt up to nurse's station stating, "they brought the coffee already right?" Nursing staff advised pt that she can have coffee with breakfast around 8am. Pt verbalized understanding and then went back down the hallway dancing. Will continue to monitor for safety.

## 2022-05-16 NOTE — Progress Notes (Signed)
Pt presents as paranoid, delusional and she is demanding to leave.  Pt is an elopement risk,  and she is attempting to leave the unit.  Pt yelling and throwing her bag of clothing in the hallway. AC aware.  AC talking to TTS PMHNP to get agitation protocol orders.

## 2022-05-16 NOTE — Progress Notes (Signed)
Admission Note: Patient is a 61 year old female admitted to the unit voluntarily from [facility] for alcohol abuse and depression.  Reports drinking on and off all her life and was sober for 45 days.  Reports her drinking caused her problems in her relationship of 13 years. Patient is hyperactive and restless during admission process.  Appears fidgety, clammy, flushed, and tremulous.  BAL unknown, not obtained at [facility].  Skin assessment and personal belongings completed.  Bruises and discoloration noted on both breasts, arms, back and legs.  Patient stated she fell on Thursday while intoxicated.  Admission plan of care reviewed, consent signed for treatment.  Patient oriented to the unit, staff and room.  Routine safety check initiated.  Upon getting to the unit patient became agitated, threatening to elope and demanding to leave.  Patient moved to 500 hall due to escalating agitation. Patient yelling,  slamming and kicking doors, verbally aggressive and abusive towards staff.  Stated, "I don't want to be around these people while pointing at peers."  PRN Geodon 20 mg and Ativan 2 mg IM given once.  Will continue to assess patient for safety.

## 2022-05-16 NOTE — ED Notes (Addendum)
Pt can be heard from nurse's station talking very loudly in her room. Nursing staff went to check on pt and obtain vital signs. Pt states she was talking to her friend Maudie Mercury because she believes her significant other has been cheating with Maudie Mercury. Pt states she had to figure out what she was going to say to [employer]. Vital signs obtained. Pt requesting blood work and UDS results and asking if she will be able to go home today. RN advised for pt to discuss this with provider today when she is reassessed. Will continue to monitor for safety.

## 2022-05-16 NOTE — ED Notes (Addendum)
Pt walking up hallway, appears paranoid and is looking up and down hallway. Pt briefly became unsteady on her feet while walking but caught herself. Pt now up to nurse's station stating that someone gave her medicine last night and she believes it was Ativan. This RN explained that I was the one who administered medications and reviewed what medications pt received. Pt then proceeds to ask for a blow dryer and hairspray and if she will be able to go home today. RN advised we do not have a blow dryer or hairspray here and that provider will reassess pt to determine disposition today. Pt verbalized understanding and went back to room. Will continue to monitor for safety.

## 2022-05-16 NOTE — Discharge Instructions (Signed)
Transfer to [facility]

## 2022-05-16 NOTE — Progress Notes (Signed)
Patient is very confused and loud.  Pt is being so loud at this time in her room she is waking up other patients.  She needs redirection often.

## 2022-05-16 NOTE — BH IP Treatment Plan (Addendum)
Interdisciplinary Treatment and Diagnostic Plan Update  05/16/2022 Time of Session: 10:00AM  [name] MRN: [id number]  Diagnosis:  Final diagnoses:  Alcohol abuse  Substance induced mood disorder ([place])     Current Medications:  Current Facility-Administered Medications  Medication Dose Route Frequency Provider Last Rate Last Admin   acetaminophen (TYLENOL) tablet 650 mg  650 mg Oral Q6H PRN Derrill Center, NP   650 mg at 05/16/22 0201   alum & mag hydroxide-simeth (MAALOX/MYLANTA) 200-200-20 MG/5ML suspension 30 mL  30 mL Oral Q4H PRN Derrill Center, NP       atorvastatin (LIPITOR) tablet 40 mg  40 mg Oral QHS Derrill Center, NP   40 mg at 05/15/22 2114   feeding supplement (ENSURE ENLIVE / ENSURE PLUS) liquid 237 mL  237 mL Oral BID BM Derrill Center, NP   237 mL at 05/15/22 1910   hydrOXYzine (ATARAX) tablet 25 mg  25 mg Oral Q6H PRN Derrill Center, NP   25 mg at 05/15/22 1249   loperamide (IMODIUM) capsule 2-4 mg  2-4 mg Oral PRN Derrill Center, NP       LORazepam (ATIVAN) tablet 1 mg  1 mg Oral Q6H PRN Derrill Center, NP   1 mg at 05/16/22 0157   LORazepam (ATIVAN) tablet 1 mg  1 mg Oral TID Derrill Center, NP       Followed by   Derrill Memo ON 05/17/2022] LORazepam (ATIVAN) tablet 1 mg  1 mg Oral BID Derrill Center, NP       Followed by   Derrill Memo ON 05/19/2022] LORazepam (ATIVAN) tablet 1 mg  1 mg Oral Daily Derrill Center, NP       magnesium hydroxide (MILK OF MAGNESIA) suspension 30 mL  30 mL Oral Daily PRN Derrill Center, NP       multivitamin with minerals tablet 1 tablet  1 tablet Oral Daily Derrill Center, NP   1 tablet at 05/16/22 1009   ondansetron (ZOFRAN-ODT) disintegrating tablet 4 mg  4 mg Oral Q6H PRN Derrill Center, NP       thiamine tablet 100 mg  100 mg Oral Daily Derrill Center, NP   100 mg at 05/16/22 1010   traZODone (DESYREL) tablet 50 mg  50 mg Oral QHS PRN Derrill Center, NP   50 mg at 05/15/22 2114   Current Outpatient Medications  Medication  Sig Dispense Refill   amitriptyline (ELAVIL) 25 MG tablet Take 50 mg by mouth at bedtime.     metoprolol succinate (TOPROL-XL) 50 MG 24 hr tablet Take 50 mg by mouth at bedtime.     zolpidem (AMBIEN) 10 MG tablet Take 10 mg by mouth at bedtime.     atorvastatin (LIPITOR) 40 MG tablet Take 40 mg by mouth at bedtime.     clonazePAM (KLONOPIN) 0.5 MG tablet Take 0.5 mg by mouth at bedtime as needed for anxiety.     diphenhydramine-acetaminophen (TYLENOL PM) 25-500 MG TABS tablet Take 1 tablet by mouth at bedtime as needed (sleep (when out of ambien)).     esomeprazole (NEXIUM) 20 MG capsule Take 20 mg by mouth daily as needed (acid reflux/indigestion).     estradiol (VIVELLE-DOT) 0.0375 MG/24HR Place 1 patch onto the skin 2 (two) times a week. Tuesday and Thursday     oxyCODONE-acetaminophen (ROXICET) 5-325 MG per tablet Take 1-2 tablets by mouth every 6 (six) hours as needed for moderate pain or severe  pain. Post-operatively (Patient taking differently: Take 2 tablets by mouth every 6 (six) hours as needed for moderate pain or severe pain.) 30 tablet 0   rizatriptan (MAXALT) 5 MG tablet Take 10 mg by mouth See admin instructions. Take 2 tablets (10 mg) by mouth at onset of migraine, may repeat in 2 hours if still needed     PTA Medications: Prior to Admission medications   Medication Sig Start Date End Date Taking? Authorizing Provider  amitriptyline (ELAVIL) 25 MG tablet Take 50 mg by mouth at bedtime. 09/17/21  Yes [provider]  metoprolol succinate (TOPROL-XL) 50 MG 24 hr tablet Take 50 mg by mouth at bedtime. 09/16/21  Yes [provider]  zolpidem (AMBIEN) 10 MG tablet Take 10 mg by mouth at bedtime.   Yes [provider]  atorvastatin (LIPITOR) 40 MG tablet Take 40 mg by mouth at bedtime. 08/05/21   [provider]  clonazePAM (KLONOPIN) 0.5 MG tablet Take 0.5 mg by mouth at bedtime as needed for anxiety.    [provider]   diphenhydramine-acetaminophen (TYLENOL PM) 25-500 MG TABS tablet Take 1 tablet by mouth at bedtime as needed (sleep (when out of ambien)).    [provider]  esomeprazole (NEXIUM) 20 MG capsule Take 20 mg by mouth daily as needed (acid reflux/indigestion).    [provider]  estradiol (VIVELLE-DOT) 0.0375 MG/24HR Place 1 patch onto the skin 2 (two) times a week. Tuesday and Thursday 07/28/21   [provider]  oxyCODONE-acetaminophen (ROXICET) 5-325 MG per tablet Take 1-2 tablets by mouth every 6 (six) hours as needed for moderate pain or severe pain. Post-operatively Patient taking differently: Take 2 tablets by mouth every 6 (six) hours as needed for moderate pain or severe pain. 11/12/14   Alexis Frock, MD  rizatriptan (MAXALT) 5 MG tablet Take 10 mg by mouth See admin instructions. Take 2 tablets (10 mg) by mouth at onset of migraine, may repeat in 2 hours if still needed 11/30/21   [provider]    Patient Stressors: Marital or family conflict   Substance abuse    Patient Strengths: Motivation for treatment/growth  Supportive family/friends   Treatment Modalities: Medication Management, Group therapy, Case management,  1 to 1 session with clinician, Psychoeducation, Recreational therapy.   Physician Treatment Plan for Primary and Secondary Diagnosis:  Final diagnoses:  Alcohol abuse  Substance induced mood disorder ([place])   Long Term Goal(s): Improvement in symptoms so as ready for discharge  Short Term Goals: Patient will verbalize feelings in meetings with treatment team members. Patient will attend at least of 50% of the groups daily. Pt will complete the PHQ9 on admission, day 3 and discharge.  Medication Management: Evaluate patient's response, side effects, and tolerance of medication regimen.  Therapeutic Interventions: 1 to 1 sessions, Unit Group sessions and Medication administration.  Evaluation of Outcomes: Not  Progressing  LCSW Treatment Plan for Primary Diagnosis:  Final diagnoses:  Alcohol abuse  Substance induced mood disorder ([place])    Long Term Goal(s): Safe transition to appropriate next level of care at discharge, Engage patient in therapeutic group addressing interpersonal concerns.  Short Term Goals: Identify minimum of 2 triggers associated with mental health/substance abuse issues with treatment team members. and Increase skills for wellness and recovery by attending 50% of scheduled groups.  Therapeutic Interventions: Assess for all discharge needs, 1 to 1 time with Social worker, Explore available resources and support systems, Assess for adequacy in community support network, Educate  family and significant other(s) on suicide prevention, Complete Psychosocial Assessment, Interpersonal group therapy.  Evaluation of Outcomes: Not Progressing   Progress in Treatment: Attending groups: No. Participating in groups: No. Taking medication as prescribed: Yes. Toleration medication: Yes. Family/Significant other contact made: Yes, individual(s) contacted:  the patient's sons  [name] and Lysbeth Galas Patient understands diagnosis: Yes. Discussing patient identified problems/goals with staff: Yes. Medical problems stabilized or resolved: Yes. Denies suicidal/homicidal ideation: Yes. Issues/concerns per patient self-inventory: No. Other: None   New problem(s) identified: Yes, Describe:  due collateral contact and previous documentation there has been heightened concern for the patient's safety and safety of others if patient were to discharge; Patient has been recommended for a higher level of care and has been referred for possible inpatient psychiatric treatment services.    New Short Term/Long Term Goal(s): [name] shared she wants to restart her normal routine of exercising, working in her garden and going to her outpatient provider, Dr. Toy Care.   Patient Goals:  "I just want to detox and go  home"   Discharge Plan or Barriers: Patient has been accepted to [facility] for inpatient psychiatric treatment. Patient has been accepted to room 406-1; Accepting provider is Dr. Mamie Levers, MD; ETA is to be determined.   Reason for Continuation of Hospitalization: Hallucinations Medication stabilization Withdrawal symptoms  Estimated Length of Stay: Transferring to [facility], Monday 05/16/22.    Last 3 [place] Suicide Severity Risk Score: [facility] ED from 05/15/2022 in [facility] ED from 04/07/2022 in [place] DEPT ED from 10/07/2021 in [place] DEPT  C-SSRS RISK CATEGORY No Risk Low Risk No Risk       Last PHQ 2/9 Scores:    05/15/2022    1:39 PM  Depression screen PHQ 2/9  Decreased Interest 2  Down, Depressed, Hopeless 2  PHQ - 2 Score 4  Altered sleeping 3  Tired, decreased energy 2  Change in appetite 0  Feeling bad or failure about yourself  2  Trouble concentrating 2  Moving slowly or fidgety/restless 2  Suicidal thoughts 3  PHQ-9 Score 18  Difficult doing work/chores Somewhat difficult    Scribe for Treatment Team: Marylee Floras, LCSW 05/16/2022 11:51 AM

## 2022-05-16 NOTE — Tx Team (Signed)
Initial Treatment Plan 05/16/2022 5:49 PM [name] YPP:[id number]    PATIENT STRESSORS: Health problems   Marital or family conflict   Medication change or noncompliance   Substance abuse     PATIENT STRENGTHS: Ability for insight  Communication skills  Motivation for treatment/growth  Supportive family/friends    PATIENT IDENTIFIED PROBLEMS: Alcohol Abuse  Depression  Anxiety  Ineffective coping skills               DISCHARGE CRITERIA:  Ability to meet basic life and health needs Adequate post-discharge living arrangements Motivation to continue treatment in a less acute level of care  PRELIMINARY DISCHARGE PLAN: Attend aftercare/continuing care group Outpatient therapy Return to previous living arrangement  PATIENT/FAMILY INVOLVEMENT: This treatment plan has been presented to and reviewed with the patient, [name], and/or family member.  The patient and family have been given the opportunity to ask questions and make suggestions.  Vela Prose, RN 05/16/2022, 5:49 PM

## 2022-05-16 NOTE — ED Notes (Signed)
NP to pt's room to assess pt. NP advised for RN to administer PRN Ativan and Trazodone '25mg'$ .

## 2022-05-16 NOTE — Progress Notes (Signed)
Pt redirected for coming out in the hallway and screaming there is dead people in her room. Writer check her room and assured her she is th only one in the room.

## 2022-05-16 NOTE — Progress Notes (Addendum)
Received [name] this AM awake and disorganized cognitively. She is restless, but directable. She verbalized not being able to sleep and requested sleeping medication. She was given her morning medications. She remained visible in the milieu and no change in the above behavior. She ate breakfast and lunch. She signed the voluntary consent to be transferred to [facility].

## 2022-05-16 NOTE — Progress Notes (Signed)
Up coming on the unit, pt been pacing , yelling , trying to go into other patient's rooms, pt trying to open every door she sees. Pt requiring frequent redirection. Pt appears very manic. Pt has Hx of ETOH abuse 2 bottles of wine per day, but pt ETOH level not appeared to be done before coming to [facility].

## 2022-05-16 NOTE — Progress Notes (Signed)
Writer check on pt because she was screaming in the room. Pt was undressed sitting on the floor. Writer asked pt was she ok. Pt stated she was feeding her dog and she had urinate on her self. Writer asked pt to put all clothes that were wet in a bag and we would wash them for her.

## 2022-05-16 NOTE — Progress Notes (Signed)
Geodon '20mg'$  IM, and Ativan 2 mg IM administered.

## 2022-05-16 NOTE — ED Notes (Signed)
Pt laying in her bed awake calm and cooperative. No c/o pain or distress. Will continue to monitor for safety

## 2022-05-16 NOTE — ED Notes (Addendum)
Pt up to nurses's station asking if RN had fallen. Pt then stating she likes to drink Ovaltine with ice cream mixed in and asking if nurses are talking about her. Pt walking aimlessly around nurse's station and then back to her room. Will continue to monitor for safety.

## 2022-05-16 NOTE — ED Notes (Signed)
[name] gave a urine specimen for PCOT, she was positive  only for benzos. This writer was unable to log the results after receiving  an order for the test.

## 2022-05-16 NOTE — Progress Notes (Signed)
Pt constantly coming out her room making noise waking up sleeping peers, pt appears very restless and fidgety , with appeared manic behaviors and agitation. Pt continues to require frequent redirection

## 2022-05-16 NOTE — Progress Notes (Signed)
Pt constantly coming out her room asking staff for alcohol to drink, pt requiring constant redirection

## 2022-05-16 NOTE — Progress Notes (Signed)
Pt is agitated and appears to be manic behaviors,  Pt believes there are dead people talking to her in the room.  Pt continues to scream out help me they trying to kill me.Writer informed pt she is the only one and the room. Writer sat outside pt door. Pt redirected for hitting the call bell several times. Pt is very confused.

## 2022-05-16 NOTE — ED Notes (Signed)
Pt requesting more medication for sleep. Ene Ajibola, NP notified.  

## 2022-05-16 NOTE — ED Notes (Signed)
CIWA 14. Asked NP if she wanted to come assess pt before PRN medications given. Awaiting response.

## 2022-05-17 ENCOUNTER — Ambulatory Visit (HOSPITAL_COMMUNITY): Payer: Medicare HMO | Attending: Internal Medicine

## 2022-05-17 DIAGNOSIS — N390 Urinary tract infection, site not specified: Secondary | ICD-10-CM

## 2022-05-17 DIAGNOSIS — F1994 Other psychoactive substance use, unspecified with psychoactive substance-induced mood disorder: Principal | ICD-10-CM

## 2022-05-17 DIAGNOSIS — R7401 Elevation of levels of liver transaminase levels: Secondary | ICD-10-CM | POA: Insufficient documentation

## 2022-05-17 DIAGNOSIS — D696 Thrombocytopenia, unspecified: Secondary | ICD-10-CM

## 2022-05-17 MED ORDER — LORAZEPAM 1 MG PO TABS
2.0000 mg | ORAL_TABLET | Freq: Three times a day (TID) | ORAL | Status: DC
  Administered 2022-05-17: 2 mg via ORAL
  Filled 2022-05-17: qty 2

## 2022-05-17 MED ORDER — LORAZEPAM 1 MG PO TABS
2.0000 mg | ORAL_TABLET | Freq: Four times a day (QID) | ORAL | Status: DC
  Administered 2022-05-17 – 2022-05-20 (×7): 2 mg via ORAL
  Filled 2022-05-17 (×8): qty 2

## 2022-05-17 MED ORDER — HALOPERIDOL 5 MG PO TABS
10.0000 mg | ORAL_TABLET | Freq: Four times a day (QID) | ORAL | Status: DC | PRN
Start: 2022-05-17 — End: 2022-05-25
  Administered 2022-05-17 – 2022-05-18 (×2): 10 mg via ORAL
  Filled 2022-05-17 (×2): qty 2

## 2022-05-17 MED ORDER — LORAZEPAM 1 MG PO TABS
2.0000 mg | ORAL_TABLET | Freq: Once | ORAL | Status: AC
  Administered 2022-05-17: 2 mg via ORAL

## 2022-05-17 MED ORDER — BENZTROPINE MESYLATE 1 MG PO TABS
1.0000 mg | ORAL_TABLET | Freq: Four times a day (QID) | ORAL | Status: DC | PRN
  Administered 2022-05-17 – 2022-05-18 (×3): 1 mg via ORAL
  Filled 2022-05-17: qty 2
  Filled 2022-05-17 (×2): qty 1

## 2022-05-17 MED ORDER — HALOPERIDOL LACTATE 5 MG/ML IJ SOLN
5.0000 mg | Freq: Once | INTRAMUSCULAR | Status: AC
  Filled 2022-05-17: qty 1

## 2022-05-17 MED ORDER — HALOPERIDOL 5 MG PO TABS
10.0000 mg | ORAL_TABLET | Freq: Two times a day (BID) | ORAL | Status: DC
  Administered 2022-05-17 – 2022-05-20 (×6): 10 mg via ORAL
  Filled 2022-05-17 (×11): qty 2

## 2022-05-17 MED ORDER — LORAZEPAM 2 MG/ML IJ SOLN: 2.0000 mg | Freq: Once | INTRAMUSCULAR | Status: AC

## 2022-05-17 MED ORDER — HALOPERIDOL 5 MG PO TABS
5.0000 mg | ORAL_TABLET | Freq: Two times a day (BID) | ORAL | Status: DC
  Administered 2022-05-17: 5 mg via ORAL
  Filled 2022-05-17 (×5): qty 1

## 2022-05-17 MED ORDER — HALOPERIDOL 5 MG PO TABS
ORAL_TABLET | ORAL | Status: AC
  Filled 2022-05-17: qty 1

## 2022-05-17 MED ORDER — ZIPRASIDONE MESYLATE 20 MG IM SOLR
10.0000 mg | Freq: Once | INTRAMUSCULAR | Status: AC
Start: 2022-05-17 — End: 2022-05-17
  Administered 2022-05-17: 10 mg via INTRAMUSCULAR
  Filled 2022-05-17 (×2): qty 20

## 2022-05-17 MED ORDER — DIAZEPAM 5 MG PO TABS
5.0000 mg | ORAL_TABLET | Freq: Once | ORAL | Status: AC
Start: 2022-05-17 — End: 2022-05-17
  Administered 2022-05-17: 5 mg via ORAL
  Filled 2022-05-17: qty 1

## 2022-05-17 MED ORDER — SULFAMETHOXAZOLE-TRIMETHOPRIM 800-160 MG PO TABS
1.0000 | ORAL_TABLET | Freq: Two times a day (BID) | ORAL | Status: DC
Start: 2022-05-17 — End: 2022-05-17

## 2022-05-17 MED ORDER — CEFADROXIL 500 MG PO CAPS
500.0000 mg | ORAL_CAPSULE | Freq: Two times a day (BID) | ORAL | Status: DC
  Administered 2022-05-17 – 2022-05-24 (×13): 500 mg via ORAL
  Filled 2022-05-17 (×17): qty 1

## 2022-05-17 MED ORDER — LORAZEPAM 1 MG PO TABS
2.0000 mg | ORAL_TABLET | Freq: Four times a day (QID) | ORAL | Status: DC | PRN
  Administered 2022-05-17 – 2022-05-18 (×2): 2 mg via ORAL
  Filled 2022-05-17 (×2): qty 2

## 2022-05-17 MED ORDER — HALOPERIDOL 5 MG PO TABS
5.0000 mg | ORAL_TABLET | Freq: Once | ORAL | Status: AC
  Administered 2022-05-17: 5 mg via ORAL
  Filled 2022-05-17: qty 1

## 2022-05-17 NOTE — Progress Notes (Signed)
Nursing  Close Observation note D:Pt observed yelling in her room, talking loud and cursing at staff. RR even and unlabored. No distress noted. A: Close observation continues for safety  R: pt remains safe

## 2022-05-17 NOTE — Progress Notes (Signed)
Pt was let out of closed door seclusion at 0530 , pt walked down to her room un assisted with much re-direction, pt continued to be delusional as she was talking to people not seen by staff.

## 2022-05-17 NOTE — Progress Notes (Signed)
Pt visible in quiet room with the door open on initial interactions. A & O to self and place only. Pt is very disorganized with flight of ideas, loose associations and very pressured speech. Noted yelling, verbally abusive towards staff and peers, actively responding to internal stimuli; conversing with blankets, walls and cups she believed to be her pet dog, son and her friend's child that she believed to be babysitting. Compliant with medications when offered. Denies SI, HI, AVH and pain at this time.  1:1 observation maintained for safety, assigned [facility] staff in attendance at all times. Ate 50% of lunch and tolerated fluids well. Pt continue to need frequent verbal redirections to cooperative with care which is to no avail thus far.

## 2022-05-17 NOTE — Consult Note (Signed)
Initial Consultation Note   Patient: [name] ATF:[id number] DOB: August 19, 1961 PCP: Marda Stalker, PA-C DOA: 05/16/2022 DOS: the patient was seen and examined on 05/17/2022 Primary service: Janine Limbo, MD  Referring physician: Dr. Caswell Corwin Reason for consult: UTI, thrombocytopenia  Assessment and Plan: UTI     - check UCx     - start duricef 546m BID x 5 days     - encourage fluids     - narrow abx based on UCx results  Thrombocytopenia     - likely d/t liver disease     - she has no impending surgery and she is without evidence of bleed; no transfusions necessary at this time     - check pathologist smear; if abnormal, recommend your team consult hematology  Elevated LFTs     - hepatitis panel is negative     - likely from EtOH abuse     - can check RUQ ab [place] if no obstruction seen, she can follow up with PCP/GI outpt     - hold lipitor     - primary team should review their medications w/ their pharmacy team and avoid hepatotoxins or adjust dosages  Remainder per primary team  TRH will continue to follow the patient.  HPI: MJOETTA DELPRADOis a 61y.o. female with past medical history of HTN, HLD, GERD, MDD, EtOH abuse. Presenting with alcohol dependence and depression.  [facility]: MBayli Quesinberryis a 61year old female with reported anxiety, depression, PTSD, AUD history of self [facility] for alcohol and depression.  She was accompanied by family and friends and stated that she has been drinking on and off for most of her life. Patient seen and chart reviewed. Most recent CIWA 5. Patient with some bizarre and paranoid behavior last night with concerns for hallucinations. Patient denies hallucinations and states that she has been "overthinking" her relationship with her partner.   TRH was called today with concerns for increased confusion. Lab work was obtained yesterday that was  concerning for possible UTI. It also showed elevated LFTs and low platelets. Chart review of her labs for the past several months shows that she has chronic elevation of her LFTs and thrombocytopenia; although her plts are deficient to a lesser degree one month ago. Given her drinking history and apparent increase in the last couple of month, these finding are not surprising. There is no evidence of bleed at this time. She has an un-reactive hepatitis panel. During our interview, she is actively hallucinating and is very disorganized.   Review of Systems: As mentioned in the history of present illness. All other systems reviewed and are negative. Past Medical History:  Diagnosis Date   [place]    [facility]--  11-07-2014 pt states no alcohol since 09-11-2014 (approx)   Anxiety    Frequency of urination    GERD (gastroesophageal reflux disease)    Headache(784.0)    Hypertension    no meds per pt,  states bp been ok    Major depression    PTSD (post-traumatic stress disorder)    Renal calculus, right    Right ureteral stone    Urgency of urination    Past Surgical History:  Procedure Laterality Date   BILATERAL SALPINGECTOMY Bilateral 03/19/2014   Procedure: BILATERAL SALPINGECTOMY;  Surgeon: RLovenia Kim MD;  [facility];  Service: Gynecology;  Laterality:  Bilateral;   CESAREAN SECTION  1991   HOLMIUM LASER APPLICATION Right 56/01/1307   Procedure: HOLMIUM LASER APPLICATION;  Surgeon: Alexis Frock, MD;  Location: [facility];  Service: Urology;  Laterality: Right;   ROBOTIC ASSISTED TOTAL HYSTERECTOMY N/A 03/19/2014   Procedure: ROBOTIC ASSISTED TOTAL HYSTERECTOMY;  Surgeon: Lovenia Kim, MD;  Location: [facility];  Service: Gynecology;  Laterality: N/A;   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  Her smoking use included cigarettes. She has never used smokeless tobacco. She reports current alcohol use of about 20.0 standard drinks of alcohol per week. She reports current drug use. Drugs: Marijuana and Benzodiazepines.  No Known Allergies  Family History  Problem Relation Age of Onset   Alcohol abuse Father     Prior to Admission medications   Medication Sig Start Date End Date Taking? Authorizing Provider  amitriptyline (ELAVIL) 25 MG tablet Take 50 mg by mouth at bedtime. 09/17/21   [provider]  atorvastatin (LIPITOR) 40 MG tablet Take 40 mg by mouth at bedtime. 08/05/21   [provider]  esomeprazole (NEXIUM) 20 MG capsule Take 20 mg by mouth daily as needed (acid reflux/indigestion).    [provider]  estradiol (VIVELLE-DOT) 0.0375 MG/24HR Place 1 patch onto the skin 2 (two) times a week. Tuesday and Thursday 07/28/21   [provider]  LORazepam (ATIVAN) 1 MG tablet Take 1 tablet (1 mg total) by mouth daily. Continue taper 6/12 take 1 mg at 1600 and 2200 6/13 take 1 mg at 1000 and 2200 6/14 take 1 mg at 1000 6/15 take 1 mg at 1000 05/19/22   Ival Bible, MD  metoprolol succinate (TOPROL-XL) 50 MG 24 hr tablet Take 50 mg by mouth at bedtime. 09/16/21   [provider]  rizatriptan (MAXALT) 5 MG tablet Take 10 mg by mouth See admin instructions. Take 2 tablets (10 mg) by mouth at onset of migraine, may repeat in 2 hours if still needed 11/30/21   [provider]    Physical Exam: Vitals:   05/16/22 1704 05/17/22 0538 05/17/22 0540 05/17/22 1205  BP:  131/86 109/86   Pulse:  100 (!) 117 (!) 120  Resp: 18  20   Temp: 97.8 F (36.6 C) 97.9 F (36.6 C)    TempSrc: Oral Oral    SpO2: 98%  97%   Weight: 66.7 kg     Height: '5\' 1"'  (1.549 m)      General: 61 y.o. female resting in bed in NAD Eyes: PERRL, normal sclera ENMT: Nares patent w/o discharge, orophaynx clear, dentition normal, ears w/o  discharge/lesions/ulcers Neck: Supple, trachea midline Cardiovascular: RRR, +S1, S2, no m/g/r, equal pulses throughout Respiratory: CTABL, no w/r/r, normal WOB GI: BS+, NDNT, no masses noted, no organomegaly noted MSK: No e/c/c Neuro: alert but confused, non-focal exam Psyc: actively hallucinating, disorganized, able to redirect fairly easily though  Data Reviewed:   Na+  135 K+  3.8 Glucose  127 Alk phos  135 AST  312 ALT  145 WBC  3.6 Hgb  13.7 Plt  63    Family Communication: None at bedside Thank you very much for involving [place] in the care of your patient.  Author: Jonnie Finner, DO 05/17/2022 3:07 PM  For on call review [email or web address].

## 2022-05-17 NOTE — Progress Notes (Signed)
Pt back from [facility] , pt continues to be loud and labile. Pt needing constant redirection

## 2022-05-17 NOTE — Progress Notes (Signed)
Pt oriented to person only, pt delusional to where she is, pt has to be reoriented to her location. Pt made close observation  while awake due to pt manic and delusional behaviors for pt safety.

## 2022-05-17 NOTE — Progress Notes (Signed)
Close Observation note D:Pt observed trying to open the window in her room, pt talking to people not seen by staff. RR even and unlabored. No distress noted.pt continues to be restless A: Close observation continues for safety  R: pt remains safe

## 2022-05-17 NOTE — Progress Notes (Signed)
Pt continues to escalate, pt continued to yell waking up peers, pt placed in closed door seclusion at 0145 for pt safety.

## 2022-05-17 NOTE — Progress Notes (Signed)
NP was notify of patient continued aggression, order for Geodon 10 mg Inj  was given,  however patient was still aggressive and increasing verbal abusive towards nursing staff.  New order for seclusion was given.  Face to Face observation of patient was done.  Pt was observes,  while in room moving mattress back and forth,  using curse and threaten words,  towards nursing staff.

## 2022-05-17 NOTE — Progress Notes (Signed)
Pt redirected for screaming and hitting the call bell. Pt stated all the cats and dogs where hunger and jean her boyfriend of 15 years would like something to eat. Writer assured pt she was the only one in the room and offered pt a snack. Pt declined.

## 2022-05-17 NOTE — Progress Notes (Signed)
Pt observed actively hallucinating in quiet room, conversing with unseen others including animals. She's very disorganized, delusional, believes she's babysitting a friend's child, meeting family for dinner and actively searching for her cat and handbag. Pt is Intrusive, tangential with flight of ideas, loose in nature on interactions. Unable to independently hold her cup due to moderate tremors in bilateral hands. Continues to need frequent verbal redirections related to escalating agitation, confusion and intrusive behavior. Compliant with medications (Ativan, Haldol) when offered earlier without change in current status. Pt received PRN Ibuprofen 400 mg PO for c/o back pain 6/10 and reports pain 2/10 when reassessed. 1:1 observation maintained for safety, assigned [facility] staff in attendance at all times. Ate 50% of lunch and tolerated fluids well. Pt continue to need frequent verbal redirections to cooperative with care which is to no avail thus far.

## 2022-05-17 NOTE — Progress Notes (Signed)
Pt continues to pace in the quiet room, delusional , yelling at staff at times with derogatory and racial remarks

## 2022-05-17 NOTE — Progress Notes (Signed)
   05/17/22 2100  Psych Admission Type (Psych Patients Only)  Admission Status Voluntary  Psychosocial Assessment  Patient Complaints Substance abuse;Suspiciousness;Restlessness;Hyperactivity;Anxiety;Agitation  Eye Contact Fair  Facial Expression Anxious  Affect Anxious;Preoccupied;Labile  Speech Pressured;Rapid  Interaction Intrusive  Motor Activity Restless;Fidgety  Appearance/Hygiene Disheveled  Behavior Characteristics Resistant to care;Restless;Agressive verbally;Aggressive physically;Anxious;Agitated  Mood Labile;Suspicious;Preoccupied;Threatening  Aggressive Behavior  Effect No apparent injury  Thought Process  Coherency Circumstantial  Content WDL  Delusions WDL  Perception WDL  Hallucination None reported or observed  Judgment Impaired  Confusion Mild  Danger to Self  Current suicidal ideation? Denies  Danger to Others  Danger to Others None reported or observed

## 2022-05-17 NOTE — Progress Notes (Addendum)
Pt continues to be loud, labile and argumentative. Pt refused to go get [place], but staff convinced pt to got to the hospital , pt left at 2030. Pt very argumentative, delusional needing much redirection continuously.

## 2022-05-17 NOTE — Group Note (Signed)
Recreation Therapy Group Note   Group Topic:Health and Wellness  Group Date: 05/17/2022 Start Time: 9924 End Time: 1025 Facilitators: Victorino Sparrow, LRT,CTRS Location: [address] Dayroom   Goal Area(s) Addresses:  Patient will define components of whole wellness. Patient will verbalize benefit of whole wellness.  Group Description:  Exercise.  LRT and patients discussed the importance of wellness and the main elements that make it up (mental, physical and spiritual).  LRT then explained to group they would be focusing on the physical aspect with some chair exercises.  LRT led group in a series of stretches before allowing each patient to lead group in an exercise of their choosing.  The group was going for at least 30 minutes of movement.  Patients were encouraged to get water or take breaks if needed.   Affect/Mood: N/A   Participation Level: Did not attend    Clinical Observations/Individualized Feedback:     Plan: Continue to engage patient in RT group sessions 2-3x/week.   Victorino Sparrow, LRT,CTRS 05/17/2022 12:40 PM

## 2022-05-17 NOTE — Progress Notes (Signed)
Adult Psychoeducational Group Note  Date:  05/17/2022 Time:  11:16 PM  Group Topic/Focus:  Wrap-Up Group:   The focus of this group is to help patients review their daily goal of treatment and discuss progress on daily workbooks.  Participation Level:  Did Not Attend  Participation Quality:  Did Not Attend  Affect:   Did Not Attend  Cognitive:   Did Not Attend  Insight: None  Engagement in Group:   Did Not Attend  Modes of Intervention:   Did Not Attend  Additional Comments:  Pt was at [facility].  Candy Sledge 05/17/2022, 11:16 PM
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
## 2022-05-17 NOTE — Progress Notes (Signed)
   05/17/22 0515  Sleep  Number of Hours 0
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
## 2022-05-17 NOTE — Progress Notes (Signed)
Nursing 1:1 note D:Pt observed banging on seclusion walls, cursing at staff threatening. RR even and unlabored. No distress noted. A: 1:1 observation continues for safety , pt informed of criteria for release from quiet room R: pt remains safe , pt continues to appear to be delusional and Hallucinating at times, pt very restless

## 2022-05-17 NOTE — Progress Notes (Signed)
Pt pacing the hallway, pt refused he lab work. Pt waking up her peers, pt going in other patients rooms, pt constantly stating she is leaving. Pt walking with her stuff saying she is going. Pt needing constant redirection

## 2022-05-17 NOTE — Progress Notes (Signed)
Tried to contact pt name on Restrictive procedures form [place]) , with out success.

## 2022-05-17 NOTE — [facility] Suicide Risk Assessment (Signed)
[facility] Admission Suicide Risk Assessment   Nursing information obtained from:  Patient Demographic factors:  Low socioeconomic status, Living alone Current Mental Status:  NA Loss Factors:  NA Historical Factors:  NA Risk Reduction Factors:  Positive social support  Total Time spent with patient: 45 minutes Principal Problem: Substance induced mood disorder ([place]) Diagnosis:  Principal Problem:   Substance induced mood disorder ([place])  Subjective Data: [name] is a 61 y.o., female with a past psychiatric history significant for depression and alcohol dependence who [facility] emergency room for evaluation and management of alcohol intoxication, substance-induced mood disorder.    Since arrival on the unit from emergency room patient has been agitated psychotic and disoriented received multiple doses of Zyprexa and Geodon as well as Ativan to help calm her here, she is in seclusion room secondary to agitation, not being redirectable running in the hallway getting into another patient's room disruptive wanting to leave later reportedly confused responding to stimuli hallucinating with auditory and visual hallucinations reported. During interview today on the inpatient unit, the patient is confused and disoriented unable to provide any reliable history information except very limited she is oriented to being on behavioral health unit but confused about day or month or year disoriented to situation unable to tell me how she ended up in the hospital apparently having visual hallucinations during evaluation saying she is seeing a baby on the floor in the seclusion room and talking to her friend "I am talking to [employer] making plans" she is noted to have multiple bruises, hyperand agitated hard to redirect but no physical aggression noted at time of my evaluation, she does appear responding to stimuli as noted above, psychotic and delusional, she is unable to  provide any reliable history regarding her home medications or medical problems.  Most history information noted below are obtained from patient's son over the phone and per chart review.   Patient reportedly had worsening depression recently as reported by her son over the phone but unable to assess symptoms of depression mania or PTSD secondary to ongoing confusion  Continued Clinical Symptoms:  Alcohol Use Disorder Identification Test Final Score (AUDIT): 14 The "Alcohol Use Disorders Identification Test", Guidelines for Use in Primary Care, Second Edition.  World Pharmacologist [facility]). Score between 0-7:  no or low risk or alcohol related problems. Score between 8-15:  moderate risk of alcohol related problems. Score between 16-19:  high risk of alcohol related problems. Score 20 or above:  warrants further diagnostic evaluation for alcohol dependence and treatment.   CLINICAL FACTORS:   Depression:   Anhedonia   Musculoskeletal: Strength & Muscle Tone: within normal limits Gait & Station: normal Patient leans: N/A  Psychiatric Specialty Exam:  Presentation  General Appearance: Appropriate for Environment; Casual  Eye Contact:Fair  Speech:Clear and Coherent; Normal Rate  Speech Volume:Normal  Handedness:Right   Mood and Affect  Mood:-- ("good")  Affect:Full Range; Labile   Thought Process  Thought Processes:-- (circumstantial)  Descriptions of Associations:Circumstantial  Orientation:Full (Time, Place and Person)  Thought Content:Perseveration  History of Schizophrenia/Schizoaffective disorder:No  Duration of Psychotic Symptoms:No data recorded Hallucinations:Hallucinations: None  Ideas of Reference:None  Suicidal Thoughts:Suicidal Thoughts: No  Homicidal Thoughts:Homicidal Thoughts: No   Sensorium  Memory:Immediate Fair; Recent Fair; Remote Poor  Judgment:Poor  Insight:Poor   Executive Functions  Concentration:Fair  Attention  Span:Fair  Green  Language:Good   Psychomotor Activity  Psychomotor Activity:Psychomotor Activity: Tremor (  tremor in BUE with outstretched hands)   Assets  Assets:Communication Skills; Desire for Improvement; Social Support; Resilience; Housing   Sleep  Sleep:Sleep: Poor    Physical Exam: Physical Exam ROS Blood pressure 109/86, pulse (!) 120, temperature 97.9 F (36.6 C), temperature source Oral, resp. rate 20, height '5\' 1"'$  (1.549 m), weight 66.7 kg, last menstrual period 02/09/2014, SpO2 97 %. Body mass index is 27.78 kg/m.   COGNITIVE FEATURES THAT CONTRIBUTE TO RISK:  Polarized thinking    SUICIDE RISK:   Severe:  Frequent, intense, and enduring suicidal ideation, specific plan, no subjective intent, but some objective markers of intent (i.e., choice of lethal method), the method is accessible, some limited preparatory behavior, evidence of impaired self-control, severe dysphoria/symptomatology, multiple risk factors present, and few if any protective factors, particularly a lack of social support.  PLAN OF CARE: Safety and Monitoring:             -- Patient was admitted voluntarily, will switch to involuntary admission to inpatient psychiatric unit for safety, stabilization and treatment             -- Daily contact with patient to assess and evaluate symptoms and progress in treatment             -- Patient's case to be discussed in multi-disciplinary team meeting             -- Observation Level : q15 minute checks             -- Vital signs:  q12 hours             -- Precautions: suicide, elopement, and assault   2. Medications:              Start Ativan 2 mg times daily for alcohol withdrawal, monitor efficacy and fever             Continue to monitor CIWA score             Start Haldol 10 mg twice daily to address psychosis secondary to agitation             Start Haldol Ativan Cogentin as needed for agitation              Continue Elavil for sleep/medication --  The risks/benefits/side-effects/alternatives to this medication were discussed in detail with the patient and time was given for questions. The patient consents to medication trial.                -- Metabolic profile and EKG monitoring obtained while on an atypical antipsychotic (BMI: Lipid Panel: HbgA1c: QTc:)              Will obtain urine culture to address UTI, start antibiotic based on results             Continue home medications for hyperlipidemia and GERD                           3. Pertinent labs: elevated liver function test 05/24/2021 but going off since then.  Also low platelet count, had normal platelets in 2017 and has been declining gradually since November 2022, recently 48 in emergency room prior to admission.  UA suggestive of UTI, EKG in the emergency room QTc 415        Lab ordered: Obtain urine culture, repeat CBC and liver function test tomorrow morning   4.  Hospitalist consult was requested to  assist with management of UTI, elevated liver enzymes, low platelet count, will follow.                 5. Group and Therapy: -- Encouraged patient to participate in unit milieu and in scheduled group therapies                           As patient improves cognitively we will address options for treatment for alcohol dependence including inpatient residential rehab versus outpatient rehab for alcohol dependence, limitations with dependence on insurance coverage as well as patient insight and willingness for treatment.  I certify that inpatient services furnished can reasonably be expected to improve the patient's condition.   Jill Petersen Winfred Leeds, MD 05/17/2022, 3:24 PM

## 2022-05-18 ENCOUNTER — Encounter (HOSPITAL_COMMUNITY): Payer: Self-pay

## 2022-05-18 ENCOUNTER — Encounter (HOSPITAL_COMMUNITY): Payer: Self-pay | Admitting: Psychiatry

## 2022-05-18 ENCOUNTER — Inpatient Hospital Stay (HOSPITAL_COMMUNITY): Payer: Medicare HMO

## 2022-05-18 DIAGNOSIS — F1994 Other psychoactive substance use, unspecified with psychoactive substance-induced mood disorder: Principal | ICD-10-CM

## 2022-05-18 LAB — CBC WITH DIFFERENTIAL/PLATELET
Abs Immature Granulocytes: 0.02 10*3/uL (ref 0.00–0.07)
Basophils Absolute: 0 10*3/uL (ref 0.0–0.1)
Basophils Relative: 0 %
Eosinophils Absolute: 0.1 10*3/uL (ref 0.0–0.5)
Eosinophils Relative: 1 %
HCT: 35.1 % — ABNORMAL LOW (ref 36.0–46.0)
Hemoglobin: 11.9 g/dL — ABNORMAL LOW (ref 12.0–15.0)
Immature Granulocytes: 0 %
Lymphocytes Relative: 21 %
Lymphs Abs: 1.2 10*3/uL (ref 0.7–4.0)
MCH: 34.7 pg — ABNORMAL HIGH (ref 26.0–34.0)
MCHC: 33.9 g/dL (ref 30.0–36.0)
MCV: 102.3 fL — ABNORMAL HIGH (ref 80.0–100.0)
Monocytes Absolute: 0.8 10*3/uL (ref 0.1–1.0)
Monocytes Relative: 14 %
Neutro Abs: 3.5 10*3/uL (ref 1.7–7.7)
Neutrophils Relative %: 64 %
Platelets: 85 10*3/uL — ABNORMAL LOW (ref 150–400)
RBC: 3.43 MIL/uL — ABNORMAL LOW (ref 3.87–5.11)
RDW: 15.8 % — ABNORMAL HIGH (ref 11.5–15.5)
WBC: 5.5 10*3/uL (ref 4.0–10.5)
nRBC: 0 % (ref 0.0–0.2)

## 2022-05-18 LAB — COMPREHENSIVE METABOLIC PANEL
ALT: 102 U/L — ABNORMAL HIGH (ref 0–44)
AST: 157 U/L — ABNORMAL HIGH (ref 15–41)
Albumin: 4.3 g/dL (ref 3.5–5.0)
Alkaline Phosphatase: 107 U/L (ref 38–126)
Anion gap: 12 (ref 5–15)
BUN: 23 mg/dL — ABNORMAL HIGH (ref 6–20)
CO2: 24 mmol/L (ref 22–32)
Calcium: 9.8 mg/dL (ref 8.9–10.3)
Chloride: 102 mmol/L (ref 98–111)
Creatinine, Ser: 0.89 mg/dL (ref 0.44–1.00)
GFR, Estimated: >60 mL/min (ref >60)
Glucose, Bld: 108 mg/dL — ABNORMAL HIGH (ref 70–99)
Potassium: 3.4 mmol/L — ABNORMAL LOW (ref 3.5–5.1)
Sodium: 138 mmol/L (ref 135–145)
Total Bilirubin: 2.4 mg/dL — ABNORMAL HIGH (ref 0.3–1.2)
Total Protein: 7.1 g/dL (ref 6.5–8.1)

## 2022-05-18 LAB — HEPATIC FUNCTION PANEL
ALT: 102 U/L — ABNORMAL HIGH (ref 0–44)
AST: 157 U/L — ABNORMAL HIGH (ref 15–41)
Albumin: 4.3 g/dL (ref 3.5–5.0)
Alkaline Phosphatase: 107 U/L (ref 38–126)
Bilirubin, Direct: 0.6 mg/dL — ABNORMAL HIGH (ref 0.0–0.2)
Indirect Bilirubin: 1.8 mg/dL — ABNORMAL HIGH (ref 0.3–0.9)
Total Bilirubin: 2.4 mg/dL — ABNORMAL HIGH (ref 0.3–1.2)
Total Protein: 7.1 g/dL (ref 6.5–8.1)

## 2022-05-18 LAB — GLUCOSE, CAPILLARY: Glucose-Capillary: 106 mg/dL — ABNORMAL HIGH (ref 70–99)

## 2022-05-18 MED ORDER — LORAZEPAM 2 MG/ML IJ SOLN
INTRAMUSCULAR | Status: AC
  Administered 2022-05-18: 1 mg via INTRAMUSCULAR
  Filled 2022-05-18: qty 1

## 2022-05-18 NOTE — ED Notes (Signed)
Transportation attempted to safe transport via phone call at this time.

## 2022-05-18 NOTE — Progress Notes (Addendum)
[facility] MD Progress Note  05/18/2022 1:40 PM PRERANA STRAYER  MRN:  [id number]   Reason for Admission:  [name] is a 61 y.o. female with a history of depression and alcohol dependence, who was initially admitted for inpatient psychiatric hospitalization on 05/16/2022 for management of increased depression, alcohol use, alcohol withdrawals. The patient is currently on Hospital Day 2.   Chart Review from last 24 hours:  The patient's chart was reviewed and nursing notes were reviewed.  Patient has been requiring multiple doses of as needed medications for agitation and aggression despite that yesterday she remained hard to redirect and remained in seclusion room with later in the evening she became unsteady and fell, was sent to the emergency room for CT which got completed this morning that she was sent back after medically cleared by emergency room provider.  Yesterday patient was also seen by hospitalist consult recommended antibiotic UTI and to monitor liver function and platelet count.  When patient came back from emergency room earlier this morning she was noted to continue to be agitated and aggressive and after was given as needed medication for agitation she fell asleep in seclusion room. CIWA score continues to be elevated last time at midnight 14 Lab review indicates liver function tests improving but still elevated, platelet count improving yet still low. Head CT result was reviewed and indicated brain atrophy and vascular lesions indicating dementia for multiple reasons probably related to alcohol abuse as well as vascular dementia.  Information Obtained Today During Patient Interview: The patient was seen and evaluated on the unit.  She is asleep in the seclusion room lying down on mattress breathing movement noted, I did not wake her up to avoid triggering agitation or aggression at this time.  We attempted to evaluate patient around 3 PM but she remains asleep, discussed with the staff  to monitor patient's ambulatory to ensure hydration, also monitor patient compliance is met especially antibiotic treatment for UTI given it is probably partially in current delirium and confusion, will follow.   Sleep  Sleep: Poor  Principal Problem: Substance induced mood disorder (HCC) Diagnosis: Principal Problem:   Substance induced mood disorder [place])    Past Psychiatric History: Prior Psychiatric diagnoses: Depression and alcohol dependence Past Psychiatric Hospitalizations: "[facility]" notes was last admitted to psychiatric unit in November 2022 but unsure why   History of self mutilation: Not reported Past suicide attempts: Son reports history of suicide ideation but unsure if she had any attempts Past history of HI, violent or aggressive behavior: None reported   Past Psychiatric medications trials: Recently on Klonopin and Ambien prescribed by outpatient psychiatrist, otherwise unknown History of ECT/TMS: None reported   Outpatient psychiatric Follow up: Sees outpatient psychiatric provider Dr. Robina Ade in [place]. Prior Outpatient Therapy: None reported  Past Medical History:  Past Medical History:  Diagnosis Date   Alcohol dependence ([place])    recent IVC at [facility] 10/ 2015 by son--  11-07-2014 pt states no alcohol since 09-11-2014 (approx)   Anxiety    Frequency of urination    GERD (gastroesophageal reflux disease)    Headache(784.0)    Hypertension    no meds per pt,  states bp been ok    Major depression    PTSD (post-traumatic stress disorder)    Renal calculus, right    Right ureteral stone    Urgency of urination     Past Surgical History:  Procedure Laterality Date   BILATERAL SALPINGECTOMY Bilateral 03/19/2014  Procedure: BILATERAL SALPINGECTOMY;  Surgeon: Lovenia Kim, MD;  Location: [facility];  Service: Gynecology;  Laterality: Bilateral;   CESAREAN SECTION  1991   HOLMIUM LASER APPLICATION Right 29/08/2425   Procedure: HOLMIUM LASER APPLICATION;   Surgeon: Alexis Frock, MD;  Location: [facility];  Service: Urology;  Laterality: Right;   ROBOTIC ASSISTED TOTAL HYSTERECTOMY N/A 03/19/2014   Procedure: ROBOTIC ASSISTED TOTAL HYSTERECTOMY;  Surgeon: Lovenia Kim, MD;  Location: [facility];  Service: Gynecology;  Laterality: N/A;   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   Family History:  Family History  Problem Relation Age of Onset   Alcohol abuse Father    Family Psychiatric  History: Psychiatric illness: Multiple family members with depression including both sons, brother and mother Suicide: None reported Substance Abuse: Father and other family members were alcoholics   Social History: Living situation: Currently lives alone in [place]: Sons are supportive but they live in [place] and [place] Marital Status: Was married once currently divorced Children: 2 sons, 1 lives in [place] and one lives in [place] Education: Unknown Employment: On Office manager service: None noted Legal history: None noted Trauma: Son reports patient has history of trauma growing up but unable to provide specific Access to guns: None noted  Sleep: Poor  Appetite:  Poor  Current Medications: Current Facility-Administered Medications  Medication Dose Route Frequency Provider Last Rate Last Admin   alum & mag hydroxide-simeth (MAALOX/MYLANTA) 200-200-20 MG/5ML suspension 30 mL  30 mL Oral Q4H PRN Ival Bible, MD       amitriptyline (ELAVIL) tablet 25 mg  25 mg Oral QHS Ival Bible, MD   25 mg at 05/17/22 2033   haloperidol (HALDOL) tablet 10 mg  10 mg Oral Q6H PRN Winfred Leeds, Cristela Stalder, MD   10 mg at 05/18/22 0002   And   LORazepam (ATIVAN) tablet 2 mg  2 mg Oral Q6H PRN Winfred Leeds, Keyara Ent, MD   2 mg at 05/18/22 0001   And   benztropine (COGENTIN) tablet 1 mg  1 mg Oral Q6H PRN Winfred Leeds, Patrina Andreas, MD   1 mg at 05/18/22 1012   cefadroxil  (DURICEF) capsule 500 mg  500 mg Oral BID Marylyn Ishihara, Tyrone A, DO   500 mg at 05/17/22 2207   haloperidol (HALDOL) tablet 10 mg  10 mg Oral BID Winfred Leeds, Ashleynicole Mcclees, MD   10 mg at 05/18/22 1013   hydrOXYzine (ATARAX) tablet 25 mg  25 mg Oral TID PRN Ival Bible, MD       hydrOXYzine (ATARAX) tablet 25 mg  25 mg Oral Q6H PRN Ival Bible, MD   25 mg at 05/17/22 2033   ibuprofen (ADVIL) tablet 400 mg  400 mg Oral Q6H PRN Ival Bible, MD   400 mg at 05/17/22 1212   loperamide (IMODIUM) capsule 2-4 mg  2-4 mg Oral PRN Ival Bible, MD       LORazepam (ATIVAN) tablet 1 mg  1 mg Oral Q6H PRN Ival Bible, MD   1 mg at 05/17/22 1213   LORazepam (ATIVAN) tablet 2 mg  2 mg Oral QID Shepard Keltz, MD   2 mg at 05/18/22 1012   magnesium hydroxide (MILK OF MAGNESIA) suspension 30 mL  30 mL Oral Daily PRN Ival Bible, MD       metoprolol succinate (TOPROL-XL) 24 hr tablet 50 mg  50 mg Oral QHS Ernie Hew  S, MD   50 mg at 05/17/22 2032   multivitamin with minerals tablet 1 tablet  1 tablet Oral Daily Ival Bible, MD   1 tablet at 05/18/22 1012   ondansetron (ZOFRAN-ODT) disintegrating tablet 4 mg  4 mg Oral Q6H PRN Ival Bible, MD       pantoprazole (PROTONIX) EC tablet 40 mg  40 mg Oral Daily Ival Bible, MD   40 mg at 05/18/22 1012   SUMAtriptan (IMITREX) tablet 50 mg  50 mg Oral Q2H PRN Ival Bible, MD       thiamine (B-1) injection 100 mg  100 mg Intramuscular Once Ival Bible, MD       thiamine tablet 100 mg  100 mg Oral Daily Ival Bible, MD   100 mg at 05/18/22 1014   traZODone (DESYREL) tablet 100 mg  100 mg Oral QHS PRN Ival Bible, MD   100 mg at 05/18/22 0002    Lab Results:  Results for orders placed or performed during the hospital encounter of 05/16/22 (from the past 48 hour(s))  Comprehensive metabolic panel     Status: Abnormal   Collection Time: 05/18/22  5:20 AM  Result Value Ref  Range   Sodium 138 135 - 145 mmol/L   Potassium 3.4 (L) 3.5 - 5.1 mmol/L   Chloride 102 98 - 111 mmol/L   CO2 24 22 - 32 mmol/L   Glucose, Bld 108 (H) 70 - 99 mg/dL    Comment: Glucose reference range applies only to samples taken after fasting for at least 8 hours.   BUN 23 (H) 6 - 20 mg/dL   Creatinine, Ser 0.89 0.44 - 1.00 mg/dL   Calcium 9.8 8.9 - 10.3 mg/dL   Total Protein 7.1 6.5 - 8.1 g/dL   Albumin 4.3 3.5 - 5.0 g/dL   AST 157 (H) 15 - 41 U/L   ALT 102 (H) 0 - 44 U/L   Alkaline Phosphatase 107 38 - 126 U/L   Total Bilirubin 2.4 (H) 0.3 - 1.2 mg/dL   GFR, Estimated >60 >60 mL/min    Comment: (NOTE) Calculated using the CKD-EPI Creatinine Equation (2021)    Anion gap 12 5 - 15    Comment: Performed at [facility], [place] [address] [place], [place] [address]  Hepatic function panel     Status: Abnormal   Collection Time: 05/18/22  6:20 AM  Result Value Ref Range   Total Protein 7.1 6.5 - 8.1 g/dL   Albumin 4.3 3.5 - 5.0 g/dL   AST 157 (H) 15 - 41 U/L   ALT 102 (H) 0 - 44 U/L   Alkaline Phosphatase 107 38 - 126 U/L   Total Bilirubin 2.4 (H) 0.3 - 1.2 mg/dL   Bilirubin, Direct 0.6 (H) 0.0 - 0.2 mg/dL   Indirect Bilirubin 1.8 (H) 0.3 - 0.9 mg/dL    Comment: Performed at [facility], [place] [address], [place], [place] [address]  CBC with Differential/Platelet     Status: Abnormal   Collection Time: 05/18/22  6:20 AM  Result Value Ref Range   WBC 5.5 4.0 - 10.5 K/uL   RBC 3.43 (L) 3.87 - 5.11 MIL/uL   Hemoglobin 11.9 (L) 12.0 - 15.0 g/dL   HCT 35.1 (L) 36.0 - 46.0 %   MCV 102.3 (H) 80.0 - 100.0 fL   MCH 34.7 (H) 26.0 - 34.0 pg   MCHC 33.9 30.0 - 36.0 g/dL  RDW 15.8 (H) 11.5 - 15.5 %   Platelets 85 (L) 150 - 400 K/uL    Comment: SPECIMEN CHECKED FOR CLOTS Immature Platelet Fraction may be clinically indicated, consider ordering this additional test [id number] REPEATED TO VERIFY PLATELET COUNT CONFIRMED BY SMEAR    nRBC 0.0 0.0 -  0.2 %   Neutrophils Relative % 64 %   Neutro Abs 3.5 1.7 - 7.7 K/uL   Lymphocytes Relative 21 %   Lymphs Abs 1.2 0.7 - 4.0 K/uL   Monocytes Relative 14 %   Monocytes Absolute 0.8 0.1 - 1.0 K/uL   Eosinophils Relative 1 %   Eosinophils Absolute 0.1 0.0 - 0.5 K/uL   Basophils Relative 0 %   Basophils Absolute 0.0 0.0 - 0.1 K/uL   Immature Granulocytes 0 %   Abs Immature Granulocytes 0.02 0.00 - 0.07 K/uL    Comment: Performed at [facility], [place] [address], [place], [place] [address]  Glucose, capillary     Status: Abnormal   Collection Time: 05/18/22  6:21 AM  Result Value Ref Range   Glucose-Capillary 106 (H) 70 - 99 mg/dL    Comment: Glucose reference range applies only to samples taken after fasting for at least 8 hours.    Blood Alcohol level:  Lab Results  Component Value Date   ETH 309 (HH) 04/07/2022   ETH 262 (H) 38/09/1750    Metabolic Disorder Labs: No results found for: "HGBA1C", "MPG" No results found for: "PROLACTIN" Lab Results  Component Value Date   CHOL 217 (H) 05/15/2022   TRIG 111 05/15/2022   HDL 96 05/15/2022   CHOLHDL 2.3 05/15/2022   VLDL 22 05/15/2022   LDLCALC 99 05/15/2022    Physical Findings: AIMS:  , ,  ,  ,    CIWA:  CIWA-Ar Total: 14 COWS:     Musculoskeletal: Strength & Muscle Tone: within normal limits Gait & Station: unsteady Patient leans: N/A  Psychiatric Specialty Exam:  General Appearance: appears at stated age, disheveled, unkempt  Behavior: Asleep at this time  Psychomotor Activity: Sleep at this time Patient is asleep at time of my evaluation, unable to assess At the last evaluation patient has poor insight and judgment and has been responding to stimuli including auditory and visual hallucinations.   Assets  Assets:Communication Skills; Desire for Improvement; Social Support; Resilience; Housing    Physical Exam: Physical Exam ROS Blood pressure 109/71, pulse (!) 101, temperature 98.2 F  (36.8 C), temperature source Oral, resp. rate (!) 22, height '5\' 1"'  (1.549 m), weight 66.7 kg, last menstrual period 02/09/2014, SpO2 98 %. Body mass index is 27.78 kg/m.   Treatment Plan Summary:  ASSESSMENT:  Diagnoses / Active Problems: Principal Problem: Substance induced mood disorder (HCC) Diagnosis: Principal Problem:   Substance induced mood disorder ([place]) Probable delirium secondary to UTI Probable underlying MDD Dementia secondary to multiple factors including alcohol use, vascular  PLAN: Safety and Monitoring:             -- Patient was admitted voluntarily, will switch to involuntary admission to inpatient psychiatric unit for safety, stabilization and treatment             -- Daily contact with patient to assess and evaluate symptoms and progress in treatment             -- Patient's case to be discussed in multi-disciplinary team meeting             -- Observation Level : q15  minute checks             -- Vital signs:  q12 hours             -- Precautions: suicide, elopement, and assault   2. Medications:             Continue Ativan 2 mg 4 times daily for alcohol withdrawal, monitor efficacy and fever             Continue to monitor CIWA score            Continue Haldol 10 mg twice daily to address psychosis secondary to agitation            Continue Haldol Ativan Cogentin as needed for agitation             Continue Elavil for sleep/home medication --  The risks/benefits/side-effects/alternatives to this medication were discussed in detail with the patient and time was given for questions. The patient consents to medication trial.                -- Metabolic profile and EKG monitoring obtained while on an atypical antipsychotic (BMI: Lipid Panel: HbgA1c: QTc:)              Unable to obtain urine culture secondary to patient's confusion [employer], was started on Duricef antibiotic for UTI, resolved.             Continue home medications for and GERD  Lipitor was  discontinued secondary to elevated liver function                           3. Pertinent labs: elevated liver function test 05/24/2021 but going off since then.  Also low platelet count, had normal platelets in 2017 and has been declining gradually since November 2022, recently 72 in emergency room prior to admission.  UA suggestive of UTI, EKG in the emergency room QTc 415 Head CT indicated brain atrophy and small vessel disease in addition to scalp hematoma related to fall last night 6/13      Lab ordered: repeat CBC and liver function test tomorrow morning, continue to monitor   4.  Hospitalist consult was requested to assist with management of UTI, elevated liver enzymes, low platelet count, will follow.                 5. Group and Therapy: -- Encouraged patient to participate in unit milieu and in scheduled group therapies                           As patient improves cognitively we will address options for treatment for alcohol dependence including inpatient residential rehab versus outpatient rehab for alcohol dependence, limitations with dependence on insurance coverage as well as patient insight and willingness for treatment.                 -- Short Term Goals: Ability to identify changes in lifestyle to reduce recurrence of condition will improve, Ability to verbalize feelings will improve, Ability to disclose and discuss suicidal ideas, and Ability to demonstrate self-control will improve             -- Long Term Goals: Improvement in symptoms so as ready for discharge   6. Discharge Planning:              -- Social work and case management to assist with discharge  planning and identification of hospital follow-up needs prior to discharge             -- Estimated LOS: 5-7 days             -- Discharge Concerns: Need to establish a safety plan; Medication compliance and effectiveness             -- Discharge Goals: Return home with outpatient referrals for mental health follow-up  including medication management/psychotherapy     Total Time Spent in Direct Patient Care:  I personally spent 35 minutes on the unit in direct patient care. The direct patient care time included face-to-face time with the patient, reviewing the patient's chart, communicating with other professionals, and coordinating care. Greater than 50% of this time was spent in counseling or coordinating care with the patient regarding goals of hospitalization, psycho-education, and discharge planning needs.   Markala Sitts Winfred Leeds, MD 05/18/2022, 1:40 PM

## 2022-05-18 NOTE — Progress Notes (Signed)
NP was notified that patient fell on the unit.  Face-to-face assessment and evaluation of patient, she is combative,  agitated, and verbally agitated,  pupil equal and reactive to light,  no decrease LOC noted,  patient move all extremities.  Patient was able to stand with assistance from nursing staff and ambulate. according to nursing staff patient did hit her head when she fell.   NP tried to assess patient's head, however  patient would not allow NP to touch her head.  Pt stated " don't touch my F--ing head.  No emergent injury was noted that warrant sending pt to the ED.   Will initiate neuro checks and vital signs.  Because of patient agitation and unwillingness to cooperate with nursing staff it is advised that the patient be put one-on-one observation.   Will initiate neuro checks and vital sign as follows.  Q30 min x 2 Q1 hour x 2 Q4 x 1  Nursing staff advise to report any change to patient mentation.  And the need to set out to the hospital in change in mentation.

## 2022-05-18 NOTE — Progress Notes (Addendum)
Pt asleep in quiet room. Respirations noted and unlabored, appears to be in no physical distress. 1:1 observation maintained for safety with staff in attendance at all times. Support and encouragement provided to pt. Safety maintained.

## 2022-05-18 NOTE — ED Provider Notes (Signed)
[facility] DEPT Provider Note   CSN: [id number] Arrival date & time: 05/18/22  0450     History  Chief Complaint  Patient presents with   [name] is a 61 y.o. 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  Patient continues to be disoriented and confused.   Per nurse practitioner Evette Georges patient had an unwitnessed fall at [place] with subsequent hematoma over the right side of the face prompting transfer to [facility] long emergency department for CT imaging and medical clearance to return back to behavioral health.  I have personally reviewed this patient's medical records.  In addition to the above listed history she has history of anxiety, sleep disorder, PTSD, degenerative disc disease.  She is not anticoagulated from my review of her chart review.  HPI     Home Medications Prior to Admission medications   Medication Sig Start Date End Date Taking? Authorizing Provider  amitriptyline (ELAVIL) 25 MG tablet Take 50 mg by mouth at bedtime. 09/17/21   [provider]  atorvastatin (LIPITOR) 40 MG tablet Take 40 mg by mouth at bedtime. 08/05/21   [provider]  esomeprazole (NEXIUM) 20 MG capsule Take 20 mg by mouth daily as needed (acid reflux/indigestion).    [provider]  estradiol (VIVELLE-DOT) 0.0375 MG/24HR Place 1 patch onto the skin 2 (two) times a week. Tuesday and Thursday 07/28/21   [provider]  LORazepam (ATIVAN) 1 MG tablet Take 1 tablet (1 mg total) by mouth daily. Continue taper 6/12 take 1 mg at 1600 and 2200 6/13 take 1 mg at 1000 and 2200 6/14 take 1 mg at 1000 6/15 take 1 mg at 1000 05/19/22   Ival Bible, MD  metoprolol succinate  (TOPROL-XL) 50 MG 24 hr tablet Take 50 mg by mouth at bedtime. 09/16/21   [provider]  rizatriptan (MAXALT) 5 MG tablet Take 10 mg by mouth See admin instructions. Take 2 tablets (10 mg) by mouth at onset of migraine, may repeat in 2 hours if still needed 11/30/21   [provider]      Allergies    Patient has no known allergies.    Review of Systems   Review of Systems  Unable to perform ROS: Psychiatric disorder    Physical Exam Updated Vital Signs BP 118/77   Pulse 93   Temp 98.2 F (36.8 C) (Oral)   Resp (!) 22   Ht '5\' 1"'$  (1.549 m)   Wt 66.7 kg   LMP 02/09/2014 Comment: AUB  SpO2 95%   BMI 27.78 kg/m  Physical Exam Vitals and nursing note reviewed.  Constitutional:      Appearance: She is not ill-appearing or toxic-appearing.  HENT:     Head: Normocephalic and atraumatic. No raccoon eyes or Battle's sign.      Right Ear: External ear normal.     Left Ear: External ear normal.     Nose: Nose normal.     Mouth/Throat:     Mouth: Mucous membranes are moist.     Pharynx: Oropharynx is clear. Uvula midline. No oropharyngeal exudate or posterior oropharyngeal erythema.  Eyes:     General: Lids are normal. Vision grossly intact.        Right eye: No  discharge.        Left eye: No discharge.     Extraocular Movements: Extraocular movements intact.     Conjunctiva/sclera: Conjunctivae normal.     Pupils: Pupils are equal, round, and reactive to light.  Neck:     Trachea: Trachea and phonation normal.  Cardiovascular:     Rate and Rhythm: Normal rate and regular rhythm.     Pulses: Normal pulses.     Heart sounds: Normal heart sounds. No murmur heard. Pulmonary:     Effort: Pulmonary effort is normal. No tachypnea, bradypnea, accessory muscle usage, prolonged expiration or respiratory distress.     Breath sounds: Normal breath sounds. No wheezing or rales.  Chest:     Chest wall: No mass, lacerations, deformity, swelling, tenderness, crepitus or  edema.  Abdominal:     General: Bowel sounds are normal. There is no distension.     Palpations: Abdomen is soft.     Tenderness: There is no abdominal tenderness. There is no guarding or rebound.  Musculoskeletal:        General: No deformity.     Cervical back: Normal range of motion and neck supple.     Right lower leg: No edema.     Left lower leg: No edema.  Lymphadenopathy:     Cervical: No cervical adenopathy.  Skin:    General: Skin is warm and dry.     Capillary Refill: Capillary refill takes less than 2 seconds.     Findings: Bruising present.  Neurological:     General: No focal deficit present.     Mental Status: She is alert. Mental status is at baseline. She is disoriented and confused.     GCS: GCS eye subscore is 4. GCS verbal subscore is 3. GCS motor subscore is 5.  Psychiatric:        Mood and Affect: Mood normal.     ED Results / Procedures / Treatments   Labs (all labs ordered are listed, but only abnormal results are displayed) Labs Reviewed  COMPREHENSIVE METABOLIC PANEL - Abnormal; Notable for the following components:      Result Value   Potassium 3.4 (*)    Glucose, Bld 108 (*)    BUN 23 (*)    AST 157 (*)    ALT 102 (*)    Total Bilirubin 2.4 (*)    All other components within normal limits  CBC WITH DIFFERENTIAL/PLATELET - Abnormal; Notable for the following components:   RBC 3.43 (*)    Hemoglobin 11.9 (*)    HCT 35.1 (*)    MCV 102.3 (*)    MCH 34.7 (*)    RDW 15.8 (*)    Platelets 85 (*)    All other components within normal limits  GLUCOSE, CAPILLARY - Abnormal; Notable for the following components:   Glucose-Capillary 106 (*)    All other components within normal limits  URINE CULTURE  AMMONIA  PROTIME-INR  PATHOLOGIST SMEAR REVIEW  HEPATIC FUNCTION PANEL  CBG MONITORING, ED    EKG None  Radiology CT HEAD WO CONTRAST (5MM)  Result Date: 05/18/2022 CLINICAL DATA:  Head and neck trauma, frequent falls. History of pituitary  tumor resection. EXAM: CT HEAD WITHOUT CONTRAST CT CERVICAL SPINE WITHOUT CONTRAST TECHNIQUE: Multidetector CT imaging of the head and cervical spine was performed following the standard protocol without intravenous contrast. Multiplanar CT image reconstructions of the cervical spine were also generated. RADIATION DOSE REDUCTION: This exam was performed according to the departmental  dose-optimization program which includes automated exposure control, adjustment of the mA and/or kV according to patient size and/or use of iterative reconstruction technique. COMPARISON:  Head CT of 10/07/2021 cervical spine CT of 09/03/2016 FINDINGS: CT HEAD FINDINGS Brain: There is mild cerebral atrophy, small-vessel disease and atrophic ventriculomegaly without midline shift. Cerebellum and brainstem are unremarkable. No asymmetry is seen concerning for an acute infarct, hemorrhage or mass. The basal cisterns are clear. Vascular: No hyperdense vessel or unexpected calcification. Skull: Scalp hematoma right forehead area. The calvarium, skull base and orbits are intact. Sinuses/Orbits: No acute abnormality. There are old lens extractions. Surgical fat packing in the sphenoid sinus is seen consistent with prior transsphenoidal pituitary surgery. Other: None.  No mastoid effusion. CT CERVICAL SPINE FINDINGS Alignment: There is a stable 2 mm anterolisthesis at C3-4 likely degenerative. This is unchanged. No traumatic or further listhesis seen. Mild dextroscoliosis. There is narrowing and spurring of the anterior atlantodental joint. Skull base and vertebrae: No acute fracture. No primary bone lesion or focal pathologic process. Soft tissues and spinal canal: No prevertebral fluid or swelling. No visible canal hematoma. There is a low-density mass in the inferior pole left lobe of the thyroid gland today measuring 2.3 cm, previously 1.6 cm. Ultrasound follow-up recommended. Disc levels: The discs are degenerated and there are  bidirectional osteophytes from C3-4 through C7-T1. There is only slight disc space loss with small anterior osteophytes at C3-4. C2-3 is normal in height. Posterior disc osteophyte complexes from C4-5 through C7-T1 variably encroaching on the thecal sac, with mild spinal canal stenosis and mild deformity along the ventral cord surface due to disc osteophyte complex encroachment. No frank cord compression is seen. There is mild foraminal stenosis at C5-6 and C6-7 but no critical foraminal stenosis. Upper chest: Negative. Other: None. IMPRESSION: 1. Right forehead scalp hematoma. No acute intracranial CT findings, depressed skull fractures or further interval changes. 2. Stable atrophy and small-vessel disease. 3. Degenerative changes of the cervical spine and mild dextroscoliosis without evidence of fractures. 4. 2.3 cm left thyroid mass which was previously 1.6 cm. Thyroid ultrasound recommended. Electronically Signed   By: Telford Nab M.D.   On: 05/18/2022 07:03   CT Cervical Spine Wo Contrast  Result Date: 05/18/2022 CLINICAL DATA:  Head and neck trauma, frequent falls. History of pituitary tumor resection. EXAM: CT HEAD WITHOUT CONTRAST CT CERVICAL SPINE WITHOUT CONTRAST TECHNIQUE: Multidetector CT imaging of the head and cervical spine was performed following the standard protocol without intravenous contrast. Multiplanar CT image reconstructions of the cervical spine were also generated. RADIATION DOSE REDUCTION: This exam was performed according to the departmental dose-optimization program which includes automated exposure control, adjustment of the mA and/or kV according to patient size and/or use of iterative reconstruction technique. COMPARISON:  Head CT of 10/07/2021 cervical spine CT of 09/03/2016 FINDINGS: CT HEAD FINDINGS Brain: There is mild cerebral atrophy, small-vessel disease and atrophic ventriculomegaly without midline shift. Cerebellum and brainstem are unremarkable. No asymmetry is seen  concerning for an acute infarct, hemorrhage or mass. The basal cisterns are clear. Vascular: No hyperdense vessel or unexpected calcification. Skull: Scalp hematoma right forehead area. The calvarium, skull base and orbits are intact. Sinuses/Orbits: No acute abnormality. There are old lens extractions. Surgical fat packing in the sphenoid sinus is seen consistent with prior transsphenoidal pituitary surgery. Other: None.  No mastoid effusion. CT CERVICAL SPINE FINDINGS Alignment: There is a stable 2 mm anterolisthesis at C3-4 likely degenerative. This is unchanged. No traumatic or further listhesis  seen. Mild dextroscoliosis. There is narrowing and spurring of the anterior atlantodental joint. Skull base and vertebrae: No acute fracture. No primary bone lesion or focal pathologic process. Soft tissues and spinal canal: No prevertebral fluid or swelling. No visible canal hematoma. There is a low-density mass in the inferior pole left lobe of the thyroid gland today measuring 2.3 cm, previously 1.6 cm. Ultrasound follow-up recommended. Disc levels: The discs are degenerated and there are bidirectional osteophytes from C3-4 through C7-T1. There is only slight disc space loss with small anterior osteophytes at C3-4. C2-3 is normal in height. Posterior disc osteophyte complexes from C4-5 through C7-T1 variably encroaching on the thecal sac, with mild spinal canal stenosis and mild deformity along the ventral cord surface due to disc osteophyte complex encroachment. No frank cord compression is seen. There is mild foraminal stenosis at C5-6 and C6-7 but no critical foraminal stenosis. Upper chest: Negative. Other: None. IMPRESSION: 1. Right forehead scalp hematoma. No acute intracranial CT findings, depressed skull fractures or further interval changes. 2. Stable atrophy and small-vessel disease. 3. Degenerative changes of the cervical spine and mild dextroscoliosis without evidence of fractures. 4. 2.3 cm left thyroid  mass which was previously 1.6 cm. Thyroid ultrasound recommended. Electronically Signed   By: Telford Nab M.D.   On: 05/18/2022 07:03   US Abdomen Limited RUQ (LIVER/GB)  Result Date: 05/17/2022 CLINICAL DATA:  Elevated LFTs. EXAM: ULTRASOUND ABDOMEN LIMITED RIGHT UPPER QUADRANT COMPARISON:  CT abdomen and pelvis 09/03/2016. FINDINGS: Gallbladder: No gallstones or wall thickening visualized. No sonographic Murphy sign noted by sonographer. Common bile duct: Diameter: Measures 11 mm distally. Measures 5.6 mm proximally. Appearance is similar to the prior CT in 2017. Liver: No focal lesion identified. Increase in parenchymal echogenicity. Portal vein is patent on color Doppler imaging with normal direction of blood flow towards the liver. Other: None. IMPRESSION: 1. The distal common bile duct is dilated, but mid common bile duct is normal in size. This appearance is unchanged compared to 2017 CT and likely within normal limits for this patient. Recommend correlation with lab values to exclude distal biliary obstruction. 2. No cholelithiasis. 3. Echogenic liver likely related to fatty infiltration. Electronically Signed   By: Ronney Asters M.D.   On: 05/17/2022 23:09    Procedures Procedures    Medications Ordered in ED Medications  alum & mag hydroxide-simeth (MAALOX/MYLANTA) 200-200-20 MG/5ML suspension 30 mL (has no administration in time range)  magnesium hydroxide (MILK OF MAGNESIA) suspension 30 mL (has no administration in time range)  hydrOXYzine (ATARAX) tablet 25 mg (has no administration in time range)  traZODone (DESYREL) tablet 100 mg (100 mg Oral Given 05/18/22 0002)  ibuprofen (ADVIL) tablet 400 mg (400 mg Oral Given 05/17/22 1212)  pantoprazole (PROTONIX) EC tablet 40 mg (40 mg Oral Given 05/17/22 0945)  SUMAtriptan (IMITREX) tablet 50 mg (has no administration in time range)  metoprolol succinate (TOPROL-XL) 24 hr tablet 50 mg (50 mg Oral Given 05/17/22 2032)  amitriptyline (ELAVIL)  tablet 25 mg (25 mg Oral Given 05/17/22 2033)  thiamine (B-1) injection 100 mg (100 mg Intramuscular Not Given 05/16/22 1815)  thiamine tablet 100 mg (100 mg Oral Given 05/17/22 0945)  multivitamin with minerals tablet 1 tablet (1 tablet Oral Given 05/17/22 0946)  LORazepam (ATIVAN) tablet 1 mg (1 mg Oral Given 05/17/22 1213)  hydrOXYzine (ATARAX) tablet 25 mg (25 mg Oral Given 05/17/22 2033)  loperamide (IMODIUM) capsule 2-4 mg (has no administration in time range)  ondansetron (ZOFRAN-ODT) disintegrating tablet  4 mg (has no administration in time range)  LORazepam (ATIVAN) injection 1 mg (1 mg Intramuscular Not Given 05/16/22 1854)  haloperidol (HALDOL) tablet 10 mg (10 mg Oral Given 05/17/22 2033)  haloperidol (HALDOL) tablet 10 mg (10 mg Oral Given 05/18/22 0002)    And  LORazepam (ATIVAN) tablet 2 mg (2 mg Oral Given 05/18/22 0001)    And  benztropine (COGENTIN) tablet 1 mg (1 mg Oral Given 05/18/22 0003)  cefadroxil (DURICEF) capsule 500 mg (500 mg Oral Given 05/17/22 2207)  LORazepam (ATIVAN) tablet 2 mg (2 mg Oral Given 05/17/22 2034)  ziprasidone (GEODON) injection 20 mg (20 mg Intramuscular Given 05/16/22 1854)  LORazepam (ATIVAN) 2 MG/ML injection (  Given 05/16/22 1853)  ziprasidone (GEODON) injection 10 mg (10 mg Intramuscular Given 05/17/22 0155)  LORazepam (ATIVAN) tablet 2 mg (2 mg Oral Given 05/17/22 0944)    Or  LORazepam (ATIVAN) injection 2 mg ( Intramuscular See Alternative 05/17/22 0944)  haloperidol (HALDOL) tablet 5 mg (5 mg Oral Given 05/17/22 0949)    Or  haloperidol lactate (HALDOL) injection 5 mg ( Intramuscular See Alternative 05/17/22 0949)  haloperidol (HALDOL) 5 MG tablet (0 mg  Duplicate 03/20/37 4536)  diazepam (VALIUM) tablet 5 mg (5 mg Oral Given 05/17/22 4680)    ED Course/ Medical Decision Making/ A&P                           Medical Decision Making  61 year old female presents on transfer from behavioral hospital for CT imaging after unwitnessed fall with hematoma  over the right face.  There is reportedly no LOC.  Patient remains disoriented throughout my evaluation believing she is on an airplane at this time, difficult to direct though according to the behavioral health technicians accompanying her to our emergency department tonight this is her baseline since the beginning of her admission to the behavioral hospital on 05/15/2022.   Amount and/or Complexity of Data Reviewed Labs:     Details: Laboratory studies ordered by psychiatric team pending at time of shift change. Radiology: ordered.    Details: CT imaging pending at time of shift change.     Care of this patient signed out to oncoming ED provider Marga Hoots, PA-C at time of shift change.  Patient pending CT imaging and laboratory studies for medical clearance to return back to the behavioral health hospital.  Disposition pending completion of work-up.  No significant metabolic derangements on laboratory studies throughout psychiatric admission reviewed by this provider. This chart was dictated using voice recognition software, Dragon. Despite the best efforts of this provider to proofread and correct errors, errors may still occur which can change documentation meaning.  Final Clinical Impression(s) / ED Diagnoses Final diagnoses:  None    Rx / DC Orders ED Discharge Orders     None         Aura Dials 05/18/22 0709    Quintella Reichert, MD 05/18/22 1920

## 2022-05-18 NOTE — ED Notes (Signed)
Report attempted via phone call at this time, spoke with Mia. Exchanged information to return call back.

## 2022-05-18 NOTE — Progress Notes (Signed)
Please have the provider call and speak directly with Dr Winfred Leeds before sending back to [facility]

## 2022-05-18 NOTE — Progress Notes (Signed)
Pt refused 223 vitals

## 2022-05-18 NOTE — Progress Notes (Signed)
   05/18/22 2000  Psych Admission Type (Psych Patients Only)  Admission Status Voluntary  Psychosocial Assessment  Patient Complaints Substance abuse  Eye Contact Fair  Facial Expression Anxious  Affect Anxious;Preoccupied;Labile  Speech Pressured;Rapid  Interaction Intrusive  Motor Activity Restless;Fidgety  Appearance/Hygiene Disheveled  Behavior Characteristics Fidgety  Mood Labile;Suspicious;Preoccupied  Aggressive Behavior  Effect No apparent injury  Thought Process  Coherency Circumstantial  Content WDL  Delusions WDL  Perception WDL  Hallucination None reported or observed  Judgment Impaired  Confusion Mild  Danger to Self  Current suicidal ideation? Denies  Danger to Others  Danger to Others None reported or observed

## 2022-05-18 NOTE — ED Notes (Signed)
Patient transported to CT 

## 2022-05-18 NOTE — BH IP Treatment Plan (Signed)
Interdisciplinary Treatment and Diagnostic Plan Update  05/18/2022 Time of Session: 10:00am  [name] MRN: [id number]  Principal Diagnosis: Substance induced mood disorder ([place])  Secondary Diagnoses: Principal Problem:   Substance induced mood disorder ([place])   Current Medications:  Current Facility-Administered Medications  Medication Dose Route Frequency Provider Last Rate Last Admin   alum & mag hydroxide-simeth (MAALOX/MYLANTA) 035-009-38 MG/5ML suspension 30 mL  30 mL Oral Q4H PRN Ival Bible, MD       amitriptyline (ELAVIL) tablet 25 mg  25 mg Oral QHS Ival Bible, MD   25 mg at 05/17/22 2033   haloperidol (HALDOL) tablet 10 mg  10 mg Oral Q6H PRN Winfred Leeds, Nadir, MD   10 mg at 05/18/22 0002   And   LORazepam (ATIVAN) tablet 2 mg  2 mg Oral Q6H PRN Winfred Leeds, Nadir, MD   2 mg at 05/18/22 0001   And   benztropine (COGENTIN) tablet 1 mg  1 mg Oral Q6H PRN Winfred Leeds, Nadir, MD   1 mg at 05/18/22 1012   cefadroxil (DURICEF) capsule 500 mg  500 mg Oral BID Marylyn Ishihara, Tyrone A, DO   500 mg at 05/17/22 2207   haloperidol (HALDOL) tablet 10 mg  10 mg Oral BID Winfred Leeds, Nadir, MD   10 mg at 05/18/22 1013   hydrOXYzine (ATARAX) tablet 25 mg  25 mg Oral TID PRN Ival Bible, MD       hydrOXYzine (ATARAX) tablet 25 mg  25 mg Oral Q6H PRN Ival Bible, MD   25 mg at 05/17/22 2033   ibuprofen (ADVIL) tablet 400 mg  400 mg Oral Q6H PRN Ival Bible, MD   400 mg at 05/17/22 1212   loperamide (IMODIUM) capsule 2-4 mg  2-4 mg Oral PRN Ival Bible, MD       LORazepam (ATIVAN) tablet 1 mg  1 mg Oral Q6H PRN Ival Bible, MD   1 mg at 05/17/22 1213   LORazepam (ATIVAN) tablet 2 mg  2 mg Oral QID Attiah, Nadir, MD   2 mg at 05/18/22 1012   magnesium hydroxide (MILK OF MAGNESIA) suspension 30 mL  30 mL Oral Daily PRN Ival Bible, MD       metoprolol succinate (TOPROL-XL) 24 hr tablet 50 mg  50 mg Oral QHS Ival Bible, MD   50 mg at  05/17/22 2032   multivitamin with minerals tablet 1 tablet  1 tablet Oral Daily Ival Bible, MD   1 tablet at 05/18/22 1012   ondansetron (ZOFRAN-ODT) disintegrating tablet 4 mg  4 mg Oral Q6H PRN Ival Bible, MD       pantoprazole (PROTONIX) EC tablet 40 mg  40 mg Oral Daily Ival Bible, MD   40 mg at 05/18/22 1012   SUMAtriptan (IMITREX) tablet 50 mg  50 mg Oral Q2H PRN Ival Bible, MD       thiamine (B-1) injection 100 mg  100 mg Intramuscular Once Ival Bible, MD       thiamine tablet 100 mg  100 mg Oral Daily Ival Bible, MD   100 mg at 05/18/22 1014   traZODone (DESYREL) tablet 100 mg  100 mg Oral QHS PRN Ival Bible, MD   100 mg at 05/18/22 0002   PTA Medications: Medications Prior to Admission  Medication Sig Dispense Refill Last Dose   amitriptyline (ELAVIL) 25 MG tablet Take 50 mg by mouth at bedtime.  atorvastatin (LIPITOR) 40 MG tablet Take 40 mg by mouth at bedtime.      esomeprazole (NEXIUM) 20 MG capsule Take 20 mg by mouth daily as needed (acid reflux/indigestion).      estradiol (VIVELLE-DOT) 0.0375 MG/24HR Place 1 patch onto the skin 2 (two) times a week. Tuesday and Thursday      [START ON 05/19/2022] LORazepam (ATIVAN) 1 MG tablet Take 1 tablet (1 mg total) by mouth daily. Continue taper 6/12 take 1 mg at 1600 and 2200 6/13 take 1 mg at 1000 and 2200 6/14 take 1 mg at 1000 6/15 take 1 mg at 1000 30 tablet 0    metoprolol succinate (TOPROL-XL) 50 MG 24 hr tablet Take 50 mg by mouth at bedtime.      rizatriptan (MAXALT) 5 MG tablet Take 10 mg by mouth See admin instructions. Take 2 tablets (10 mg) by mouth at onset of migraine, may repeat in 2 hours if still needed       Patient Stressors: Health problems   Marital or family conflict   Medication change or noncompliance   Substance abuse    Patient Strengths: Ability for insight  [employer] forces logistics/support/administrative officer  Motivation for treatment/growth  Supportive  family/friends   Treatment Modalities: Medication Management, Group therapy, Case management,  1 to 1 session with clinician, Psychoeducation, Recreational therapy.   Physician Treatment Plan for Primary Diagnosis: Substance induced mood disorder ([place]) Long Term Goal(s): Improvement in symptoms so as ready for discharge   Short Term Goals: Ability to identify changes in lifestyle to reduce recurrence of condition will improve Ability to verbalize feelings will improve Ability to disclose and discuss suicidal ideas Ability to demonstrate self-control will improve  Medication Management: Evaluate patient's response, side effects, and tolerance of medication regimen.  Therapeutic Interventions: 1 to 1 sessions, Unit Group sessions and Medication administration.  Evaluation of Outcomes: Not Met  Physician Treatment Plan for Secondary Diagnosis: Principal Problem:   Substance induced mood disorder ([place])  Long Term Goal(s): Improvement in symptoms so as ready for discharge   Short Term Goals: Ability to identify changes in lifestyle to reduce recurrence of condition will improve Ability to verbalize feelings will improve Ability to disclose and discuss suicidal ideas Ability to demonstrate self-control will improve     Medication Management: Evaluate patient's response, side effects, and tolerance of medication regimen.  Therapeutic Interventions: 1 to 1 sessions, Unit Group sessions and Medication administration.  Evaluation of Outcomes: Not Met   RN Treatment Plan for Primary Diagnosis: Substance induced mood disorder ([place]) Long Term Goal(s): Knowledge of disease and therapeutic regimen to maintain health will improve  Short Term Goals: Ability to remain free from injury will improve, Ability to participate in decision making will improve, Ability to verbalize feelings will improve, Ability to disclose and discuss suicidal ideas, and Ability to identify and develop effective  coping behaviors will improve  Medication Management: RN will administer medications as ordered by provider, will assess and evaluate patient's response and provide education to patient for prescribed medication. RN will report any adverse and/or side effects to prescribing provider.  Therapeutic Interventions: 1 on 1 counseling sessions, Psychoeducation, Medication administration, Evaluate responses to treatment, Monitor vital signs and CBGs as ordered, Perform/monitor CIWA, COWS, AIMS and Fall Risk screenings as ordered, Perform wound care treatments as ordered.  Evaluation of Outcomes: Not Met   LCSW Treatment Plan for Primary Diagnosis: Substance induced mood disorder ([place]) Long Term Goal(s): Safe transition to appropriate next level of care at  discharge, Engage patient in therapeutic group addressing interpersonal concerns.  Short Term Goals: Engage patient in aftercare planning with referrals and resources, Increase social support, Increase emotional regulation, Facilitate acceptance of mental health diagnosis and concerns, Identify triggers associated with mental health/substance abuse issues, and Increase skills for wellness and recovery  Therapeutic Interventions: Assess for all discharge needs, 1 to 1 time with Social worker, Explore available resources and support systems, Assess for adequacy in community support network, Educate family and significant other(s) on suicide prevention, Complete Psychosocial Assessment, Interpersonal group therapy.  Evaluation of Outcomes: Not Met   Progress in Treatment: Attending groups: No. Participating in groups: No. Taking medication as prescribed: Yes. Toleration medication: Yes. Family/Significant other contact made: Yes, individual(s) contacted:  If consents are provided  Patient understands diagnosis: No. Discussing patient identified problems/goals with staff: Yes. Medical problems stabilized or resolved: Yes. Denies suicidal/homicidal  ideation: Yes. Issues/concerns per patient self-inventory: No.   New problem(s) identified: No, Describe:  None   New Short Term/Long Term Goal(s): medication stabilization, elimination of SI thoughts, development of comprehensive mental wellness plan.   Patient Goals:  Did not attend   Discharge Plan or Barriers: Patient recently admitted. CSW will continue to follow and assess for appropriate referrals and possible discharge planning.   Reason for Continuation of Hospitalization: Aggression Anxiety Depression Medication stabilization Withdrawal symptoms  Estimated Length of Stay: 3 to 7 days   Last Bode Suicide Severity Risk Score: Flowsheet Row ED to Hosp-Admission (Current) from 05/16/2022 in [facility] ED from [facility]/15/2022 in [facility] ED from 04/07/2022 in [place] DEPT  C-SSRS RISK CATEGORY Error: Q3, 4, or 5 should not be populated when Q2 is No No Risk Low Risk       Last PHQ 2/9 Scores:    05/15/2022    1:39 PM  Depression screen PHQ 2/9  Decreased Interest 2  Down, Depressed, Hopeless 2  PHQ - 2 Score 4  Altered sleeping 3  Tired, decreased energy 2  Change in appetite 0  Feeling bad or failure about yourself  2  Trouble concentrating 2  Moving slowly or fidgety/restless 2  Suicidal thoughts 3  PHQ-9 Score 18  Difficult doing work/chores Somewhat difficult    Scribe for Treatment Team: Darleen Crocker, Latanya Presser 05/18/2022 3:01 PM

## 2022-05-18 NOTE — ED Notes (Signed)
Danielle RN [facility] aware of delay in sending pt back to [facility] since she is cleared to return. She was told for our EDP to call Psychiatrist first. Upon speaking with Dr Melina Copa, he is in agreement to speak with them. No number was provided. Writer spoke with Tonette Bihari who will obtain information and update Dr Melina Copa. Danielle RN (pts nurse) has been updated. At this time waiting for more information.

## 2022-05-18 NOTE — Group Note (Signed)
LCSW Group Therapy Note   Group Date: 05/18/2022 Start Time: 1300 End Time: 1400   Type of Therapy and Topic:  Group Therapy: Problem Solving   Participation Level:  Did not attend  Description of Group:  Patients identified different skills needed to problem solve.  Patients were able to identify a problem they were having and identify steps into solving that problem including 1. Identify the problem, 2. Generating possible solutions, 3. Evaluating alternatives, 4. Decide on a solution, 5 Implement the solution and 6 Evaluate the outcome.  Patients demonstrated understanding by participating in discussion and assisting peers with solving problems.    Therapeutic Goals:  1. Identify problem solving skills 2. Demonstrate understanding by identifying problem and going through steps of problem solving.     Summary of Patient Progress:   Did not attend   Ventura Bruns Kionte Baumgardner, LCSW 05/18/2022  1:48 PM

## 2022-05-18 NOTE — Progress Notes (Signed)
Recreation Therapy Notes  INPATIENT RECREATION THERAPY ASSESSMENT  Patient Details Name: [name] MRN: [id number] DOB: 1961/09/10 Today's Date: 05/18/2022       Information Obtained From: Chart Review  Able to Participate in Assessment/Interview: No (Pt is too acute to participate in assessment process.)  Patient Presentation:  (Delusional)  Reason for Admission (Per Patient): Other (Comments) (Per chart: anxiety, depression, alcohol abuse)  Patient Stressors:  (UTA)  Coping Skills:    (UTA)  Leisure Interests (2+):   (UTA)  Frequency of Recreation/Participation:  (UTA)  Awareness of Community Resources:   (UTA)  Expressed Interest in [place]:  ([place])  County of Residence:  UTA  Patient Main Form of Transportation:  (UTA)  Patient Strengths:  UTA  Patient Identified Areas of Improvement:  UTA  Patient Goal for Hospitalization:  UTA  Staff Intervention Plan: Group Attendance, Collaborate with Interdisciplinary Treatment Team  Consent to Intern Participation: N/A   Victorino Sparrow, Vickki Muff, Bailie Christenbury A 05/18/2022, 2:42 PM

## 2022-05-18 NOTE — Progress Notes (Signed)
Nursing 1:1 note D:Pt observed sleeping in bed with eyes closed. RR even and unlabored. No distress noted.Pt used the bathroom @ 2100  A: 1:1 observation continues for safety  R: pt remains safe

## 2022-05-18 NOTE — Progress Notes (Signed)
Pt refused 0253 vitals "Get the F@@@ out my house"

## 2022-05-18 NOTE — ED Notes (Signed)
Report to [facility] attempted via phone call at this time.

## 2022-05-18 NOTE — Progress Notes (Signed)
Pt continues to yell and scream on the unit waking up some of her peers. Pt continues to be delusional and argumentative

## 2022-05-18 NOTE — Progress Notes (Signed)
Pt returned to unit at approximately 1115 via wheelchair accompanied by 4 staff due to pt's unsteady gait and her being combative towards others. Continues to hallucinate, she's very paranoid, hypervigilant & suspicious of others. Nonsensical in her responses to assessment questions. Drank 240 ml of ice water on arrival to unit. Declined vitals X 3 ripping BP cuff off her arm and lunch when offered as well; yelling and walking out of her room. 1:1 observation maintained for safety with staff in attendance at all times. Support, reassurance and encouragement provided. Pt awake in quiet room at this time.

## 2022-05-18 NOTE — ED Notes (Signed)
Transportation call returned, return given to driver.

## 2022-05-18 NOTE — ED Triage Notes (Addendum)
Unwitnessed fall, staff found pt walking around in room, hematoma to right forehead.

## 2022-05-18 NOTE — Progress Notes (Signed)
Pt woke up from her sleep, pt appeared pleasant safety precautions explained to pt with understanding. Pt informed that a staff member will be walking with her using a gait belt if she has to ambulate every time. Pt given PRN Trazodone and Ibuprofen per MAR with HS medications at pt request.

## 2022-05-18 NOTE — Progress Notes (Signed)
Pt continues to be unsteady, pt trying to stand up and very unsteady on her feet. Pt appears to have knot on her forehead and blood on her lip from unknown origins . Pt continues to resist safety measures and pt fell again, but landed on the mattress. Per NP Carloyn Manner pt needs to go out and be evaluated. Gait belt placed on pt for ambulating due to pt non-compliance with safety measures.

## 2022-05-18 NOTE — Progress Notes (Signed)
NP Regulatory affairs officer), was notified that patient has a raised area to her forehead, and reddened area to her lip.  Patient continued to be combative and agitated, NP notified [facility] and spoke with Dr. Ayesha Rumpf who agreed to take patient so patient can get medical evaluation and clearance.  Face-to-face assessment of patient, patient is alert, very agitated, very combative patient is unable to stand still keep pacing the room.

## 2022-05-18 NOTE — ED Provider Notes (Signed)
61yo female sent by [facility] for unwitnessed fall with hematoma to forehead. Not on thinners.  Sitter at bedside reports baseline mental status. If CT normal, can go back to [facility]. Physical Exam  BP 109/71   Pulse (!) 101   Temp 98.2 F (36.8 C) (Oral)   Resp (!) 22   Ht '5\' 1"'$  (1.549 m)   Wt 66.7 kg   LMP 02/09/2014 Comment: AUB  SpO2 98%   BMI 27.78 kg/m   Physical Exam  Procedures  Procedures  ED Course / MDM    Medical Decision Making Amount and/or Complexity of Data Reviewed Radiology: ordered.   CT head and C-spine negative for acute findings.  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  Patient was discussed with Dr. Nance Pew with behavioral health who is concerned that patient is not eating or drinking.  Discussed with caretaker at bedside who notes patient did eat a piece of bacon and [place] toast today as well as drink some juice.  Sitter at bedside notes patient was ambulatory to the bathroom independently with a steady gait and did void.  On recheck, patient is in the room with the nurse, currently taking her pills.  Dr. Reginal Lutes accepts patient back to behavioral health for further management.       Tacy Learn, PA-C 05/18/22 1223    Hayden Rasmussen, MD 05/18/22 [phone]

## 2022-05-18 NOTE — Progress Notes (Signed)
TRIAD  follow-up progress note:  Consult deceased with UTI, transaminases and thrombocytopenia  Patient denies any headache, denies abdominal pain.  1-UTI: Continue with Duricef 500 mg for 5 days. Follow urine culture result, adjust antibiotics as needed.  Pharmacy could to assist with these.   2-Chronic thrombocytopenia; In the setting of alcohol abuse. Platelet 1 month ago at 121. Platelet increasing today at 85 from 63. Please follow-up pathology smear results  3-Transaminases: Chronic, suspect related to alcohol abuse . Trending down AST 312 down to 157, ALT 145 down to 102 Continue to hold statins Hepatitis panel negative Ultrasound:The distal common bile duct is dilated, but mid common bile duct is normal in size. This appearance is unchanged compared to 2017 CT and likely within normal limits for this patient. Recommend correlation with lab values to exclude distal biliary obstruction. No cholelithiasis. Echogenic liver likely related to fatty infiltration. Need weight loss for fatty infiltration of the liver. Ultrasound finding stable Patient denies abdominal pain No elevation of alkaline phosphatase. Make sure medications are reviewed with pharmacy to avoid hepatotoxicity  We will sign off, please call us back with questions as needed. Joanna Hall Md.

## 2022-05-18 NOTE — Progress Notes (Signed)
Pt asleep in quiet room. All scheduled medications held as pt has been asleep since approximately 1145 this shift. Respirations noted and unlabored. 1:1 observation maintained for safety with staff in attendance at all times. Support and encouragement provided to pt. Safety maintained.

## 2022-05-18 NOTE — Progress Notes (Addendum)
Pt was walking in her room not responding to redirection from sitter on Close observation,  pt was feeling on the walls between the beds , lost her footing fell hittng back of her head on bed at 0105 . NP-Roy / AC-Kim notified at the same time. Pt assessed by Carloyn Manner and based on his evaluation , pt does not need to go to the ED at this time. Fall protocol started. Pt continues to be argumentative and combative at times making it difficult to take Bp at times.     05/18/22 0105  What Happened  Was fall witnessed? Yes  Who witnessed fall? Sir  Patients activity before fall ambulating-unassisted (pt was feeling on the walls between the beds , lost her footing fell hittng back of her head on bed)  Point of contact buttocks  Was patient injured? Yes  Follow Up  MD notified Carloyn Manner  Time MD notified 35  Family notified No - patient refusal  Simple treatment Ice  Adult Fall Risk Assessment  Risk Factor Category (scoring not indicated) History of more than one fall within 6 months before admission (document High fall risk);High fall risk per protocol (document High fall risk)  Patient Fall Risk Level High fall risk  Adult Fall Risk Interventions  Required Bundle Interventions *See Row Information* High fall risk - low, moderate, and high requirements implemented  Additional Interventions Assess orthostatic BP;Room near nurses station  Screening for Fall Injury Risk (To be completed on HIGH fall risk patients) - Assessing Need for Floor Mats  Risk For Fall Injury- Criteria for Floor Mats Bleeding risk-anticoagulation (not prophylaxis)  Pain Assessment  Pain Scale 0-10  Pain Score 0  PAINAD (Pain Assessment in Advanced Dementia)  Breathing 0  Negative Vocalization 0  Neurological  Neuro (WDL) X  Orientation Level Oriented to person  Cognition Poor judgement;Poor attention/concentration;Poor safety awareness  Speech Other (Comment) (same as before the fall)  Tremors  Tremor Location Hands   Tremor Severity Moderate  Musculoskeletal  Musculoskeletal (WDL) WDL  Assistive Device None  Integumentary  Integumentary (WDL) X

## 2022-05-18 NOTE — BHH Counselor (Signed)
Adult Comprehensive Assessment  Patient ID: [name], female   DOB: 1961/01/17, 61 y.o.   MRN: [id number]    Summary/Recommendations:   Summary and Recommendations (to be completed by the evaluator): CSW unable to complete a full assessment on patient because patient is too acute to participate.  Per chart review, patient was admitted to [facility] for increased depression and alcohol use.  Patient currently presents with bizarre behavior, agitation and aggression.  Patient has been drinking 2 bottles of wine a night since 2001. Patient also has had some family conflict and recently seperated from husband.  She is currently connected with Dr. Wylene Simmer and [facility] for outpatient follow up.  While here, [name] can benefit from crisis stabilization, medication management, therapeutic milieu, and referrals for services.  [name]. 05/18/2022

## 2022-05-18 NOTE — Progress Notes (Signed)
Pt confused, in and out of the bed attempting to pull AC unit off the wall. Pt cursing and being aggressive towards staff. Pt is waking up other pt's on the hall. Pt is non-redirectable.

## 2022-05-19 LAB — CBC WITH DIFFERENTIAL/PLATELET
Abs Immature Granulocytes: 0.03 10*3/uL (ref 0.00–0.07)
Basophils Absolute: 0 10*3/uL (ref 0.0–0.1)
Basophils Relative: 0 %
Eosinophils Absolute: 0.1 10*3/uL (ref 0.0–0.5)
Eosinophils Relative: 2 %
HCT: 35.7 % — ABNORMAL LOW (ref 36.0–46.0)
Hemoglobin: 12.1 g/dL (ref 12.0–15.0)
Immature Granulocytes: 1 %
Lymphocytes Relative: 30 %
Lymphs Abs: 0.9 10*3/uL (ref 0.7–4.0)
MCH: 34.7 pg — ABNORMAL HIGH (ref 26.0–34.0)
MCHC: 33.9 g/dL (ref 30.0–36.0)
MCV: 102.3 fL — ABNORMAL HIGH (ref 80.0–100.0)
Monocytes Absolute: 0.5 10*3/uL (ref 0.1–1.0)
Monocytes Relative: 16 %
Neutro Abs: 1.5 10*3/uL — ABNORMAL LOW (ref 1.7–7.7)
Neutrophils Relative %: 51 %
Platelets: 91 10*3/uL — ABNORMAL LOW (ref 150–400)
RBC: 3.49 MIL/uL — ABNORMAL LOW (ref 3.87–5.11)
RDW: 15.8 % — ABNORMAL HIGH (ref 11.5–15.5)
WBC: 3 10*3/uL — ABNORMAL LOW (ref 4.0–10.5)
nRBC: 0 % (ref 0.0–0.2)

## 2022-05-19 LAB — COMPREHENSIVE METABOLIC PANEL
ALT: 90 U/L — ABNORMAL HIGH (ref 0–44)
AST: 126 U/L — ABNORMAL HIGH (ref 15–41)
Albumin: 3.7 g/dL (ref 3.5–5.0)
Alkaline Phosphatase: 94 U/L (ref 38–126)
Anion gap: 8 (ref 5–15)
BUN: 17 mg/dL (ref 6–20)
CO2: 26 mmol/L (ref 22–32)
Calcium: 8.9 mg/dL (ref 8.9–10.3)
Chloride: 104 mmol/L (ref 98–111)
Creatinine, Ser: 0.69 mg/dL (ref 0.44–1.00)
GFR, Estimated: >60 mL/min (ref >60)
Glucose, Bld: 88 mg/dL (ref 70–99)
Potassium: 3 mmol/L — ABNORMAL LOW (ref 3.5–5.1)
Sodium: 138 mmol/L (ref 135–145)
Total Bilirubin: 2.3 mg/dL — ABNORMAL HIGH (ref 0.3–1.2)
Total Protein: 6.7 g/dL (ref 6.5–8.1)

## 2022-05-19 MED ORDER — WHITE PETROLATUM EX OINT
TOPICAL_OINTMENT | CUTANEOUS | Status: AC
  Filled 2022-05-19: qty 5

## 2022-05-19 NOTE — BHH Suicide Risk Assessment (Signed)
[place] INPATIENT:  Family/Significant Other Suicide Prevention Education  Suicide Prevention Education:  Education Completed; [name], son , [phone]    (name of family member/significant other) has been identified by the patient as the family member/significant other with whom the patient will be residing, and identified as the person(s) who will aid the patient in the event of a mental health crisis (suicidal ideations/suicide attempt).  With written consent from the patient, the family member/significant other has been provided the following suicide prevention education, prior to the and/or following the discharge of the patient.  CSW spoke with patient son who reports that patient has been using alcohol for decades and had an uptick of alcohol use after her break up with partner 2 months ago.  Son reports that she has become aggressive, yelled at neighbors and had hallucinations. He as well as other brother came down last week and had an intervention to get her help.  He would like her to get into inpatient treatment but has done some research and realizes that getting into inpatient with medicare has been hard.  Patient lives by herself and if she were to come home, son would come down to stay for a short period of time and believes intensive outpatient would need to be appropriate.  Patient currently lives by herself. No access to guns or weapons but patient has had suicidal ideation in the past.   The suicide prevention education provided includes the following: Suicide risk factors Suicide prevention and interventions National Suicide Hotline telephone number [facility] assessment telephone number [facility] Emergency Assistance [place] and/or Residential Mobile Crisis Unit telephone number  Request made of family/significant other to: Remove weapons (e.g., guns, rifles, knives), all items previously/currently identified as safety concern.   Remove  drugs/medications (over-the-counter, prescriptions, illicit drugs), all items previously/currently identified as a safety concern.  The family member/significant other verbalizes understanding of the suicide prevention education information provided.  The family member/significant other agrees to remove the items of safety concern listed above.  [name] E [name] 05/19/2022, 4:16 PM

## 2022-05-19 NOTE — Progress Notes (Signed)
   05/19/22 0530  Sleep  Number of Hours 9

## 2022-05-19 NOTE — Progress Notes (Signed)
1:1 Note 1900  Patient is sleeping in her bed.  1:1 present for safety.  Respirations even and unlabored.  No signs/symptoms of pain/distress noted on patient's face/ body movements.  Safety maintained with 1:1 per MD order.

## 2022-05-19 NOTE — Progress Notes (Addendum)
1:1 Note 12:30 pm  Patient is resting with eyes closed laying in her bed.  Respirations even and unlabored.  No signs/symptoms of pain/distress noted on patient's face/body movements.  Safety maintained with 1:1 for safety.   Patient ate 50% of her lunch.

## 2022-05-19 NOTE — Progress Notes (Addendum)
Pt fell and landed on the mattress on the floor in the quiet room, while trying to stand up      05/18/22 0357  What Happened  Was fall witnessed? Yes  Who witnessed fall? Imanni Burdine  Patients activity before fall ambulating-unassisted  Point of contact buttocks  Was patient injured? No  Follow Up  MD notified Carloyn Manner  Time MD notified [phone]  Family notified No - patient refusal  Adult Fall Risk Assessment  Risk Factor Category (scoring not indicated) High fall risk per protocol (document High fall risk)  Patient Fall Risk Level High fall risk  Adult Fall Risk Interventions  Required Bundle Interventions *See Row Information* High fall risk - low, moderate, and high requirements implemented  Additional Interventions Assess orthostatic BP  Screening for Fall Injury Risk (To be completed on HIGH fall risk patients) - Assessing Need for Floor Mats  Risk For Fall Injury- Criteria for Floor Mats None identified - No additional interventions needed  Will Implement Floor Mats Yes  Pain Assessment  Pain Scale 0-10  Pain Score 0  PAINAD (Pain Assessment in Advanced Dementia)  Breathing 0  Negative Vocalization 0  Neurological  Neuro (WDL) X  Orientation Level Oriented to person  Cognition Poor attention/concentration  Tremors  Tremor Location Hands  Tremor Severity Moderate  Integumentary  Integumentary (WDL) X  Skin Integrity Hematoma (Right forehead)

## 2022-05-19 NOTE — Group Note (Signed)
Recreation Therapy Group Note   Group Topic:Team Building  Group Date: 05/19/2022 Start Time: 1000 End Time: 1020 Facilitators: Victorino Sparrow, LRT,CTRS Location: 500 Hall Dayroom   Goal Area(s) Addresses:  Patient will effectively work with peer towards shared goal.  Patient will identify skills used to make activity successful.  Patient will identify how skills used during activity can be used to reach post d/c goals.   Group Description:  Straw Bridge. In teams of 3-5, patients were given 15 plastic drinking straws and an equal length of masking tape. Using the materials provided, patients were instructed to build a free standing bridge-like structure to suspend an everyday item (ex: puzzle box) off of the floor or table surface. All materials were required to be used by the team in their design. LRT facilitated post-activity discussion reviewing team process. Patients were encouraged to reflect how the skills used in this activity can be generalized to daily life post discharge.   Affect/Mood: N/A   Participation Level: Did not attend    Clinical Observations/Individualized Feedback:     Plan: Continue to engage patient in RT group sessions 2-3x/week.   Victorino Sparrow, LRT,CTRS 05/19/2022 1:08 PM

## 2022-05-19 NOTE — Progress Notes (Signed)
1:1 Note [place]   Patient's son, Sharol Given, phone [phone] would like to talk to MD.

## 2022-05-19 NOTE — Progress Notes (Signed)
1:1 Note 7209  Patient out of bed, walked to medication window with 1:1 using gait belt.  Respirations even and unlabored.  No signs/symptoms of pain/distress noted on patient's face, body movements.  Patient walked back to her room with 1:1.  Safety maintained with 1:1 per MD orders.

## 2022-05-19 NOTE — Progress Notes (Signed)
Nursing 1:1 note D:Pt observed sleeping in bed with eyes closed. RR even and unlabored. No distress noted. A: 1:1 observation continues for safety  R: pt remains safe  

## 2022-05-19 NOTE — Progress Notes (Signed)
1:1 Note 8:00 am  Patient continues to lay in bed resting with eyes closed.   Respirations even and unlabored.  No signs/symptoms of pain/distress noted on patient's face/body movements.  1:1 continues for safety per MD order.

## 2022-05-19 NOTE — Progress Notes (Signed)
[facility] MD Progress Note  24-May-2022 2:36 PM [name]  MRN:  [id number]   Reason for Admission:  [name] is a 61 y.o. female with a history of depression and alcohol dependence, who was initially admitted for inpatient psychiatric hospitalization on 05/16/2022 for management of increased depression, alcohol use, alcohol withdrawals. The patient is currently on Hospital Day 3.   Chart Review from last 24 hours:  Lab review indicates AST/ALT continue to improve yet remain elevated 126/90, total bilirubin also improving yet still elevated 2.3 platelet count still low but improving today 91, vitals also were reviewed no abnormalities noted.  Medication review indicates patient did not require any as needed medication for agitation or aggression yesterday.  Staff reports she slept most of the day yesterday except woke up in the evening about 1 to 2 hours was easily redirectable but continued to be confused with agitation.  This morning patient is waking up seems to be more oriented was able to be transferred from seclusion room to regular room on the acute unit, currently has a sitter one-to-one to ensure safety.  Information Obtained Today During Patient Interview: Upon evaluation this morning patient is lying down in bed, she continues to display tremors and shaking otherwise does not display any diaphoresis, denies nausea or vomiting.  Presents alert, oriented to her name, place name and city and state, oriented to the date month and year able to tell me that she came here for alcohol but unable to report any further details except noting that she did not want to stay here.  She does not recall any events for the last 2 days and does not look surprised when I mentioned to her about auditory and visual hallucinations reported for the past 2 days.  She does admit to drinking alcohol at home but confused to specify any amounts, she does agree that she was feeling depressed then goes on to describe "my  uncle and my cousin died in 05/25/2023 I a mass shooting, when asked her to tell me more details she responds "it is real it happens I can show to you online", based on my conversation with her son this is unreal and probably related to patient's confusion and delusion.  Patient denies any current SI HI or AVH and does not present responding to stimuli except for noted delusion above.  Patient is able to consent for staff to communicate with her sons for collateral information and to discuss plan of care.  Patient is able to tell me that she stopped taking and tells me that all she needs is outpatient rehab "where I go few times a week I go for groups" I discussed with her recommendation for inpatient residential rehab given severity of her alcohol dependence but discussed with her to think about it and will discuss late.  I did discuss with her reason for being in the hospital and current treatment plan including management of alcohol withdrawal including confusion and psychosis. We will continue to monitor and as she clears cognitively will assess for depression and dementia. I did contact patient's son [name] and discussed current treatment plan including update of patient's today presentation.   Sleep  Sleep: Improved, slept all night  Principal Problem: Substance induced mood disorder ([place]) Diagnosis: Principal Problem:   Substance induced mood disorder [facility])    Past Psychiatric History: Prior Psychiatric diagnoses: Depression and alcohol dependence Past Psychiatric Hospitalizations: "[facility]" notes was last admitted to psychiatric unit in November 2022 but unsure  why   History of self mutilation: Not reported Past suicide attempts: Son reports history of suicide ideation but unsure if she had any attempts Past history of HI, violent or aggressive behavior: None reported   Past Psychiatric medications trials: Recently on Klonopin and Ambien prescribed by outpatient psychiatrist, otherwise  unknown History of ECT/TMS: None reported   Outpatient psychiatric Follow up: Sees outpatient psychiatric provider Dr. Robina Ade in [place]. Prior Outpatient Therapy: None reported  Past Medical History:  Past Medical History:  Diagnosis Date   Alcohol dependence ([place])    recent IVC at [facility] 10/ 2015 by son--  11-07-2014 pt states no alcohol since 09-11-2014 (approx)   Anxiety    Frequency of urination    GERD (gastroesophageal reflux disease)    Headache(784.0)    Hypertension    no meds per pt,  states bp been ok    Major depression    PTSD (post-traumatic stress disorder)    Renal calculus, right    Right ureteral stone    Urgency of urination     Past Surgical History:  Procedure Laterality Date   BILATERAL SALPINGECTOMY Bilateral 03/19/2014   Procedure: BILATERAL SALPINGECTOMY;  Surgeon: Lovenia Kim, MD;  Location: [facility];  Service: Gynecology;  Laterality: Bilateral;   CESAREAN SECTION  1991   HOLMIUM LASER APPLICATION Right 03/0/0923   Procedure: HOLMIUM LASER APPLICATION;  Surgeon: Alexis Frock, MD;  Location: [facility];  Service: Urology;  Laterality: Right;   ROBOTIC ASSISTED TOTAL HYSTERECTOMY N/A 03/19/2014   Procedure: ROBOTIC ASSISTED TOTAL HYSTERECTOMY;  Surgeon: Lovenia Kim, MD;  Location: [facility];  Service: Gynecology;  Laterality: N/A;   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   Family History:  Family History  Problem Relation Age of Onset   Alcohol abuse Father    Family Psychiatric  History: Psychiatric illness: Multiple family members with depression including both sons, brother and mother Suicide: None reported Substance Abuse: Father and other family members were alcoholics   Social History: Living situation: Currently lives alone in [place]: [place] are supportive but they live in [place] and [place] Marital Status: Was married once currently  divorced Children: 2 sons, 1 lives in [place] and one lives in [place] Education: Unknown Employment: On Office manager service: None noted Legal history: None noted Trauma: Son reports patient has history of trauma growing up but unable to provide specific Access to guns: None noted  Sleep: Poor  Appetite:  Poor  Current Medications: Current Facility-Administered Medications  Medication Dose Route Frequency Provider Last Rate Last Admin   alum & mag hydroxide-simeth (MAALOX/MYLANTA) 200-200-20 MG/5ML suspension 30 mL  30 mL Oral Q4H PRN Ival Bible, MD       amitriptyline (ELAVIL) tablet 25 mg  25 mg Oral QHS Ival Bible, MD   25 mg at 05/18/22 2125   haloperidol (HALDOL) tablet 10 mg  10 mg Oral Q6H PRN Winfred Leeds, Toy Samarin, MD   10 mg at 05/18/22 0002   And   LORazepam (ATIVAN) tablet 2 mg  2 mg Oral Q6H PRN Winfred Leeds, Tricia Pledger, MD   2 mg at 05/18/22 0001   And   benztropine (COGENTIN) tablet 1 mg  1 mg Oral Q6H PRN Winfred Leeds, Graceanne [place], MD   1 mg at 05/18/22 1012   cefadroxil (DURICEF) capsule 500 mg  500 mg Oral BID Marylyn Ishihara, Tyrone A, DO   500 mg at 05/19/22 1033  haloperidol (HALDOL) tablet 10 mg  10 mg Oral BID Winfred Leeds, Samul Mcinroy, MD   10 mg at 05/19/22 1032   hydrOXYzine (ATARAX) tablet 25 mg  25 mg Oral TID PRN Ival Bible, MD       hydrOXYzine (ATARAX) tablet 25 mg  25 mg Oral Q6H PRN Ival Bible, MD   25 mg at 05/17/22 2033   ibuprofen (ADVIL) tablet 400 mg  400 mg Oral Q6H PRN Ival Bible, MD   400 mg at 05/19/22 1105   loperamide (IMODIUM) capsule 2-4 mg  2-4 mg Oral PRN Ival Bible, MD       LORazepam (ATIVAN) tablet 1 mg  1 mg Oral Q6H PRN Ival Bible, MD   1 mg at 05/17/22 1213   LORazepam (ATIVAN) tablet 2 mg  2 mg Oral QID Winfred Leeds, Mercadez Heitman, MD   2 mg at 05/19/22 1033   magnesium hydroxide (MILK OF MAGNESIA) suspension 30 mL  30 mL Oral Daily PRN Ival Bible, MD       metoprolol succinate (TOPROL-XL) 24 hr  tablet 50 mg  50 mg Oral QHS Ival Bible, MD   50 mg at 05/18/22 2125   multivitamin with minerals tablet 1 tablet  1 tablet Oral Daily Ival Bible, MD   1 tablet at 05/19/22 1032   ondansetron (ZOFRAN-ODT) disintegrating tablet 4 mg  4 mg Oral Q6H PRN Ival Bible, MD       pantoprazole (PROTONIX) EC tablet 40 mg  40 mg Oral Daily Ival Bible, MD   40 mg at 05/19/22 1032   SUMAtriptan (IMITREX) tablet 50 mg  50 mg Oral Q2H PRN Ival Bible, MD       thiamine (B-1) injection 100 mg  100 mg Intramuscular Once Ival Bible, MD       thiamine tablet 100 mg  100 mg Oral Daily Ival Bible, MD   100 mg at 05/19/22 1032   traZODone (DESYREL) tablet 100 mg  100 mg Oral QHS PRN Ival Bible, MD   100 mg at 05/18/22 2129    Lab Results:  Results for orders placed or performed during the hospital encounter of 05/16/22 (from the past 48 hour(s))  Comprehensive metabolic panel     Status: Abnormal   Collection Time: 05/18/22  5:20 AM  Result Value Ref Range   Sodium 138 135 - 145 mmol/L   Potassium 3.4 (L) 3.5 - 5.1 mmol/L   Chloride 102 98 - 111 mmol/L   CO2 24 22 - 32 mmol/L   Glucose, Bld 108 (H) 70 - 99 mg/dL    Comment: Glucose reference range applies only to samples taken after fasting for at least 8 hours.   BUN 23 (H) 6 - 20 mg/dL   Creatinine, Ser 0.89 0.44 - 1.00 mg/dL   Calcium 9.8 8.9 - 10.3 mg/dL   Total Protein 7.1 6.5 - 8.1 g/dL   Albumin 4.3 3.5 - 5.0 g/dL   AST 157 (H) 15 - 41 U/L   ALT 102 (H) 0 - 44 U/L   Alkaline Phosphatase 107 38 - 126 U/L   Total Bilirubin 2.4 (H) 0.3 - 1.2 mg/dL   GFR, Estimated >60 >60 mL/min    Comment: (NOTE) Calculated using the CKD-EPI Creatinine Equation (2021)    Anion gap 12 5 - 15    Comment: Performed at [facility], [place] [address] [place], [place] [address]  Hepatic function panel  Status: Abnormal   Collection Time: 05/18/22  6:20 AM  Result Value  Ref Range   Total Protein 7.1 6.5 - 8.1 g/dL   Albumin 4.3 3.5 - 5.0 g/dL   AST 157 (H) 15 - 41 U/L   ALT 102 (H) 0 - 44 U/L   Alkaline Phosphatase 107 38 - 126 U/L   Total Bilirubin 2.4 (H) 0.3 - 1.2 mg/dL   Bilirubin, Direct 0.6 (H) 0.0 - 0.2 mg/dL   Indirect Bilirubin 1.8 (H) 0.3 - 0.9 mg/dL    Comment: Performed at [facility], [place] [address], [place], [place] [address]  CBC with Differential/Platelet     Status: Abnormal   Collection Time: 05/18/22  6:20 AM  Result Value Ref Range   WBC 5.5 4.0 - 10.5 K/uL   RBC 3.43 (L) 3.87 - 5.11 MIL/uL   Hemoglobin 11.9 (L) 12.0 - 15.0 g/dL   HCT 35.1 (L) 36.0 - 46.0 %   MCV 102.3 (H) 80.0 - 100.0 fL   MCH 34.7 (H) 26.0 - 34.0 pg   MCHC 33.9 30.0 - 36.0 g/dL   RDW 15.8 (H) 11.5 - 15.5 %   Platelets 85 (L) 150 - 400 K/uL    Comment: SPECIMEN CHECKED FOR CLOTS Immature Platelet Fraction may be clinically indicated, consider ordering this additional test [id number] REPEATED TO VERIFY PLATELET COUNT CONFIRMED BY SMEAR    nRBC 0.0 0.0 - 0.2 %   Neutrophils Relative % 64 %   Neutro Abs 3.5 1.7 - 7.7 K/uL   Lymphocytes Relative 21 %   Lymphs Abs 1.2 0.7 - 4.0 K/uL   Monocytes Relative 14 %   Monocytes Absolute 0.8 0.1 - 1.0 K/uL   Eosinophils Relative 1 %   Eosinophils Absolute 0.1 0.0 - 0.5 K/uL   Basophils Relative 0 %   Basophils Absolute 0.0 0.0 - 0.1 K/uL   Immature Granulocytes 0 %   Abs Immature Granulocytes 0.02 0.00 - 0.07 K/uL    Comment: Performed at [facility], [place] [address] [place], [place] [address]  Glucose, capillary     Status: Abnormal   Collection Time: 05/18/22  6:21 AM  Result Value Ref Range   Glucose-Capillary 106 (H) 70 - 99 mg/dL    Comment: Glucose reference range applies only to samples taken after fasting for at least 8 hours.  Comprehensive metabolic panel     Status: Abnormal   Collection Time: 05/19/22  6:57 AM  Result Value Ref Range   Sodium 138 135 - 145  mmol/L   Potassium 3.0 (L) 3.5 - 5.1 mmol/L   Chloride 104 98 - 111 mmol/L   CO2 26 22 - 32 mmol/L   Glucose, Bld 88 70 - 99 mg/dL    Comment: Glucose reference range applies only to samples taken after fasting for at least 8 hours.   BUN 17 6 - 20 mg/dL   Creatinine, Ser 0.69 0.44 - 1.00 mg/dL   Calcium 8.9 8.9 - 10.3 mg/dL   Total Protein 6.7 6.5 - 8.1 g/dL   Albumin 3.7 3.5 - 5.0 g/dL   AST 126 (H) 15 - 41 U/L   ALT 90 (H) 0 - 44 U/L   Alkaline Phosphatase 94 38 - 126 U/L   Total Bilirubin 2.3 (H) 0.3 - 1.2 mg/dL   GFR, Estimated >60 >60 mL/min    Comment: (NOTE) Calculated using the CKD-EPI Creatinine Equation (2021)    Anion gap 8 5 - 15  Comment: Performed at [facility], [place] [address] [place], [place] [address]  CBC with Differential/Platelet     Status: Abnormal   Collection Time: 05/19/22  6:57 AM  Result Value Ref Range   WBC 3.0 (L) 4.0 - 10.5 K/uL   RBC 3.49 (L) 3.87 - 5.11 MIL/uL   Hemoglobin 12.1 12.0 - 15.0 g/dL   HCT 35.7 (L) 36.0 - 46.0 %   MCV 102.3 (H) 80.0 - 100.0 fL   MCH 34.7 (H) 26.0 - 34.0 pg   MCHC 33.9 30.0 - 36.0 g/dL   RDW 15.8 (H) 11.5 - 15.5 %   Platelets 91 (L) 150 - 400 K/uL    Comment: SPECIMEN CHECKED FOR CLOTS Immature Platelet Fraction may be clinically indicated, consider ordering this additional test [id number] REPEATED TO VERIFY PLATELET COUNT CONFIRMED BY SMEAR    nRBC 0.0 0.0 - 0.2 %   Neutrophils Relative % 51 %   Neutro Abs 1.5 (L) 1.7 - 7.7 K/uL   Lymphocytes Relative 30 %   Lymphs Abs 0.9 0.7 - 4.0 K/uL   Monocytes Relative 16 %   Monocytes Absolute 0.5 0.1 - 1.0 K/uL   Eosinophils Relative 2 %   Eosinophils Absolute 0.1 0.0 - 0.5 K/uL   Basophils Relative 0 %   Basophils Absolute 0.0 0.0 - 0.1 K/uL   Immature Granulocytes 1 %   Abs Immature Granulocytes 0.03 0.00 - 0.07 K/uL    Comment: Performed at [facility], [place] [address] [place],  [address]    Blood Alcohol  level:  Lab Results  Component Value Date   ETH 309 [facility]) 04/07/2022   ETH 262 (H) 70/35/0093    Metabolic Disorder Labs: No results found for: "HGBA1C", "MPG" No results found for: "PROLACTIN" Lab Results  Component Value Date   CHOL 217 (H) 05/15/2022   TRIG 111 05/15/2022   HDL 96 05/15/2022   CHOLHDL 2.3 05/15/2022   VLDL 22 05/15/2022   LDLCALC 99 05/15/2022    Physical Findings: AIMS:  , ,  ,  ,    CIWA:  CIWA-Ar Total: 0 COWS:     Musculoskeletal: Strength & Muscle Tone: within normal limits Gait & Station: unsteady Patient leans: N/A  Psychiatric Specialty Exam:  General Appearance: Appears in stated age, unkempt    Behavior: Pleasant and cooperative.  Confused  Psychomotor Activity:mild psychomotor activation noted  Eye Contact: Limited Speech: Decreased Speech Volume: Decreased   Mood: Dysphoric Affect: Blunt sad affect  Thought Process: Concrete and linear in general he had disorganized and delusional at times as noted above Descriptions of Associations: Intact but occasionally loose Thought Content: Hallucinations: Denies AVH and does not present responding to stimuli Delusions: No paranoia but some delusions noted regards family members got killed in shooting earlier this month Suicidal Thoughts: Denies SI intentional plan Homicidal Thoughts: Denies HI, intention, plan   Alertness: Alert Orientation: Oriented to person place and time, disoriented to situation   Insight: Poor Judgment: Poor  Memory: poor  Executive Functions  Concentration: Limited Attention Span: Easily distracted Recall: Limited Fund of Knowledge: Limited    Assets  Assets:Communication Skills; Desire for Improvement; Social Support; Resilience; Housing    Physical Exam: Physical Exam ROS Blood pressure 121/70, pulse 92, temperature 98.2 F (36.8 C), temperature source Oral, resp. rate (!) 22, height '5\' 1"'$  (1.549 m), weight 66.7 kg, last menstrual period  02/09/2014, SpO2 98 %. Body mass index is 27.78 kg/m.   Treatment Plan Summary:  ASSESSMENT:  Diagnoses / Active Problems: Principal Problem: Substance induced mood disorder ([facility]) Diagnosis: Principal Problem:   Substance induced mood disorder ([facility]) Probable delirium secondary to UTI Probable underlying MDD Dementia secondary to multiple factors including alcohol use, vascular  PLAN: Safety and Monitoring:             -- Patient was admitted voluntarily, will switch to involuntary admission to inpatient psychiatric unit for safety, stabilization and treatment             -- Daily contact with patient to assess and evaluate symptoms and progress in treatment             -- Patient's case to be discussed in multi-disciplinary team meeting             -- Observation Level : q15 minute checks             -- Vital signs:  q12 hours             -- Precautions: suicide, elopement, and assault   2. Medications:             Continue Ativan 2 mg 4 times daily for alcohol withdrawal, monitor efficacy and fever             Continue to monitor CIWA score            Continue Haldol 10 mg twice daily to address psychosis secondary to agitation            Continue Haldol Ativan Cogentin as needed for agitation             Continue Elavil for sleep/home medication --  The risks/benefits/side-effects/alternatives to this medication were discussed in detail with the patient and time was given for questions. The patient consents to medication trial.                -- Metabolic profile and EKG monitoring obtained while on an atypical antipsychotic (BMI: Lipid Panel: HbgA1c: QTc:)              Continue Duricef antibiotic for UTI             Continue home medications for and GERD  Consider restarting Lipitor as liver function continues to improve.  Continue monitoring as patient's cognition improved to reassess depression and dementia can start medications".                           3. Pertinent  labs: elevated liver function test 05/24/2021 but going off since then.  Also low platelet count, had normal platelets in 2017 and has been declining gradually since November 2022, recently 59 in emergency room prior to admission.  UA suggestive of UTI, EKG in the emergency room QTc 415 Head CT indicated brain atrophy and small vessel disease in addition to scalp hematoma related to fall last night 6/13      Lab ordered: Continue to follow CBC and liver function test.    4.  Hospitalist consult was requested to assist with management of UTI, elevated liver enzymes, low platelet count, will follow.                 5. Group and Therapy: -- Encouraged patient to participate in unit milieu and in scheduled group therapies                           As  patient improves cognitively we will address options for treatment for alcohol dependence including inpatient residential rehab versus outpatient rehab for alcohol dependence, limitations with dependence on insurance coverage as well as patient insight and willingness for treatment.                 -- Short Term Goals: Ability to identify changes in lifestyle to reduce recurrence of condition will improve, Ability to verbalize feelings will improve, Ability to disclose and discuss suicidal ideas, and Ability to demonstrate self-control will improve             -- Long Term Goals: Improvement in symptoms so as ready for discharge   6. Discharge Planning:              -- Social work and case management to assist with discharge planning and identification of hospital follow-up needs prior to discharge             -- Estimated LOS: 5-7 days             -- Discharge Concerns: Need to establish a safety plan; Medication compliance and effectiveness             -- Discharge Goals: Return home with outpatient referrals for mental health follow-up including medication management/psychotherapy     Total Time Spent in Direct Patient Care:  I personally spent 35  minutes on the unit in direct patient care. The direct patient care time included face-to-face time with the patient, reviewing the patient's chart, communicating with other professionals, and coordinating care. Greater than 50% of this time was spent in counseling or coordinating care with the patient regarding goals of hospitalization, psycho-education, and discharge planning needs.   Syrenity Klepacki Winfred Leeds, MD 05/19/2022, 2:36 PM

## 2022-05-19 NOTE — Progress Notes (Signed)
1:1 Note 1500 Patient continues to sleep in her bed.  Respirations even and unlabored.  No signs/symptoms of pain/distress noted on patient's face/body movements.  1:1 continues for patient's safety per MD orders.

## 2022-05-19 NOTE — Progress Notes (Signed)
Patient [name]. Mariselda states she has to use the bathroom. Frank slowly gets up with the gait belt around her waist for safety. Aroura uses the bathroom.Janeshia states she's hungry.Talicia states she wants something to eat. Staff gives [facility] 2 containers applesauce to eat. Yarima tolerate the applesauce well. Bentlee also drinks ginger ale.Kadelyn swallow her night medicine and goes back to sleep.Makinlee states she's sleepy.

## 2022-05-19 NOTE — Plan of Care (Signed)
Nurse discussed coping skills with patient.  

## 2022-05-19 NOTE — Progress Notes (Signed)
Pt stated she was feeling better, pt stated she understood the safety precautions in place(gait belt with ambulation)    05/19/22 2000  Psych Admission Type (Psych Patients Only)  Admission Status Voluntary  Psychosocial Assessment  Patient Complaints Substance abuse  Eye Contact Fair  Facial Expression Anxious  Affect Anxious;Preoccupied;Labile  Speech Pressured;Rapid  Interaction Intrusive  Motor Activity Restless;Fidgety  Appearance/Hygiene Disheveled  Behavior Characteristics Anxious  Mood Depressed;Anxious  Aggressive Behavior  Effect No apparent injury  Thought Process  Coherency Circumstantial  Content WDL  Delusions WDL  Perception WDL  Hallucination None reported or observed  Judgment Impaired  Confusion Mild  Danger to Self  Current suicidal ideation? Denies  Danger to Others  Danger to Others None reported or observed

## 2022-05-19 NOTE — [facility] Group Notes (Signed)
Patient did not attend the Wrap-up group. 

## 2022-05-19 NOTE — Progress Notes (Signed)
1:1 Note 9:30 am  Patient continues to rest in bed in quiet room with eyes closed.   Respirations even and unlabored.  No signs/symptoms of pain/distress noted on patient's face/body movements.  Safety maintained with 1:1 per MD order for safety.

## 2022-05-20 DIAGNOSIS — F332 Major depressive disorder, recurrent severe without psychotic features: Secondary | ICD-10-CM | POA: Diagnosis present

## 2022-05-20 LAB — PATHOLOGIST SMEAR REVIEW

## 2022-05-20 MED ORDER — POLYETHYLENE GLYCOL 3350 17 G PO PACK
17.0000 g | PACK | Freq: Every day | ORAL | Status: DC | PRN
  Administered 2022-05-21: 17 g via ORAL
  Filled 2022-05-20: qty 1

## 2022-05-20 MED ORDER — WHITE PETROLATUM EX OINT
TOPICAL_OINTMENT | CUTANEOUS | Status: AC
  Filled 2022-05-20: qty 5

## 2022-05-20 MED ORDER — FLUOXETINE HCL 20 MG PO CAPS
20.0000 mg | ORAL_CAPSULE | Freq: Every day | ORAL | Status: DC
Start: 2022-05-21 — End: 2022-05-26
  Administered 2022-05-21 – 2022-05-26 (×6): 20 mg via ORAL
  Filled 2022-05-20 (×8): qty 1

## 2022-05-20 MED ORDER — LORAZEPAM 0.5 MG PO TABS
0.5000 mg | ORAL_TABLET | Freq: Three times a day (TID) | ORAL | Status: AC
Start: 2022-05-22 — End: 2022-05-23
  Administered 2022-05-22 – 2022-05-23 (×3): 0.5 mg via ORAL
  Filled 2022-05-20 (×3): qty 1

## 2022-05-20 MED ORDER — LORAZEPAM 1 MG PO TABS
1.0000 mg | ORAL_TABLET | Freq: Three times a day (TID) | ORAL | Status: AC
Start: 2022-05-21 — End: 2022-05-22
  Administered 2022-05-21 – 2022-05-22 (×3): 1 mg via ORAL
  Filled 2022-05-20 (×3): qty 1

## 2022-05-20 MED ORDER — LORAZEPAM 1 MG PO TABS
2.0000 mg | ORAL_TABLET | Freq: Three times a day (TID) | ORAL | Status: AC
Start: 2022-05-20 — End: 2022-05-21
  Administered 2022-05-20 – 2022-05-21 (×3): 2 mg via ORAL
  Filled 2022-05-20 (×3): qty 2

## 2022-05-20 MED ORDER — FLUOXETINE HCL 10 MG PO CAPS
10.0000 mg | ORAL_CAPSULE | Freq: Every day | ORAL | Status: AC
  Administered 2022-05-20: 10 mg via ORAL
  Filled 2022-05-20: qty 1

## 2022-05-20 MED ORDER — HALOPERIDOL 5 MG PO TABS
5.0000 mg | ORAL_TABLET | Freq: Two times a day (BID) | ORAL | Status: DC
  Administered 2022-05-20 – 2022-05-22 (×4): 5 mg via ORAL
  Filled 2022-05-20 (×8): qty 1

## 2022-05-20 MED ORDER — LORAZEPAM 1 MG PO TABS: 2.0000 mg | ORAL_TABLET | Freq: Three times a day (TID) | ORAL | Status: DC

## 2022-05-20 MED ORDER — LORAZEPAM 0.5 MG PO TABS
0.5000 mg | ORAL_TABLET | Freq: Two times a day (BID) | ORAL | Status: AC
Start: 2022-05-23 — End: 2022-05-24
  Administered 2022-05-23 – 2022-05-24 (×2): 0.5 mg via ORAL
  Filled 2022-05-20 (×2): qty 1

## 2022-05-20 NOTE — Group Note (Signed)
LCSW Group Therapy Note   Group Date: 05/20/2022 Start Time: 1300 End Time: 1400   Type of Therapy and Topic:  Group Therapy: Boundaries  Participation Level:  Did Not Attend  Description of Group: This group will address the use of boundaries in their personal lives. Patients will explore why boundaries are important, the difference between healthy and unhealthy boundaries, and negative and postive outcomes of different boundaries and will look at how boundaries can be crossed.  Patients will be encouraged to identify current boundaries in their own lives and identify what kind of boundary is being set. Facilitators will guide patients in utilizing problem-solving interventions to address and correct types boundaries being used and to address when no boundary is being used. Understanding and applying boundaries will be explored and addressed for obtaining and maintaining a balanced life. Patients will be encouraged to explore ways to assertively make their boundaries and needs known to significant others in their lives, using other group members and facilitator for role play, support, and feedback.  Therapeutic Goals:  1.  Patient will identify areas in their life where setting clear boundaries could be  used to improve their life.  2.  Patient will identify signs/triggers that a boundary is not being respected. 3.  Patient will identify two ways to set boundaries in order to achieve balance in  their lives: 4.  Patient will demonstrate ability to communicate their needs and set boundaries  through discussion and/or role plays  Summary of Patient Progress:  Did not attend    Therapeutic Modalities:   Hope, LCSW 05/20/2022  11:59 AM

## 2022-05-20 NOTE — [facility] Group Notes (Signed)
[place] Group Notes:  (Nursing/MHT/Case Management/Adjunct)  Date:  05/20/2022  Time:  9:36 AM  Type of Therapy:   Orientation/Goals group  Participation Level:  Active  Participation Quality:  Appropriate  Affect:  Appropriate  Cognitive:  Appropriate  Insight:  Appropriate  Engagement in Group:  Engaged and Improving  Modes of Intervention:  Discussion, Education, Orientation, and Support  Summary of Progress/Problems: Pt goal for today is to try to rest and read.   Jennet Scroggin J Dayten Juba 05/20/2022, 9:36 AM

## 2022-05-20 NOTE — Progress Notes (Signed)
Nursing 1:1 note  D: Patient observed laying in bed with eyes closed. Snoring. Respirations even and unlabored. No distress noted.  A: 1:1 observation continues for safety  R: Patient remains safe.

## 2022-05-20 NOTE — Progress Notes (Signed)
Nursing 1:1 note D:Pt observed laying in bed with eyes open. RR even and unlabored. No distress noted. A: 1:1 observation continues for safety  R: pt remains safe

## 2022-05-20 NOTE — Group Note (Signed)
Recreation Therapy Group Note   Group Topic:Self-Esteem  Group Date: 05/20/2022 Start Time: 1005 End Time: 1040 Facilitators: Victorino Sparrow, LRT,CTRS Location: [address] Dayroom   Goal Area(s) Addresses:  Patient will successfully identify positive attributes about themselves.  Patient will identify healthy ways to increase self-esteem. Patient will acknowledge benefit(s) of improved self-esteem.   Group Description:  Haematologist.  LRT and patients discussed the importance of having positive self esteem.  Patients were then given a worksheet of an outline of a picture frame.  Patients were to create an image of themselves, things they like and things they are proud of within the mirror.  Patients shared their mirrors with the group when completed.   Affect/Mood: Appropriate   Participation Level: Engaged   Participation Quality: Independent   Behavior: Appropriate   Speech/Thought Process: Focused   Insight: Good   Judgement: Good   Modes of Intervention: Art   Patient Response to Interventions:  Engaged   Education Outcome:  Acknowledges education and In group clarification offered    Clinical Observations/Individualized Feedback: Pt was bright, engaged and attentive during group session.  Pt was able to focus and highlight some things about herself.  Pt highlighted her love of photography, painting and scuba diving.  Pt was pleasant an on task throughout group session.     Plan: Continue to engage patient in RT group sessions 2-3x/week.   Victorino Sparrow, LRT,CTRS  05/20/2022 1:39 PM

## 2022-05-20 NOTE — Progress Notes (Signed)
1:1 Observation  Patient continued on 1:1 Observation for safety and falls. Patient remains safe. Walking with steady gait. Support and encouragement provided.

## 2022-05-20 NOTE — Progress Notes (Signed)
Nursing 1:1 note  D: Patient observed sitting in dayroom laughing and talking with staff and peers. Calm. No distress noted.  A: 1:1 observation continues for safety  R: Patient remains safe

## 2022-05-20 NOTE — Progress Notes (Signed)
   05/20/22 0500  Sleep  Number of Hours 6

## 2022-05-20 NOTE — Progress Notes (Signed)
   05/20/22 1000  Psych Admission Type (Psych Patients Only)  Admission Status Voluntary  Psychosocial Assessment  Patient Complaints None  Eye Contact Fair  Facial Expression Other (Comment) (WDL)  Affect Appropriate to circumstance  Speech Logical/coherent  Interaction Other (Comment) (WDL)  Motor Activity Unsteady  Appearance/Hygiene Improved  Behavior Characteristics Cooperative;Appropriate to situation;Calm  Aggressive Behavior  Effect No apparent injury  Thought Process  Coherency Circumstantial  Content WDL  Delusions WDL  Perception WDL  Hallucination None reported or observed  Judgment Impaired  Confusion Mild  Danger to Self  Current suicidal ideation? Denies  Agreement Not to Harm Self Yes  Description of Agreement Verbal  Danger to Others  Danger to Others None reported or observed  Danger to Others Abnormal  Harmful Behavior to others No threats or harm toward other people  Destructive Behavior No threats or harm toward property

## 2022-05-20 NOTE — Progress Notes (Addendum)
[facility] MD Progress Note  05/20/2022 4:14 PM [name]  MRN:  [id number]   Reason for Admission:  [name] is a 61 y.o. female with a history of depression and alcohol dependence, who was initially admitted for inpatient psychiatric hospitalization on 05/16/2022 for management of increased depression, alcohol use, alcohol withdrawals. The patient is currently on Hospital Day 4.   Chart Review from last 24 hours:  No as needed medication for agitation or aggression needed or given.  CIWA score 4 at 6 AM and 0 at 11 AM with significant improvement.  Staff report patient to be calm in her room with no worsening psychosis or agitation or aggression cooperative with care and compliant with medication treatment but reported by staff to be unsteady on her feet sometimes. Labs follow-up for liver function test, platelet count is due Saturday morning.  Information Obtained Today During Patient Interview: Upon evaluation this morning patient is in the hallway with staff appears to be unsteady on her feet but able to maintain herself walking with no falls but having staff nearby.  She is redirectable easily to her room appears confused but very minimally presents alert oriented to place person and time but very limited regard to situation confused about reason she came to the hospital but able to tell me that she spoke to her son [name] yesterday and he is encouraging her to get help for drinking, she continues to refuse inpatient residential treatment for rehab for alcohol but agrees with intensive outpatient treatment, will continue to encourage inpatient residential.  She denies any current withdrawals and I discussed with her we will decrease scheduled Ativan and start tapering down.  She does not appear delusional today and when I discussed with her what she reported yesterday of family members dying in shooting she tells me it was on her head and she thought it was true.  She does admit to feeling  depressed "I have been depressed all my life" recently triggered by breaking up from her partner after 12 years.  She reports history of good response to Prozac for depression and agrees to restart it.  She denies SI HI or AVH and does not appear responding to stimuli during evaluation.  She does not display any signs of withdrawals except mild tremors at this time.  Sleep  Sleep: Improved, slept 6 hours last night, had multiple long naps yesterday  Principal Problem: MDD (major depressive disorder), recurrent severe, without psychosis ([place]) Diagnosis: Principal Problem:   MDD (major depressive disorder), recurrent severe, without psychosis ([place]) Active Problems:   Alcohol dependence ([place])    Past Psychiatric History: Prior Psychiatric diagnoses: Depression and alcohol dependence Past Psychiatric Hospitalizations: "[facility]" notes was last admitted to psychiatric unit in November 2022 but unsure why   History of self mutilation: Not reported Past suicide attempts: Son reports history of suicide ideation but unsure if she had any attempts Past history of HI, violent or aggressive behavior: None reported   Past Psychiatric medications trials: Recently on Klonopin and Ambien prescribed by outpatient psychiatrist, otherwise unknown History of ECT/TMS: None reported   Outpatient psychiatric Follow up: Sees outpatient psychiatric provider Dr. Robina Ade in [place]. Prior Outpatient Therapy: None reported  Past Medical History:  Past Medical History:  Diagnosis Date   Alcohol dependence ([place])    recent IVC at [facility] 10/ 2015 by son--  11-07-2014 pt states no alcohol since 09-11-2014 (approx)   Anxiety    Frequency of urination    GERD (gastroesophageal  reflux disease)    Headache(784.0)    Hypertension    no meds per pt,  states bp been ok    Major depression    PTSD (post-traumatic stress disorder)    Renal calculus, right    Right ureteral stone    Urgency of urination     Past  Surgical History:  Procedure Laterality Date   BILATERAL SALPINGECTOMY Bilateral 03/19/2014   Procedure: BILATERAL SALPINGECTOMY;  Surgeon: Lovenia Kim, MD;  Location: [facility];  Service: Gynecology;  Laterality: Bilateral;   CESAREAN SECTION  1991   HOLMIUM LASER APPLICATION Right 95/0/9326   Procedure: HOLMIUM LASER APPLICATION;  Surgeon: Alexis Frock, MD;  Location: [facility];  Service: Urology;  Laterality: Right;   ROBOTIC ASSISTED TOTAL HYSTERECTOMY N/A 03/19/2014   Procedure: ROBOTIC ASSISTED TOTAL HYSTERECTOMY;  Surgeon: Lovenia Kim, MD;  Location: [facility];  Service: Gynecology;  Laterality: N/A;   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   Family History:  Family History  Problem Relation Age of Onset   Alcohol abuse Father    Family Psychiatric  History: Psychiatric illness: Multiple family members with depression including both sons, brother and mother Suicide: None reported Substance Abuse: Father and other family members were alcoholics   Social History: Living situation: Currently lives alone in [place]: [place] are supportive but they live in [place] and [place] Marital Status: Was married once currently divorced Children: 2 sons, 1 lives in [place] and one lives in [place] Education: Unknown Employment: On Office manager service: None noted Legal history: None noted Trauma: Son reports patient has history of trauma growing up but unable to provide specific Access to guns: None noted  Sleep: Poor  Appetite:  Poor  Current Medications: Current Facility-Administered Medications  Medication Dose Route Frequency Provider Last Rate Last Admin   alum & mag hydroxide-simeth (MAALOX/MYLANTA) 200-200-20 MG/5ML suspension 30 mL  30 mL Oral Q4H PRN Ival Bible, MD       amitriptyline (ELAVIL) tablet 25 mg  25 mg Oral QHS Ival Bible, MD   25  mg at 05/19/22 2059   haloperidol (HALDOL) tablet 10 mg  10 mg Oral Q6H PRN Winfred Leeds, Cheetara Hoge, MD   10 mg at 05/18/22 0002   And   LORazepam (ATIVAN) tablet 2 mg  2 mg Oral Q6H PRN Winfred Leeds, Nicolas Banh, MD   2 mg at 05/18/22 0001   And   benztropine (COGENTIN) tablet 1 mg  1 mg Oral Q6H PRN Winfred Leeds, Isaic Syler, MD   1 mg at 05/18/22 1012   cefadroxil (DURICEF) capsule 500 mg  500 mg Oral BID Marylyn Ishihara, Tyrone A, DO   500 mg at 05/20/22 7124   FLUoxetine (PROZAC) capsule 10 mg  10 mg Oral Daily Winfred Leeds, Ezma Rehm, MD       Followed by   Derrill Memo ON 05/21/2022] FLUoxetine (PROZAC) capsule 20 mg  20 mg Oral Daily Issabela Lesko, MD       haloperidol (HALDOL) tablet 5 mg  5 mg Oral BID Winfred Leeds, Taylormarie Register, MD       hydrOXYzine (ATARAX) tablet 25 mg  25 mg Oral TID PRN Ival Bible, MD   25 mg at 05/20/22 0341   ibuprofen (ADVIL) tablet 400 mg  400 mg Oral Q6H PRN Ival Bible, MD   400 mg at 05/19/22 1105   LORazepam (ATIVAN) tablet 2 mg  2 mg Oral TID Dian Situ, MD  magnesium hydroxide (MILK OF MAGNESIA) suspension 30 mL  30 mL Oral Daily PRN Ival Bible, MD       metoprolol succinate (TOPROL-XL) 24 hr tablet 50 mg  50 mg Oral QHS Ival Bible, MD   50 mg at 05/19/22 2058   multivitamin with minerals tablet 1 tablet  1 tablet Oral Daily Ival Bible, MD   1 tablet at 05/20/22 0806   pantoprazole (PROTONIX) EC tablet 40 mg  40 mg Oral Daily Ival Bible, MD   40 mg at 05/20/22 0092   SUMAtriptan (IMITREX) tablet 50 mg  50 mg Oral Q2H PRN Ival Bible, MD       thiamine (B-1) injection 100 mg  100 mg Intramuscular Once Ival Bible, MD       thiamine tablet 100 mg  100 mg Oral Daily Ival Bible, MD   100 mg at 05/20/22 0806   traZODone (DESYREL) tablet 100 mg  100 mg Oral QHS PRN Ival Bible, MD   100 mg at 05/19/22 2059    Lab Results:  Results for orders placed or performed during the hospital encounter of 05/16/22 (from the past 48  hour(s))  Comprehensive metabolic panel     Status: Abnormal   Collection Time: 05/19/22  6:57 AM  Result Value Ref Range   Sodium 138 135 - 145 mmol/L   Potassium 3.0 (L) 3.5 - 5.1 mmol/L   Chloride 104 98 - 111 mmol/L   CO2 26 22 - 32 mmol/L   Glucose, Bld 88 70 - 99 mg/dL    Comment: Glucose reference range applies only to samples taken after fasting for at least 8 hours.   BUN 17 6 - 20 mg/dL   Creatinine, Ser 0.69 0.44 - 1.00 mg/dL   Calcium 8.9 8.9 - 10.3 mg/dL   Total Protein 6.7 6.5 - 8.1 g/dL   Albumin 3.7 3.5 - 5.0 g/dL   AST 126 (H) 15 - 41 U/L   ALT 90 (H) 0 - 44 U/L   Alkaline Phosphatase 94 38 - 126 U/L   Total Bilirubin 2.3 (H) 0.3 - 1.2 mg/dL   GFR, Estimated >60 >60 mL/min    Comment: (NOTE) Calculated using the CKD-EPI Creatinine Equation (2021)    Anion gap 8 5 - 15    Comment: Performed at [facility], [place] [address], [place], [place] [address]  CBC with Differential/Platelet     Status: Abnormal   Collection Time: 05/19/22  6:57 AM  Result Value Ref Range   WBC 3.0 (L) 4.0 - 10.5 K/uL   RBC 3.49 (L) 3.87 - 5.11 MIL/uL   Hemoglobin 12.1 12.0 - 15.0 g/dL   HCT 35.7 (L) 36.0 - 46.0 %   MCV 102.3 (H) 80.0 - 100.0 fL   MCH 34.7 (H) 26.0 - 34.0 pg   MCHC 33.9 30.0 - 36.0 g/dL   RDW 15.8 (H) 11.5 - 15.5 %   Platelets 91 (L) 150 - 400 K/uL    Comment: SPECIMEN CHECKED FOR CLOTS Immature Platelet Fraction may be clinically indicated, consider ordering this additional test [id number] REPEATED TO VERIFY PLATELET COUNT CONFIRMED BY SMEAR    nRBC 0.0 0.0 - 0.2 %   Neutrophils Relative % 51 %   Neutro Abs 1.5 (L) 1.7 - 7.7 K/uL   Lymphocytes Relative 30 %   Lymphs Abs 0.9 0.7 - 4.0 K/uL   Monocytes Relative 16 %   Monocytes Absolute 0.5 0.1 -  1.0 K/uL   Eosinophils Relative 2 %   Eosinophils Absolute 0.1 0.0 - 0.5 K/uL   Basophils Relative 0 %   Basophils Absolute 0.0 0.0 - 0.1 K/uL   Immature Granulocytes 1 %   Abs Immature  Granulocytes 0.03 0.00 - 0.07 K/uL    Comment: Performed at [facility], [place] [address] [place], [place] [address]    Blood Alcohol level:  Lab Results  Component Value Date   ETH [facility]) 04/07/2022   ETH 262 (H) 42/35/3614    Metabolic Disorder Labs: No results found for: "HGBA1C", "MPG" No results found for: "PROLACTIN" Lab Results  Component Value Date   CHOL 217 (H) 05/15/2022   TRIG 111 05/15/2022   HDL 96 05/15/2022   CHOLHDL 2.3 05/15/2022   VLDL 22 05/15/2022   LDLCALC 99 05/15/2022    Physical Findings: AIMS:  , ,  ,  ,    CIWA:  CIWA-Ar Total: 0 COWS:     Musculoskeletal: Strength & Muscle Tone: within normal limits Gait & Station: unsteady Patient leans: N/A  Psychiatric Specialty Exam:  General Appearance: Appears in stated age, unkempt    Behavior: Pleasant and cooperative.  Much less confused  Psychomotor Activity:mild psychomotor activation noted  Eye Contact: Limited Speech: Decreased Speech Volume: Decreased   Mood: Dysphoric Affect: Restricted sad affect  Thought Process: Concrete and linear in general, no disorganized thoughts noted today Descriptions of Associations: Intact Thought Content: Hallucinations: Denies AVH and does not present responding to stimuli Delusions: No paranoia but some delusions noted regards family members got killed in shooting earlier this month Suicidal Thoughts: Denies SI intention plan Homicidal Thoughts: Denies HI, intention, plan   Alertness: Alert Orientation: Oriented to person place and time, disoriented to situation   Insight: Improving yet limited Judgment: Improving yet limited  Memory: poor  Executive Functions  Concentration: Limited Attention Span: Easily distracted Recall: Limited Fund of Knowledge: Limited    Assets  Assets:Communication Skills; Desire for Improvement; Social Support; Resilience; Housing    Physical Exam: Physical Exam ROS Blood  pressure 122/79, pulse 88, temperature 98.4 F (36.9 C), temperature source Oral, resp. rate 14, height '5\' 1"'$  (1.549 m), weight 66.7 kg, last menstrual period 02/09/2014, SpO2 95 %. Body mass index is 27.78 kg/m.   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 Medications:  Taper down Ativan from 2 mg 4 times daily to 2 mg 3 times daily for alcohol withdrawal, continue to monitor and taper off gradually to stop completely in the next few days (ativan taper orders entered).             Continue to monitor CIWA score            Improved psychosis and agitation, taper down Haldol from 10 mg twice daily to 5 mg twice daily and monitor.              Continue Haldol Ativan Cogentin as needed for agitation             Continue Elavil for sleep/home medication  Start Prozac 10 mg daily today and up to 20 mg daily starting tomorrow for depression, monitor effects and safety --  The risks/benefits/side-effects/alternatives to this medication were discussed in detail with the  patient and time was given for questions. The patient consents to medication trial.                -- Metabolic profile and EKG monitoring obtained while on an atypical antipsychotic (BMI: Lipid Panel: HbgA1c: QTc:)              Continue Duricef antibiotic for UTI             Continue home medications for and GERD  Consider restarting Lipitor as liver function continues to improve, was on Lipitor at home was discontinued this admission secondary to elevated liver enzymes.  Continue monitoring as patient's cognition improved to reassess depression and dementia can start medications".                           3. Pertinent labs: elevated liver function test 05/24/2021 but going off since then.  Also low platelet count, had normal platelets in 2017 and has been declining gradually since November 2022, recently 4 in emergency room prior to admission.  UA suggestive of UTI, EKG in the emergency room QTc 415 Head CT indicated brain atrophy and small vessel disease in addition to scalp hematoma related to fall last night 6/13      Lab ordered: Continue to follow CBC and liver function test.    4.  Hospitalist consult was requested to assist with management of UTI, elevated liver enzymes, low platelet count, will follow.                 5. Group and Therapy: -- Encouraged patient to participate in unit milieu and in scheduled group therapies                           As patient improves cognitively we will address options for treatment for alcohol dependence including inpatient residential rehab versus outpatient rehab for alcohol dependence, limitations with dependence on insurance coverage as well as patient insight and willingness for treatment.                 -- Short Term Goals: Ability to identify changes in lifestyle to reduce recurrence of condition will improve, Ability to verbalize feelings will improve, Ability to disclose and discuss suicidal ideas, and Ability to demonstrate self-control  will improve             -- Long Term Goals: Improvement in symptoms so as ready for discharge   6. Discharge Planning:              --  Social work and case management to assist with discharge planning and identification of hospital follow-up needs prior to discharge             -- Estimated LOS: 5-7 days             -- Discharge Concerns: Need to establish a safety plan; Medication compliance and effectiveness             -- Discharge Goals: Return home with outpatient referrals for mental health follow-up including medication management/psychotherapy     Total Time Spent in Direct Patient Care:  I personally spent 35 minutes on the unit in direct patient care. The direct patient care time included face-to-face time with the patient, reviewing the patient's chart, communicating with other professionals, and coordinating care. Greater than 50% of this time was spent in counseling or coordinating care with the patient regarding goals of hospitalization, psycho-education, and discharge planning needs.   Ertha Nabor Winfred Leeds, MD 05/20/2022, 4:14 PM

## 2022-05-20 NOTE — Progress Notes (Signed)
Nursing 1:1 note  D: Patient observed walking in the hallway with MHT. Calm and cooperative. Talking with staff.  A: 1:1 observation continues for safety  R: Patient remains safe

## 2022-05-20 NOTE — Progress Notes (Signed)
Nursing 1:1 note D:Pt observed sleeping in bed with eyes closed. RR even and unlabored. No distress noted. A: 1:1 observation continues for safety  R: pt remains safe  

## 2022-05-21 NOTE — Progress Notes (Signed)
Pt denies SI/HI/AVH and verbally agrees to approach staff if these become apparent or before harming themselves/others. Rates depression 0/10. Rates anxiety 0/10. Rates pain 0/10. Pt has gone to groups and has been interacting with others. Pt is animated and talkative. Pt has been in and out of her room throughout the day. Pt has had no complaints. Pt has asked about when she was leaving and did seem slightly confused when she was asked to come get a medication. Scheduled medications administered to pt, per MD orders. RN provided support and encouragement to pt. Q15 min safety checks implemented and continued. Pt safe on the unit. RN will continue to monitor and intervene as needed.   05/21/22 0750  Psych Admission Type (Psych Patients Only)  Admission Status Voluntary  Psychosocial Assessment  Patient Complaints None  Eye Contact Fair  Facial Expression Other (Comment) (WDL)  Affect Appropriate to circumstance  Speech Logical/coherent  Interaction Assertive  Motor Activity Slow  Appearance/Hygiene Improved  Behavior Characteristics Cooperative;Appropriate to situation;Calm  Mood Pleasant;Euthymic  Thought Process  Coherency Circumstantial  Content WDL  Delusions None reported or observed  Perception WDL  Hallucination None reported or observed  Judgment Impaired  Confusion Mild  Danger to Self  Current suicidal ideation? Denies  Danger to Others  Danger to Others None reported or observed  Danger to Others Abnormal  Harmful Behavior to others No threats or harm toward other people  Destructive Behavior No threats or harm toward property

## 2022-05-21 NOTE — Progress Notes (Signed)
Adult Psychoeducational Group Note  Date:  05/21/2022 Time:  8:36 PM  Group Topic/Focus:  Wrap-Up Group:   The focus of this group is to help patients review their daily goal of treatment and discuss progress on daily workbooks.  Participation Level:  Active  Participation Quality:  Appropriate and Attentive  Affect:  Appropriate  Cognitive:  Alert and Appropriate  Insight: Appropriate  Engagement in Group:  Engaged  Modes of Intervention:  Discussion  Additional Comments:   Pt was engaged during group discussion. Pt states that she slept good today and has been excited to have some time to herself since being removed off of 1:1 obs. Pt states that one of her goals was to work on neg. Self talk and get a plan in place for her alcohol use issue. Pt denies everything and confirms communicating with her care team.  Gerhard Perches 05/21/2022, 8:36 PM

## 2022-05-21 NOTE — Progress Notes (Signed)
[facility] MD Progress Note  05/21/2022 6:29 PM [name]  MRN:  [id number]   Reason for Admission:  [name] is a 61 y.o. female with a history of depression and alcohol dependence, who was initially admitted for inpatient psychiatric hospitalization on 05/16/2022 for management of increased depression, alcohol use, alcohol withdrawals. The patient is currently on Hospital Day 5.   Chart Review from last 24 hours:  No as needed medication for agitation or aggression needed or given.  CIWA score between 0 and 4 since yesterday. Much more steady on her feet.  Much more lucid.   Information Obtained Today During Patient Interview: Upon evaluation this afternoon, the patient is much less confused. She recalls this interviewer, but not terribly well. She feels that she is doing "alright". Sleep is good. She isn't eating very much, but doesn't care for the food. She has been talking with her sons and they are supportive. She plans to return to home and seek sober programs based on in-home or partial treatment. She and her SO have patched things up. She feels much steadier on her feet now. She is still processing losing her dad. She is worried about her problems with anger.   We talk for a little while about her difficulties with relationships. She expresses some social anxiety and issues she has had in the past. She describes feeling like her thoughts are fragmented and her focus is hard to maintain. It appears she may have ADD, and might benefit from screening after completing a detox. We talk for a while about this and how it can impact people and make self-medicating with substances so problematic. She is interested in exploring testing, therapy and treatment.  Sleep  Sleep: Improved, slept 6 hours   Principal Problem: MDD (major depressive disorder), recurrent severe, without psychosis ([place]) Diagnosis: Principal Problem:   MDD (major depressive disorder), recurrent severe, without psychosis  ([place]) Active Problems:   Alcohol dependence ([place])    Past Psychiatric History: Prior Psychiatric diagnoses: Depression and alcohol dependence Past Psychiatric Hospitalizations: "[facility]" notes was last admitted to psychiatric unit in November 2022 but unsure why   History of self mutilation: Not reported Past suicide attempts: Son reports history of suicide ideation but unsure if she had any attempts Past history of HI, violent or aggressive behavior: None reported   Past Psychiatric medications trials: Recently on Klonopin and Ambien prescribed by outpatient psychiatrist, otherwise unknown History of ECT/TMS: None reported   Outpatient psychiatric Follow up: Sees outpatient psychiatric provider Dr. Robina Ade in [place]. Prior Outpatient Therapy: None reported  Past Medical History:  Past Medical History:  Diagnosis Date   Alcohol dependence ([place])    recent IVC at [facility] 10/ 2015 by son--  11-07-2014 pt states no alcohol since 09-11-2014 (approx)   Anxiety    Frequency of urination    GERD (gastroesophageal reflux disease)    Headache(784.0)    Hypertension    no meds per pt,  states bp been ok    Major depression    PTSD (post-traumatic stress disorder)    Renal calculus, right    Right ureteral stone    Urgency of urination     Past Surgical History:  Procedure Laterality Date   BILATERAL SALPINGECTOMY Bilateral 03/19/2014   Procedure: BILATERAL SALPINGECTOMY;  Surgeon: Lovenia Kim, MD;  Location: [facility];  Service: Gynecology;  Laterality: Bilateral;   CESAREAN SECTION  1991   HOLMIUM LASER APPLICATION Right 97/05/7340   Procedure: HOLMIUM LASER APPLICATION;  Surgeon: Alexis Frock, MD;  Location: [facility];  Service: Urology;  Laterality: Right;   ROBOTIC ASSISTED TOTAL HYSTERECTOMY N/A 03/19/2014   Procedure: ROBOTIC ASSISTED TOTAL HYSTERECTOMY;  Surgeon: Lovenia Kim, MD;  Location: [facility];  Service: Gynecology;  Laterality: N/A;   TRANSPHENOIDAL /  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 and one lives in [place] Education: Unknown Employment: On Office manager service: None noted Legal history: None noted Trauma: Son reports patient has history of trauma growing up but unable to provide specific Access to guns: None noted  Sleep: Poor  Appetite:  Poor  Current Medications: Current Facility-Administered Medications  Medication Dose Route Frequency Provider Last Rate Last Admin   alum & mag hydroxide-simeth (MAALOX/MYLANTA) 200-200-20 MG/5ML suspension 30 mL  30 mL Oral Q4H PRN Ival Bible, MD       amitriptyline (ELAVIL) tablet 25 mg  25 mg Oral QHS Ival Bible, MD   25 mg at 05/20/22 2047   haloperidol (HALDOL) tablet 10 mg  10 mg Oral Q6H PRN Winfred Leeds, Nadir, MD   10 mg at 05/18/22 0002   And   LORazepam (ATIVAN) tablet 2 mg  2 mg Oral Q6H PRN Winfred Leeds, Nadir, MD   2 mg at 05/18/22 0001   And   benztropine (COGENTIN) tablet 1 mg  1 mg Oral Q6H PRN Winfred Leeds, Nadir, MD   1 mg at 05/18/22 1012   cefadroxil (DURICEF) capsule 500 mg  500 mg Oral BID Marylyn Ishihara, Tyrone A, DO   500 mg at 05/21/22 1632   FLUoxetine (PROZAC) capsule 20 mg  20 mg Oral Daily Attiah, Nadir, MD   20 mg at 05/21/22 0755   haloperidol (HALDOL) tablet 5 mg  5 mg Oral BID Winfred Leeds, Nadir, MD   5 mg at 05/21/22 0755   hydrOXYzine (ATARAX) tablet 25 mg  25 mg  Oral TID PRN Ival Bible, MD   25 mg at 05/20/22 0341   ibuprofen (ADVIL) tablet 400 mg  400 mg Oral Q6H PRN Ival Bible, MD   400 mg at 05/21/22 1558   LORazepam (ATIVAN) tablet 1 mg  1 mg Oral TID Dian Situ, MD       Followed by   Derrill Memo ON 05/22/2022] LORazepam (ATIVAN) tablet 0.5 mg  0.5 mg Oral TID Dian Situ, MD       Followed by   Derrill Memo ON 05/23/2022] LORazepam (ATIVAN) tablet 0.5 mg  0.5 mg Oral BID Attiah, Nadir, MD       magnesium hydroxide (MILK OF MAGNESIA) suspension 30 mL  30 mL Oral Daily PRN Ival Bible, MD   30 mL at 05/20/22 1738   metoprolol succinate (TOPROL-XL) 24 hr tablet 50 mg  50 mg Oral QHS Ival Bible, MD   50 mg at 05/20/22 2048   multivitamin with minerals tablet 1 tablet  1 tablet Oral Daily Ival Bible, MD   1 tablet  at 05/21/22 0755   pantoprazole (PROTONIX) EC tablet 40 mg  40 mg Oral Daily Ival Bible, MD   40 mg at 05/21/22 0755   polyethylene glycol (MIRALAX / GLYCOLAX) packet 17 g  17 g Oral Daily PRN Winfred Leeds, Nadir, MD   17 g at 05/21/22 0846   SUMAtriptan (IMITREX) tablet 50 mg  50 mg Oral Q2H PRN Ival Bible, MD       thiamine (B-1) injection 100 mg  100 mg Intramuscular Once Ival Bible, MD       thiamine tablet 100 mg  100 mg Oral Daily Ival Bible, MD   100 mg at 05/21/22 0755   traZODone (DESYREL) tablet 100 mg  100 mg Oral QHS PRN Ival Bible, MD   100 mg at 05/20/22 2047    Lab Results:  No results found for this or any previous visit (from the past 48 hour(s)).   Blood Alcohol level:  Lab Results  Component Value Date   ETH 309 (HH) 04/07/2022   ETH 262 (H) 76/72/0947    Metabolic Disorder Labs: No results found for: "HGBA1C", "MPG" No results found for: "PROLACTIN" Lab Results  Component Value Date   CHOL 217 (H) 05/15/2022   TRIG 111 05/15/2022   HDL 96 05/15/2022   CHOLHDL 2.3 05/15/2022   VLDL 22 05/15/2022   LDLCALC 99 05/15/2022     Physical Findings: AIMS: Facial and Oral Movements Muscles of Facial Expression: None, normal Lips and Perioral Area: None, normal Jaw: None, normal Tongue: None, normal,Extremity Movements Upper (arms, wrists, hands, fingers): None, normal Lower (legs, knees, ankles, toes): None, normal, Trunk Movements Neck, shoulders, hips: None, normal, Overall Severity Severity of abnormal movements (highest score from questions above): None, normal Incapacitation due to abnormal movements: None, normal Patient's awareness of abnormal movements (rate only patient's report): No Awareness, Dental Status Current problems with teeth and/or dentures?: No Does patient usually wear dentures?: No  CIWA:  CIWA-Ar Total: 0 COWS:     Musculoskeletal: Strength & Muscle Tone: within normal limits Gait & Station: unsteady Patient leans: N/A  Psychiatric Specialty Exam:  General Appearance: Appears in stated age, unkempt    Behavior: Pleasant and cooperative.  Much less confused  Psychomotor Activity:normal  Eye Contact: fair Speech: normal Speech Volume: regular   Mood: anxious Affect: congruent  Thought Process: Concrete and linear  Descriptions of Associations: Intact Thought Content: Hallucinations: Denies AVH and does not present responding to stimuli Delusions: No paranoia ,delusions  Suicidal Thoughts: Denies SI intention plan Homicidal Thoughts: Denies HI, intention, plan   Alertness: Alert Orientation: Oriented to person place and time   Insight: Improving yet limited Judgment: Improving yet limited  Memory: fair  Executive Functions  Concentration: Limited Attention Span: Easily distracted Recall: Limited Fund of Knowledge: Limited    Assets  Assets:Communication Skills; Desire for Improvement; Social Support; Resilience; Housing    Physical Exam: Physical Exam HENT:     Head:     Comments: Right eye hematoma Eyes:     Extraocular Movements: Extraocular  movements intact.  Musculoskeletal:     Cervical back: Normal range of motion.  Neurological:     Mental Status: She is alert.  Psychiatric:        Mood and Affect: Mood is anxious.        Speech: Speech normal.        Behavior: Behavior is cooperative.        Thought Content: Thought content normal.  Cognition and Memory: Cognition normal.    Review of Systems  Constitutional:  Negative for chills and fever.  Respiratory:  Negative for cough.   Cardiovascular:  Negative for chest pain.  Gastrointestinal:  Positive for constipation. Negative for nausea and vomiting.  Musculoskeletal:  Positive for myalgias.  Psychiatric/Behavioral:  Negative for hallucinations and suicidal ideas. The patient is nervous/anxious. The patient does not have insomnia.    Blood pressure 101/61, pulse 70, temperature 97.8 F (36.6 C), temperature source Oral, resp. rate 18, height '5\' 1"'$  (1.549 m), weight 66.7 kg, last menstrual period 02/09/2014, SpO2 92 %. Body mass index is 27.78 kg/m.   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 Medications:             Taper down Ativan from 2 mg 4 times daily to 2 mg 3 times daily for alcohol withdrawal, continue to monitor and taper off gradually to stop completely in the next few days (ativan taper orders entered).             Continue to monitor CIWA score            Improved psychosis and agitation, taper down Haldol from 10 mg twice daily to 5 mg twice daily and monitor.              Continue Haldol Ativan Cogentin as needed for agitation             Continue Elavil for sleep/home medication  Continue Prozac 10 mg daily today and up to 20 mg daily starting tomorrow for depression, monitor effects and safety --  The risks/benefits/side-effects/alternatives to this medication were discussed in detail with the patient and time was given for questions. The patient consents to medication trial.                -- Metabolic profile and EKG monitoring obtained while on an atypical antipsychotic (BMI: Lipid Panel: HbgA1c: QTc:)              Continue Duricef antibiotic for UTI             Continue home medications for and GERD  Consider restarting Lipitor as liver function continues to improve, was on Lipitor at home was discontinued this admission secondary to elevated liver enzymes.  Continue monitoring as patient's cognition improved to reassess depression and dementia can  start medications".                           3. Pertinent labs: elevated liver function test 05/24/2021 but going off since then.  Also low platelet count, had normal platelets in 2017 and has been declining gradually since November 2022, recently 13 in emergency room prior to admission.  UA suggestive of UTI, EKG in the emergency room QTc 415 Head CT indicated brain atrophy and small vessel disease in addition to scalp hematoma related to fall last night 6/13      Lab ordered: Continue to follow CBC and liver function test.    4.  Hospitalist consult was requested  to assist with management of UTI, elevated liver enzymes, low platelet count, will follow.                 5. Group and Therapy: -- Encouraged patient to participate in unit milieu and in scheduled group therapies                           As patient improves cognitively we will address options for treatment for alcohol dependence including inpatient residential rehab versus outpatient rehab for alcohol dependence, limitations with dependence on insurance coverage as well as patient insight and willingness for treatment.                 -- Short Term Goals: Ability to identify changes in lifestyle to reduce recurrence of condition will improve, Ability to verbalize feelings will improve, Ability to disclose and discuss suicidal ideas, and Ability to demonstrate self-control will improve             -- Long Term Goals: Improvement in symptoms so as ready for discharge   6. Discharge Planning:              -- Social work and case management to assist with discharge planning and identification of hospital follow-up needs prior to discharge             -- Estimated LOS: 5-7 days             -- Discharge Concerns: Need to establish a safety plan; Medication compliance and effectiveness             -- Discharge Goals: Return home with outpatient referrals for mental health follow-up including medication management/psychotherapy     Total Time Spent in Direct Patient Care:  I personally spent 35 minutes on the unit in direct patient care. The direct patient care time included face-to-face time with the patient, reviewing the patient's chart, communicating with other professionals, and coordinating care. Greater than 50% of this time was spent in counseling or coordinating care with the patient regarding goals of hospitalization, psycho-education, and discharge planning needs.   Maida Sale, MD 05/21/2022, 6:29 PM

## 2022-05-21 NOTE — Progress Notes (Signed)
1:1 note  Patient in bed sleeping respirations noted. No fall all fall protocol maintained.1:1 Observation ongoing. Patient remains safe.

## 2022-05-21 NOTE — Group Note (Signed)
LCSW Group Therapy Note  05/21/2022   10:00-11:00am   Type of Therapy and Topic:  Group Therapy: Anger Cues and Responses  Participation Level:  Active   Description of Group:   In this group, patients learned how to recognize the physical, cognitive, emotional, and behavioral responses they have to anger-provoking situations.  They identified a recent time they became angry and how they reacted.  They analyzed how their reaction was possibly beneficial and how it was possibly unhelpful.  The group discussed a variety of healthier coping skills that could help with such a situation in the future.  They also learned that anger is a second emotion fueled by other feelings and explored their own emotions that may frequently fuel their anger.  Focus was placed on how helpful it is to recognize the underlying emotions to our anger, because working on those can lead to a more permanent solution as well as our ability to focus on the important rather than the urgent.  Therapeutic Goals: Patients will remember their last incident of anger and how they felt emotionally and physically, what their thoughts were at the time, and how they behaved. Patients will identify how their behavior at that time worked for them, as well as how it worked against them. Patients will explore possible new behaviors to use in future anger situations. Patients will learn that anger itself is normal and cannot be eliminated, and that healthier reactions can assist with resolving conflict rather than worsening situations. Patients will learn that anger is a secondary emotion and worked to identify some of the underlying feelings that may lead to anger.  Summary of Patient Progress:  The patient shared that a frequent cause of anger was lack of communication from her partner and said her suspicion is that partner is cheating on her because her husband cheated one her.  She stated she will become so infuriated with her partner that  she will drink which makes things much worse.  Therapeutic Modalities:   Cognitive Behavioral Therapy  Maretta Los

## 2022-05-21 NOTE — [facility] Group Notes (Signed)
.  Psychoeducational Group Note  Date: 05/21/2022 Time: 0900-1000    Goal Setting   Purpose of Group: This group helps to provide patients with the steps of setting a goal that is specific, measurable, attainable, realistic and time specific. A discussion on how we keep ourselves stuck with negative self talk. Homework given for Patients to write 30 positive attributes about themselves.    Participation Level:  Active  Participation Quality:  Appropriate  Affect:  Appropriate  Cognitive:  Appropriate  Insight:  Improving  Engagement in Group:  Engaged  Additional Comments:  RAtes herself at a 6/10. States that she drank too much and is ashamed by her actions.  Paulino Rily

## 2022-05-21 NOTE — Group Note (Deleted)
LCSW Group Therapy Note   Group Date: 05/21/2022 Start Time: 1000 End Time: 1100   Type of Therapy and Topic:  Group Therapy:   Participation Level:  {[facility] PARTICIPATION XJOIT:25498}  Description of Group:   Therapeutic Goals:  1.     Summary of Patient Progress:    ***  Therapeutic Modalities:   Marquette Old 05/21/2022  9:41 AM

## 2022-05-21 NOTE — Progress Notes (Signed)
1:1 note  Patient in bed sleeping no s/s of distress no fall this shift. 1:1 Observation ongoing Patient remains safe.

## 2022-05-21 NOTE — Progress Notes (Signed)
1:1 notes   0813: pt is ambulating safely and steadily. Pt reports being able to walk much better. Pt has denied everything. Pt is in a good mood. Pt is safe on the unit and showing no signs of distress. MD d/c 1:1 order. RN will continue to monitor.

## 2022-05-22 DIAGNOSIS — Z1211 Encounter for screening for malignant neoplasm of colon: Secondary | ICD-10-CM | POA: Insufficient documentation

## 2022-05-22 DIAGNOSIS — R140 Abdominal distension (gaseous): Secondary | ICD-10-CM | POA: Insufficient documentation

## 2022-05-22 DIAGNOSIS — R7401 Elevation of levels of liver transaminase levels: Secondary | ICD-10-CM | POA: Insufficient documentation

## 2022-05-22 DIAGNOSIS — R102 Pelvic and perineal pain: Secondary | ICD-10-CM | POA: Insufficient documentation

## 2022-05-22 DIAGNOSIS — R7402 Elevation of levels of lactic acid dehydrogenase (LDH): Secondary | ICD-10-CM | POA: Insufficient documentation

## 2022-05-22 LAB — CBC WITH DIFFERENTIAL/PLATELET
Abs Immature Granulocytes: 0.04 10*3/uL (ref 0.00–0.07)
Basophils Absolute: 0 10*3/uL (ref 0.0–0.1)
Basophils Relative: 1 %
Eosinophils Absolute: 0.1 10*3/uL (ref 0.0–0.5)
Eosinophils Relative: 2 %
HCT: 38.3 % (ref 36.0–46.0)
Hemoglobin: 12.7 g/dL (ref 12.0–15.0)
Immature Granulocytes: 1 %
Lymphocytes Relative: 43 %
Lymphs Abs: 1.5 10*3/uL (ref 0.7–4.0)
MCH: 33.8 pg (ref 26.0–34.0)
MCHC: 33.2 g/dL (ref 30.0–36.0)
MCV: 101.9 fL — ABNORMAL HIGH (ref 80.0–100.0)
Monocytes Absolute: 0.6 10*3/uL (ref 0.1–1.0)
Monocytes Relative: 16 %
Neutro Abs: 1.3 10*3/uL — ABNORMAL LOW (ref 1.7–7.7)
Neutrophils Relative %: 37 %
Platelets: 144 10*3/uL — ABNORMAL LOW (ref 150–400)
RBC: 3.76 MIL/uL — ABNORMAL LOW (ref 3.87–5.11)
RDW: 15.6 % — ABNORMAL HIGH (ref 11.5–15.5)
WBC: 3.4 10*3/uL — ABNORMAL LOW (ref 4.0–10.5)
nRBC: 0 % (ref 0.0–0.2)

## 2022-05-22 LAB — COMPREHENSIVE METABOLIC PANEL
ALT: 79 U/L — ABNORMAL HIGH (ref 0–44)
AST: 85 U/L — ABNORMAL HIGH (ref 15–41)
Albumin: 3.8 g/dL (ref 3.5–5.0)
Alkaline Phosphatase: 91 U/L (ref 38–126)
Anion gap: 7 (ref 5–15)
BUN: 12 mg/dL (ref 6–20)
CO2: 28 mmol/L (ref 22–32)
Calcium: 9.1 mg/dL (ref 8.9–10.3)
Chloride: 103 mmol/L (ref 98–111)
Creatinine, Ser: 0.72 mg/dL (ref 0.44–1.00)
GFR, Estimated: >60 mL/min (ref >60)
Glucose, Bld: 127 mg/dL — ABNORMAL HIGH (ref 70–99)
Potassium: 4.1 mmol/L (ref 3.5–5.1)
Sodium: 138 mmol/L (ref 135–145)
Total Bilirubin: 1.3 mg/dL — ABNORMAL HIGH (ref 0.3–1.2)
Total Protein: 6.6 g/dL (ref 6.5–8.1)

## 2022-05-22 MED ORDER — MIRTAZAPINE 15 MG PO TABS
15.0000 mg | ORAL_TABLET | Freq: Every evening | ORAL | Status: DC | PRN
  Administered 2022-05-23: 15 mg via ORAL
  Filled 2022-05-22 (×2): qty 1

## 2022-05-22 MED ORDER — HALOPERIDOL 2 MG PO TABS
2.0000 mg | ORAL_TABLET | Freq: Two times a day (BID) | ORAL | Status: DC
  Administered 2022-05-22 – 2022-05-24 (×4): 2 mg via ORAL
  Filled 2022-05-22 (×7): qty 1

## 2022-05-22 NOTE — Progress Notes (Signed)
D:  Patients self inventory sheet, patient denied SI and HI, contracts for safety.  Denied A/V hallucinations.   A:  Medications administered per MD orders.  Emotional support and encouragement given patient. R:  Safety maintained with 15 minute checks.

## 2022-05-22 NOTE — Progress Notes (Signed)
Adult Psychoeducational Group Note  Date:  05/22/2022 Time:  8:19 PM  Group Topic/Focus:  Wrap-Up Group:   The focus of this group is to help patients review their daily goal of treatment and discuss progress on daily workbooks.  Participation Level:  Active  Participation Quality:  Appropriate and Attentive  Affect:  Appropriate  Cognitive:  Alert and Appropriate  Insight: Appropriate  Engagement in Group:  Engaged  Modes of Intervention:  Discussion  Additional Comments:   Pt was engaged during group discussion. Pt stated that she had a good day and was excited about her opportunity to go outside. Pt states that one of her goals is to refrain from drinking and talk to her SW about a plan to commit to this goal.  Gerhard Perches 05/22/2022, 8:19 PM

## 2022-05-22 NOTE — Plan of Care (Signed)
Nurse discussed anxiety, depression and coping skills with patient.  

## 2022-05-22 NOTE — [facility] Group Notes (Signed)
Adult Psychoeducational Group Not Date:  05/22/2022 Time:  0900-1045 Group Topic/Focus: PROGRESSIVE RELAXATION. A group where deep breathing is taught and tensing and relaxation muscle groups is used. Imagery is used as well.  Pts are asked to imagine 3 pillars that hold them up when they are not able to hold themselves up and to share that with the group.  Participation Level:  Active  Participation Quality:  Appropriate  Affect:  Appropriate  Cognitive:  Oriented  Insight: Improving  Engagement in Group:  Engaged  Modes of Intervention:  Activity, Discussion, Education, and Support  Additional Comments:  Rates her energy at a 6/10. States her 2 boys, photography and her girlfriend hold her up.  Paulino Rily

## 2022-05-22 NOTE — Progress Notes (Addendum)
[facility] MD Progress Note  05/22/2022 5:56 PM [name]  MRN:  [id number]   Reason for Admission:  [name] is a 61 y.o. female with a history of depression and alcohol dependence, who was initially admitted for inpatient psychiatric hospitalization on 05/16/2022 for management of increased depression, alcohol use, alcohol withdrawals. The patient is currently on Hospital Day 6.   Chart Review from last 24 hours:  No as needed medication for agitation or aggression needed or given.  CIWA score between 0 and 4 since yesterday. Much more steady on her feet.  Much more lucid.   Information Obtained Today During Patient Interview: Upon evaluation this afternoon, the patient is in good spirits. She is sleeping well, but her appetite is not very good and worse than yesterday. Constipation is also worse. She finds that she is still angry. We talk about it for a little while and this seems to be a consistent theme that is present even outside of depressive episode. Perhaps a feature of ADD? She talks about her recent losses and her fears of losing her current relationship, despite no signs from her current partner. She misses her dog. No hallucinations, thoughts of harm to self or others today. She does not feel that she is ready to go, states "I'm just sad" and is concerned that she is just not able to cope.   Sleep  Sleep: Improved, slept 8 hours   Principal Problem: MDD (major depressive disorder), recurrent severe, without psychosis ([place]) Diagnosis: Principal Problem:   MDD (major depressive disorder), recurrent severe, without psychosis ([place]) Active Problems:   Anxiety state   Alcohol dependence ([place])    Past Psychiatric History: Prior Psychiatric diagnoses: Depression and alcohol dependence Past Psychiatric Hospitalizations: "[facility]" notes was last admitted to psychiatric unit in November 2022 but unsure why   History of self mutilation: Not reported Past suicide attempts: Son reports  history of suicide ideation but unsure if she had any attempts Past history of HI, violent or aggressive behavior: None reported   Past Psychiatric medications trials: Recently on Klonopin and Ambien prescribed by outpatient psychiatrist, otherwise unknown History of ECT/TMS: None reported   Outpatient psychiatric Follow up: Sees outpatient psychiatric provider Dr. Robina Ade in [place]. Prior Outpatient Therapy: None reported  Past Medical History:  Past Medical History:  Diagnosis Date   Alcohol dependence ([place])    recent IVC [facility] 10/ 2015 by son--  11-07-2014 pt states no alcohol since 09-11-2014 (approx)   Anxiety    Frequency of urination    GERD (gastroesophageal reflux disease)    Headache(784.0)    Hypertension    no meds per pt,  states bp been ok    Major depression    PTSD (post-traumatic stress disorder)    Renal calculus, right    Right ureteral stone    Urgency of urination     Past Surgical History:  Procedure Laterality Date   BILATERAL SALPINGECTOMY Bilateral 03/19/2014   Procedure: BILATERAL SALPINGECTOMY;  Surgeon: Lovenia Kim, MD;  Location: [facility];  Service: Gynecology;  Laterality: Bilateral;   CESAREAN SECTION  1991   HOLMIUM LASER APPLICATION Right 66/0/6301   Procedure: HOLMIUM LASER APPLICATION;  Surgeon: Alexis Frock, MD;  Location: [facility];  Service: Urology;  Laterality: Right;   ROBOTIC ASSISTED TOTAL HYSTERECTOMY N/A 03/19/2014   Procedure: ROBOTIC ASSISTED TOTAL HYSTERECTOMY;  Surgeon: Lovenia Kim, MD;  Location: [facility];  Service: Gynecology;  Laterality: N/A;   TRANSPHENOIDAL /  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   Family History:  Family History  Problem Relation Age of Onset   Alcohol abuse Father    Family Psychiatric  History: Psychiatric illness: Multiple family members with depression including both sons, brother and mother Suicide: None reported Substance  Abuse: Father and other family members were alcoholics   Social History: Living situation: Currently lives alone in [place]: Sons are supportive but they live in [place] Marital Status: Was married once currently divorced Children: 2 sons, 1 lives in [place] and one lives in [place] Education: Unknown Employment: On Office manager service: None noted Legal history: None noted Trauma: Son reports patient has history of trauma growing up but unable to provide specific Access to guns: None noted  Sleep: Poor  Appetite:  Poor  Current Medications: Current Facility-Administered Medications  Medication Dose Route Frequency Provider Last Rate Last Admin   alum & mag hydroxide-simeth (MAALOX/MYLANTA) 200-200-20 MG/5ML suspension 30 mL  30 mL Oral Q4H PRN Ival Bible, MD       haloperidol (HALDOL) tablet 10 mg  10 mg Oral Q6H PRN Winfred Leeds, Nadir, MD   10 mg at 05/18/22 0002   And   LORazepam (ATIVAN) tablet 2 mg  2 mg Oral Q6H PRN Winfred Leeds, Nadir, MD   2 mg at 05/18/22 0001   And   benztropine (COGENTIN) tablet 1 mg  1 mg Oral Q6H PRN Winfred Leeds, Nadir, MD   1 mg at 05/18/22 1012   cefadroxil (DURICEF) capsule 500 mg  500 mg Oral BID Marylyn Ishihara, Tyrone A, DO   500 mg at 05/22/22 0748   FLUoxetine (PROZAC) capsule 20 mg  20 mg Oral Daily Attiah, Nadir, MD   20 mg at 05/22/22 0748   haloperidol (HALDOL) tablet 2 mg  2 mg Oral BID Lakyia Behe, Jackie Plum, MD       hydrOXYzine (ATARAX) tablet 25 mg  25 mg Oral TID PRN Ival Bible, MD   25 mg at 05/20/22 0341   ibuprofen (ADVIL) tablet 400 mg  400 mg Oral Q6H PRN Ival Bible, MD   400 mg at 05/22/22 1123   LORazepam (ATIVAN) tablet 0.5 mg  0.5 mg Oral TID Dian Situ, MD       Followed by   Derrill Memo ON 05/23/2022] LORazepam (ATIVAN) tablet 0.5 mg  0.5 mg Oral BID Attiah, Nadir, MD       magnesium hydroxide (MILK OF MAGNESIA) suspension 30 mL  30 mL Oral Daily PRN Ival Bible, MD    30 mL at 05/20/22 1738   metoprolol succinate (TOPROL-XL) 24 hr tablet 50 mg  50 mg Oral QHS Ival Bible, MD   50 mg at 05/21/22 2050   mirtazapine (REMERON) tablet 15 mg  15 mg Oral QHS PRN Maida Sale, MD       multivitamin with minerals tablet 1 tablet  1 tablet Oral Daily Ival Bible, MD   1 tablet at 05/22/22 0748   pantoprazole (PROTONIX) EC tablet 40 mg  40 mg Oral Daily Ival Bible, MD   40 mg at 05/22/22 0748   polyethylene glycol (MIRALAX / GLYCOLAX) packet 17 g  17 g Oral Daily PRN Winfred Leeds, Nadir, MD   17 g at 05/21/22 0846   SUMAtriptan (IMITREX) tablet 50 mg  50 mg Oral Q2H PRN Ival Bible, MD       thiamine (B-1) injection 100 mg  100 mg Intramuscular Once Ival Bible, MD       thiamine tablet 100 mg  100 mg Oral Daily Ival Bible, MD   100 mg at 05/22/22 0748   traZODone (DESYREL) tablet 100 mg  100 mg Oral QHS PRN Ival Bible, MD   100 mg at 05/21/22 2050    Lab Results:  Results for orders placed or performed during the hospital encounter of 05/16/22 (from the past 48 hour(s))  Comprehensive metabolic panel     Status: Abnormal   Collection Time: 05/22/22  6:55 AM  Result Value Ref Range   Sodium 138 135 - 145 mmol/L   Potassium 4.1 3.5 - 5.1 mmol/L   Chloride 103 98 - 111 mmol/L   CO2 28 22 - 32 mmol/L   Glucose, Bld 127 (H) 70 - 99 mg/dL    Comment: Glucose reference range applies only to samples taken after fasting for at least 8 hours.   BUN 12 6 - 20 mg/dL   Creatinine, Ser 0.72 0.44 - 1.00 mg/dL   Calcium 9.1 8.9 - 10.3 mg/dL   Total Protein 6.6 6.5 - 8.1 g/dL   Albumin 3.8 3.5 - 5.0 g/dL   AST 85 (H) 15 - 41 U/L   ALT 79 (H) 0 - 44 U/L   Alkaline Phosphatase 91 38 - 126 U/L   Total Bilirubin 1.3 (H) 0.3 - 1.2 mg/dL   GFR, Estimated >60 >60 mL/min    Comment: (NOTE) Calculated using the CKD-EPI Creatinine Equation (2021)    Anion gap 7 5 - 15    Comment: Performed at [facility], [place] [address] [place], [place] [address]  CBC with Differential/Platelet     Status: Abnormal   Collection Time: 05/22/22  6:55 AM  Result Value Ref Range   WBC 3.4 (L) 4.0 - 10.5 K/uL   RBC 3.76 (L) 3.87 - 5.11 MIL/uL   Hemoglobin 12.7 12.0 - 15.0 g/dL   HCT 38.3 36.0 - 46.0 %   MCV 101.9 (H) 80.0 - 100.0 fL   MCH 33.8 26.0 - 34.0 pg   MCHC 33.2 30.0 - 36.0 g/dL   RDW 15.6 (H) 11.5 - 15.5 %   Platelets 144 (L) 150 - 400 K/uL   nRBC 0.0 0.0 - 0.2 %   Neutrophils Relative % 37 %   Neutro Abs 1.3 (L) 1.7 - 7.7 K/uL   Lymphocytes Relative 43 %   Lymphs Abs 1.5 0.7 - 4.0 K/uL   Monocytes Relative 16 %   Monocytes Absolute 0.6 0.1 - 1.0 K/uL   Eosinophils Relative 2 %   Eosinophils Absolute 0.1 0.0 - 0.5 K/uL   Basophils Relative 1 %   Basophils Absolute 0.0 0.0 - 0.1 K/uL   Immature Granulocytes 1 %   Abs Immature Granulocytes 0.04 0.00 - 0.07 K/uL    Comment: Performed at [facility], [place] [address], [place], [place] [address]     Blood Alcohol level:  Lab Results  Component Value Date   ETH 309 [facility]) 04/07/2022   ETH 262 (H) 24/40/1027    Metabolic Disorder Labs: No results found for: "HGBA1C", "MPG" No results found for: "PROLACTIN" Lab Results  Component Value Date   CHOL 217 (H) 05/15/2022   TRIG 111 05/15/2022   HDL 96 05/15/2022   CHOLHDL 2.3 05/15/2022   VLDL 22 05/15/2022   LDLCALC 99 05/15/2022    Physical Findings: AIMS: Facial and Oral Movements Muscles of  Facial Expression: None, normal Lips and Perioral Area: None, normal Jaw: None, normal Tongue: None, normal,Extremity Movements Upper (arms, wrists, hands, fingers): None, normal Lower (legs, knees, ankles, toes): None, normal, Trunk Movements Neck, shoulders, hips: None, normal, Overall Severity Severity of abnormal movements (highest score from questions above): None, normal Incapacitation due to abnormal movements: None, normal Patient's awareness of  abnormal movements (rate only patient's report): No Awareness, Dental Status Current problems with teeth and/or dentures?: No Does patient usually wear dentures?: No  CIWA:  CIWA-Ar Total: 0 COWS:     Musculoskeletal: Strength & Muscle Tone: within normal limits Gait & Station: unsteady Patient leans: N/A  Psychiatric Specialty Exam:  General Appearance: Appears in stated age, groomed   Behavior: Pleasant and cooperative.  Much less confused  Psychomotor Activity:normal  Eye Contact: fair Speech: normal Speech Volume: regular   Mood: anxious, depressed Affect: congruent  Thought Process: Concrete and linear  Descriptions of Associations: Intact Thought Content: Hallucinations: Denies AVH and does not present responding to stimuli Delusions: No paranoia ,delusions  Suicidal Thoughts: Denies SI intention plan Homicidal Thoughts: Denies HI, intention, plan   Alertness: Alert Orientation: Oriented to person place and time   Insight: Improving yet limited Judgment: Improving yet limited  Memory: fair  Executive Functions  Concentration: Limited Attention Span: Easily distracted Recall: fair Fund of Knowledge: fair    Assets  Assets:Communication Skills; Housing    Physical Exam: Physical Exam HENT:     Head: Normocephalic.     Comments: Right eye hematoma Eyes:     Extraocular Movements: Extraocular movements intact.  Musculoskeletal:     Cervical back: Normal range of motion.  Neurological:     General: No focal deficit present.     Mental Status: She is alert and oriented to person, place, and time.  Psychiatric:        Mood and Affect: Mood is anxious.        Speech: Speech normal.        Behavior: Behavior normal. Behavior is cooperative.        Thought Content: Thought content normal.        Cognition and Memory: Cognition normal.    Review of Systems  Constitutional:  Negative for chills and fever.  Respiratory:  Negative for cough.    Cardiovascular:  Negative for chest pain.  Gastrointestinal:  Positive for constipation. Negative for nausea and vomiting.  Musculoskeletal:  Negative for myalgias.  Psychiatric/Behavioral:  Positive for depression. Negative for hallucinations and suicidal ideas. The patient is nervous/anxious. The patient does not have insomnia.    Blood pressure 118/79, pulse 75, temperature 98 F (36.7 C), temperature source Oral, resp. rate 16, height '5\' 1"'$  (1.549 m), weight 66.7 kg, last menstrual period 02/09/2014, SpO2 98 %. Body mass index is 27.78 kg/m.   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 Medications:             Taper down Ativan from 2 mg 4 times daily to 2 mg 3 times daily for alcohol withdrawal, continue to monitor and taper off gradually to stop completely in the next few days (ativan taper orders entered).             Continue to monitor CIWA score            reduced haldol '2mg'$  PO BID             Continue Haldol Ativan Cogentin as needed for  agitation             stop Elavil - worsening constipation. Start remeron for sleep and appetite.   Continue Prozac '20mg'$  for depression, monitor effects and safety --  The risks/benefits/side-effects/alternatives to this medication were discussed in detail with the patient and time was given for questions. The patient consents to medication trial.                -- Metabolic profile and EKG monitoring obtained while on an atypical antipsychotic (BMI: Lipid Panel: HbgA1c: QTc:)              Continue Duricef antibiotic for UTI             Continue home medications for and GERD  Consider restarting Lipitor as liver function continues to improve, was on Lipitor at home was discontinued this admission secondary to elevated liver enzymes.  Continue monitoring as patient's cognition improved to reassess depression and dementia can start medications".                           3. Pertinent labs: elevated liver function test 05/24/2021 but going off since then.  Also low platelet count, had normal platelets in 2017 and has been declining gradually since November 2022, recently 77 in emergency room prior to admission.  UA suggestive of UTI, EKG in the emergency room QTc 415 Head CT indicated brain atrophy and small vessel disease in addition to scalp hematoma related to fall last night 6/13      Lab ordered: Continue to follow CBC and liver function test.    4.  Hospitalist consult was requested to assist with management of UTI, elevated liver enzymes, low platelet count, will follow.                 5. Group and Therapy: -- Encouraged patient to participate in unit milieu and in scheduled group therapies                           As patient improves cognitively we will address options for treatment for alcohol dependence including inpatient residential rehab versus outpatient rehab for alcohol dependence, limitations with dependence on insurance coverage as well as patient insight and willingness for  treatment.                 --  Short Term Goals: Ability to identify changes in lifestyle to reduce recurrence of condition will improve, Ability to verbalize feelings will improve, Ability to disclose and discuss suicidal ideas, and Ability to demonstrate self-control will improve             -- Long Term Goals: Improvement in symptoms so as ready for discharge   6. Discharge Planning:              -- Social work and case management to assist with discharge planning and identification of hospital follow-up needs prior to discharge             -- Estimated LOS: 5-7 days             -- Discharge Concerns: Need to establish a safety plan; Medication compliance and effectiveness             -- Discharge Goals: Return home with outpatient referrals for mental health follow-up including medication management/psychotherapy     Total Time Spent in Direct Patient Care:  I personally spent 35 minutes on the unit in direct patient care. The direct patient care time included face-to-face time with the patient, reviewing the patient's chart, communicating with other professionals, and coordinating care. Greater than 50% of this time was spent in counseling or coordinating care with the patient regarding goals of hospitalization, psycho-education, and discharge planning needs.   Maida Sale, MD 05/22/2022, 5:56 PM

## 2022-05-22 NOTE — Progress Notes (Signed)
Patient compliant with medications denies SI/HIA/A/VH and verbally contracted for safety. Attended all scheduled programming interacting well with Peers and staff. Patient remains safe.

## 2022-05-22 NOTE — Group Note (Signed)
[facility] LCSW Group Therapy Note  05/22/2022  11:15am-12:00pm  Type of Therapy and Topic:  Group Therapy:  Father's Day Discussion and Songs  Participation Level:  Active   Description of Group:   Patients in this group were asked to briefly describe their experience with the father figure(s) in their lives, both in childhood and adulthood.  Different types of support provided by these individuals were identified.   Patients were then encouraged to determine whether their father figure was or is a healthy or unhealthy support.  The manner in which that early relationship has shaped patient's feelings and life decisions was pointed out and acknowledged.  Group members gave support to each other.  Songs about fathers were then played, which patients enjoyed.  Therapeutic Goals: 1)  discuss the possibility of father figure(s) being positive and/or negative in one's life, normalizing that some people never had positive experiences with "paternal" persons  2)  describe patient's specific example of father figure(s), allowing time to vent  3)  listen to songs about father's for a therapeutic release of feelings   Summary of Patient Progress:  The patient expressed that she had a good relationship with her father and remains sad about him passing away in a nursing home 2 years ago from [place].  He was stern and taught her discipline.  She appreciated the music that was played and stated that the last song, which she requested, was also the last song in her father's funeral.  It actually made her very happy..   Therapeutic Modalities:   Processing Brief Solution-Focused Therapy  Maretta Los

## 2022-05-22 NOTE — Progress Notes (Signed)
Pt stated she was feeling much better, pt speech and cognitive ability has greatly improved. Pt stated she wanted to stay here much longer. Pt stated she was afraid when she goes home she will start drinking, pt encouraged to get outpatient help and groups to go to to help her manage her desires, 1:1 talking with Probation officer about ETOH abuse.    05/22/22 2200  Psych Admission Type (Psych Patients Only)  Admission Status Voluntary  Psychosocial Assessment  Patient Complaints Anxiety  Eye Contact Fair  Facial Expression Anxious  Affect Appropriate to circumstance  Speech Logical/coherent  Interaction Assertive  Motor Activity Slow  Appearance/Hygiene Unremarkable  Behavior Characteristics Cooperative  Mood Pleasant  Aggressive Behavior  Effect No apparent injury  Thought Process  Coherency Circumstantial  Content Blaming self  Delusions WDL  Perception WDL  Hallucination None reported or observed  Judgment Impaired  Confusion WDL  Danger to Self  Current suicidal ideation? Denies  Danger to Others  Danger to Others None reported or observed

## 2022-05-23 ENCOUNTER — Encounter (HOSPITAL_COMMUNITY): Payer: Self-pay

## 2022-05-23 DIAGNOSIS — F332 Major depressive disorder, recurrent severe without psychotic features: Secondary | ICD-10-CM

## 2022-05-23 MED ORDER — HYDROCORTISONE 1 % EX CREA
TOPICAL_CREAM | Freq: Two times a day (BID) | CUTANEOUS | Status: DC
  Filled 2022-05-23 (×2): qty 28

## 2022-05-23 MED ORDER — WHITE PETROLATUM EX OINT
TOPICAL_OINTMENT | CUTANEOUS | Status: AC
  Filled 2022-05-23: qty 5

## 2022-05-23 MED ORDER — MIRTAZAPINE 15 MG PO TABS
15.0000 mg | ORAL_TABLET | Freq: Every day | ORAL | Status: DC
  Administered 2022-05-23 – 2022-05-25 (×3): 15 mg via ORAL
  Filled 2022-05-23 (×6): qty 1

## 2022-05-23 NOTE — [facility] Group Notes (Signed)
Adult Psychoeducational Group Note  Date:  05/23/2022 Time:  8:37 PM  Group Topic/Focus:  Wrap-Up Group:   The focus of this group is to help patients review their daily goal of treatment and discuss progress on daily workbooks.  Participation Level:  Active  Participation Quality:  Attentive  Affect:  Appropriate  Cognitive:  Alert  Insight: Appropriate  Engagement in Group:  Engaged  Modes of Intervention:  Discussion  Additional Comments:  Patient attended and participated in the Wrap-Up group.   Annie Sable 05/23/2022, 8:37 PM

## 2022-05-23 NOTE — Progress Notes (Signed)
[facility] MD Progress Note  05/23/2022 4:35 PM [name]  MRN:  [id number]   Reason for Admission:  [name] is a 61 y.o. female with a history of depression and alcohol dependence, who was initially admitted for inpatient psychiatric hospitalization on 05/16/2022 for management of increased depression, alcohol use, alcohol withdrawals. The patient is currently on Hospital Day 7.   Chart Review from last 24 hours:  No as needed medication for agitation or aggression needed or given.  CIWA score 0. Visible and attending groups. Rash on LLE   Information Obtained Today During Patient Interview: Upon evaluation this afternoon, the patient is in good spirits, but anxious about a new rash. She states that she put on the yellow sox given to her by staff today. She develped a red, maculopaular rash shortly after. Its distracting and warm. She denies itching but I see her scratching at it. It doesn't look infectious. She continues to feel anxious, angry and depressed. She struggles with how to cope with this. She is sleeping okay and appetite is marginally better. No hallucinations, no thoughts of harm to self or others.   Sleep  Sleep: Improved, slept 8 hours   Principal Problem: MDD (major depressive disorder), recurrent severe, without psychosis ([place]) Diagnosis: Principal Problem:   MDD (major depressive disorder), recurrent severe, without psychosis ([place]) Active Problems:   Anxiety state   Alcohol dependence ([place])    Past Psychiatric History: Prior Psychiatric diagnoses: Depression and alcohol dependence Past Psychiatric Hospitalizations: "[facility]" notes was last admitted to psychiatric unit in November 2022 but unsure why   History of self mutilation: Not reported Past suicide attempts: Son reports history of suicide ideation but unsure if she had any attempts Past history of HI, violent or aggressive behavior: None reported   Past Psychiatric medications trials: Recently on Klonopin  and Ambien prescribed by outpatient psychiatrist, otherwise unknown History of ECT/TMS: None reported   Outpatient psychiatric Follow up: Sees outpatient psychiatric provider Dr. Robina Ade in [place]. Prior Outpatient Therapy: None reported  Past Medical History:  Past Medical History:  Diagnosis Date   Alcohol dependence ([place])    recent IVC at [facility] 10/ 2015 by son--  11-07-2014 pt states no alcohol since 09-11-2014 (approx)   Anxiety    Frequency of urination    GERD (gastroesophageal reflux disease)    Headache(784.0)    Hypertension    no meds per pt,  states bp been ok    Major depression    PTSD (post-traumatic stress disorder)    Renal calculus, right    Right ureteral stone    Urgency of urination     Past Surgical History:  Procedure Laterality Date   BILATERAL SALPINGECTOMY Bilateral 03/19/2014   Procedure: BILATERAL SALPINGECTOMY;  Surgeon: Lovenia Kim, MD;  Location: [facility];  Service: Gynecology;  Laterality: Bilateral;   CESAREAN SECTION  1991   HOLMIUM LASER APPLICATION Right 27/01/5365   Procedure: HOLMIUM LASER APPLICATION;  Surgeon: Alexis Frock, MD;  Location: [facility];  Service: Urology;  Laterality: Right;   ROBOTIC ASSISTED TOTAL HYSTERECTOMY N/A 03/19/2014   Procedure: ROBOTIC ASSISTED TOTAL HYSTERECTOMY;  Surgeon: Lovenia Kim, MD;  Location: [facility];  Service: Gynecology;  Laterality: N/A;   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   Family History:  Family History  Problem Relation Age of Onset   Alcohol abuse Father    Family Psychiatric  History: Psychiatric illness: Multiple  family members with depression including both sons, brother and mother Suicide: None reported Substance Abuse: Father and other family members were alcoholics   Social History: Living situation: Currently lives alone in [place]: Sons are supportive but they live in [place]  and [place] Marital Status: Was married once currently divorced Children: 2 sons, 1 lives in [place] and one lives in [place] Education: Unknown Employment: On Office manager service: None noted Legal history: None noted Trauma: Son reports patient has history of trauma growing up but unable to provide specific Access to guns: None noted  Sleep: Poor  Appetite:  Poor  Current Medications: Current Facility-Administered Medications  Medication Dose Route Frequency Provider Last Rate Last Admin   alum & mag hydroxide-simeth (MAALOX/MYLANTA) 200-200-20 MG/5ML suspension 30 mL  30 mL Oral Q4H PRN Ival Bible, MD       haloperidol (HALDOL) tablet 10 mg  10 mg Oral Q6H PRN Winfred Leeds, Nadir, MD   10 mg at 05/18/22 0002   And   LORazepam (ATIVAN) tablet 2 mg  2 mg Oral Q6H PRN Winfred Leeds, Nadir, MD   2 mg at 05/18/22 0001   And   benztropine (COGENTIN) tablet 1 mg  1 mg Oral Q6H PRN Winfred Leeds, Nadir, MD   1 mg at 05/18/22 1012   cefadroxil (DURICEF) capsule 500 mg  500 mg Oral BID Marylyn Ishihara, Tyrone A, DO   500 mg at 05/23/22 0815   FLUoxetine (PROZAC) capsule 20 mg  20 mg Oral Daily Attiah, Nadir, MD   20 mg at 05/23/22 0814   haloperidol (HALDOL) tablet 2 mg  2 mg Oral BID Maida Sale, MD   2 mg at 05/23/22 0815   hydrocortisone cream 1 %   Topical BID Meryn Sarracino, Jackie Plum, MD       hydrOXYzine (ATARAX) tablet 25 mg  25 mg Oral TID PRN Ival Bible, MD   25 mg at 05/20/22 0341   ibuprofen (ADVIL) tablet 400 mg  400 mg Oral Q6H PRN Ival Bible, MD   400 mg at 05/22/22 1123   LORazepam (ATIVAN) tablet 0.5 mg  0.5 mg Oral BID Attiah, Nadir, MD       magnesium hydroxide (MILK OF MAGNESIA) suspension 30 mL  30 mL Oral Daily PRN Ival Bible, MD   30 mL at 05/20/22 1738   metoprolol succinate (TOPROL-XL) 24 hr tablet 50 mg  50 mg Oral QHS Ival Bible, MD   50 mg at 05/22/22 2040   mirtazapine (REMERON) tablet 15 mg  15 mg Oral QHS Sun Kihn,  Jackie Plum, MD       multivitamin with minerals tablet 1 tablet  1 tablet Oral Daily Ival Bible, MD   1 tablet at 05/23/22 0817   pantoprazole (PROTONIX) EC tablet 40 mg  40 mg Oral Daily Ival Bible, MD   40 mg at 05/23/22 0817   polyethylene glycol (MIRALAX / GLYCOLAX) packet 17 g  17 g Oral Daily PRN Winfred Leeds, Nadir, MD   17 g at 05/21/22 0846   SUMAtriptan (IMITREX) tablet 50 mg  50 mg Oral Q2H PRN Ival Bible, MD       thiamine (B-1) injection 100 mg  100 mg Intramuscular Once Ival Bible, MD       thiamine tablet 100 mg  100 mg Oral Daily Ival Bible, MD   100 mg at 05/23/22 0817   traZODone (DESYREL) tablet 100 mg  100 mg Oral QHS  PRN Ival Bible, MD   100 mg at 05/22/22 2041    Lab Results:  Results for orders placed or performed during the hospital encounter of 05/16/22 (from the past 48 hour(s))  Comprehensive metabolic panel     Status: Abnormal   Collection Time: 05/22/22  6:55 AM  Result Value Ref Range   Sodium 138 135 - 145 mmol/L   Potassium 4.1 3.5 - 5.1 mmol/L   Chloride 103 98 - 111 mmol/L   CO2 28 22 - 32 mmol/L   Glucose, Bld 127 (H) 70 - 99 mg/dL    Comment: Glucose reference range applies only to samples taken after fasting for at least 8 hours.   BUN 12 6 - 20 mg/dL   Creatinine, Ser 0.72 0.44 - 1.00 mg/dL   Calcium 9.1 8.9 - 10.3 mg/dL   Total Protein 6.6 6.5 - 8.1 g/dL   Albumin 3.8 3.5 - 5.0 g/dL   AST 85 (H) 15 - 41 U/L   ALT 79 (H) 0 - 44 U/L   Alkaline Phosphatase 91 38 - 126 U/L   Total Bilirubin 1.3 (H) 0.3 - 1.2 mg/dL   GFR, Estimated >60 >60 mL/min    Comment: (NOTE) Calculated using the CKD-EPI Creatinine Equation (2021)    Anion gap 7 5 - 15    Comment: Performed at [facility], [place] [address] [place], [place] [address]  CBC with Differential/Platelet     Status: Abnormal   Collection Time: 05/22/22  6:55 AM  Result Value Ref Range   WBC 3.4 (L) 4.0 - 10.5 K/uL    RBC 3.76 (L) 3.87 - 5.11 MIL/uL   Hemoglobin 12.7 12.0 - 15.0 g/dL   HCT 38.3 36.0 - 46.0 %   MCV 101.9 (H) 80.0 - 100.0 fL   MCH 33.8 26.0 - 34.0 pg   MCHC 33.2 30.0 - 36.0 g/dL   RDW 15.6 (H) 11.5 - 15.5 %   Platelets 144 (L) 150 - 400 K/uL   nRBC 0.0 0.0 - 0.2 %   Neutrophils Relative % 37 %   Neutro Abs 1.3 (L) 1.7 - 7.7 K/uL   Lymphocytes Relative 43 %   Lymphs Abs 1.5 0.7 - 4.0 K/uL   Monocytes Relative 16 %   Monocytes Absolute 0.6 0.1 - 1.0 K/uL   Eosinophils Relative 2 %   Eosinophils Absolute 0.1 0.0 - 0.5 K/uL   Basophils Relative 1 %   Basophils Absolute 0.0 0.0 - 0.1 K/uL   Immature Granulocytes 1 %   Abs Immature Granulocytes 0.04 0.00 - 0.07 K/uL    Comment: Performed at [facility], [place] [address], [place], [place] [address]     Blood Alcohol level:  Lab Results  Component Value Date   ETH 309 [facility]) 04/07/2022   ETH 262 (H) 49/67/5916    Metabolic Disorder Labs: No results found for: "HGBA1C", "MPG" No results found for: "PROLACTIN" Lab Results  Component Value Date   CHOL 217 (H) 05/15/2022   TRIG 111 05/15/2022   HDL 96 05/15/2022   CHOLHDL 2.3 05/15/2022   VLDL 22 05/15/2022   LDLCALC 99 05/15/2022    Physical Findings: AIMS: Facial and Oral Movements Muscles of Facial Expression: None, normal Lips and Perioral Area: None, normal Jaw: None, normal Tongue: None, normal,Extremity Movements Upper (arms, wrists, hands, fingers): None, normal Lower (legs, knees, ankles, toes): None, normal, Trunk Movements Neck, shoulders, hips: None, normal, Overall Severity Severity of abnormal movements (highest score  from questions above): None, normal Incapacitation due to abnormal movements: None, normal Patient's awareness of abnormal movements (rate only patient's report): No Awareness, Dental Status Current problems with teeth and/or dentures?: No Does patient usually wear dentures?: No  CIWA:  CIWA-Ar Total: 2 COWS:      Musculoskeletal: Strength & Muscle Tone: within normal limits Gait & Station: unsteady Patient leans: N/A  Psychiatric Specialty Exam:  General Appearance: Appears in stated age, groomed   Behavior: Pleasant and cooperative.  Much less confused  Psychomotor Activity:normal  Eye Contact: fair Speech: normal Speech Volume: regular   Mood: anxious, depressed Affect: congruent  Thought Process: Concrete and linear  Descriptions of Associations: Intact Thought Content: Hallucinations: Denies AVH and does not present responding to stimuli Delusions: No paranoia ,delusions  Suicidal Thoughts: Denies SI intention plan Homicidal Thoughts: Denies HI, intention, plan   Alertness: Alert Orientation: Oriented to person place and time   Insight: Improving yet limited Judgment: Improving yet limited  Memory: fair  Executive Functions  Concentration: Limited Attention Span: Easily distracted Recall: fair Fund of Knowledge: fair    Assets  Assets:Communication Skills    Physical Exam: Physical Exam HENT:     Head: Normocephalic.     Comments: Right eye hematoma Eyes:     Extraocular Movements: Extraocular movements intact.  Musculoskeletal:     Cervical back: Normal range of motion.  Skin:    Findings: Rash present. Rash is macular and papular.          Comments: Maculopapular rash, non-crusting  Neurological:     General: No focal deficit present.     Mental Status: She is alert and oriented to person, place, and time.  Psychiatric:        Mood and Affect: Mood is anxious.        Speech: Speech normal.        Behavior: Behavior normal. Behavior is cooperative.        Thought Content: Thought content normal.        Cognition and Memory: Cognition normal.    Review of Systems  Constitutional:  Negative for chills and fever.  Respiratory:  Negative for cough.   Cardiovascular:  Negative for chest pain.  Gastrointestinal:  Negative for nausea and  vomiting.  Musculoskeletal:  Negative for myalgias.  Skin:  Positive for rash.  Psychiatric/Behavioral:  Positive for depression. Negative for hallucinations and suicidal ideas. The patient is nervous/anxious. The patient does not have insomnia.    Blood pressure 132/87, pulse 85, temperature 98.3 F (36.8 C), temperature source Oral, resp. rate 16, height '5\' 1"'$  (1.549 m), weight 66.7 kg, last menstrual period 02/09/2014, SpO2 93 %. Body mass index is 27.78 kg/m.   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 Medications:             Taper down Ativan from 2 mg 4 times daily to 2 mg 3 times daily for alcohol withdrawal, continue to monitor and taper off gradually to stop completely in the next few days (ativan taper orders entered).             Continue to monitor CIWA score            reduced haldol '2mg'$  PO BID             Continue Haldol Ativan Cogentin as needed for agitation             stop Elavil - worsening constipation. Start remeron for sleep and  appetite.   Continue Prozac '20mg'$  for depression, monitor effects and safety --  The risks/benefits/side-effects/alternatives to this medication were discussed in detail with the patient and time was given for questions. The patient consents to medication trial.                -- Metabolic profile and EKG monitoring obtained while on an atypical antipsychotic (BMI: Lipid Panel: HbgA1c: QTc:)              Continue Duricef antibiotic for UTI             Continue home medications for and GERD Cortisone 1% LLE for contact dermititis  Consider restarting Lipitor as liver function continues to improve, was on Lipitor at home was discontinued this admission secondary to elevated liver enzymes.  Continue monitoring as patient's cognition improved to reassess depression and dementia can start medications".                           3. Pertinent labs: elevated liver function test 05/24/2021 but going off since then.  Also low platelet count, had normal platelets in 2017 and has been declining gradually since November 2022, recently 68 in emergency room prior to admission.  UA suggestive of UTI, EKG in the emergency room QTc 415 Head CT indicated brain atrophy and small vessel disease in addition to scalp hematoma related to fall last night 6/13      Lab ordered: Continue to follow CBC and liver function test.    4.  Hospitalist consult was requested to assist with management of UTI, elevated liver enzymes, low platelet count, will follow.                 5. Group and Therapy: -- Encouraged patient to participate in unit milieu and in scheduled group therapies                           As patient improves cognitively we will address options for treatment for alcohol dependence including inpatient residential rehab versus outpatient rehab for alcohol dependence, limitations with dependence on insurance coverage as well as patient insight and willingness for treatment.                 -- Short Term Goals:  Ability to identify  changes in lifestyle to reduce recurrence of condition will improve, Ability to verbalize feelings will improve, Ability to disclose and discuss suicidal ideas, and Ability to demonstrate self-control will improve             -- Long Term Goals: Improvement in symptoms so as ready for discharge   6. Discharge Planning:              -- Social work and case management to assist with discharge planning and identification of hospital follow-up needs prior to discharge             -- Estimated LOS: 5-7 days             -- Discharge Concerns: Need to establish a safety plan; Medication compliance and effectiveness             -- Discharge Goals: Return home with outpatient referrals for mental health follow-up including medication management/psychotherapy     Total Time Spent in Direct Patient Care:  I personally spent 35 minutes on the unit in direct patient care. The direct patient care time included face-to-face time with the patient, reviewing the patient's chart, communicating with other professionals, and coordinating care. Greater than 50% of this time was spent in counseling or coordinating care with the patient regarding goals of hospitalization, psycho-education, and discharge planning needs.   Maida Sale, MD 05/23/2022, 4:35 PM

## 2022-05-23 NOTE — BHH Counselor (Signed)
CSW met with patient regarding discharge planning and next steps for follow up care.  Patient continues to decline residential treatment but was open to learning more about the programs that she can possibly be referred to.  Patient at this time is interested in [facility].  CSW provided information about [facility].    Makeya Hilgert, LCSW, [place] Social Worker  [facility]

## 2022-05-23 NOTE — BHH Counselor (Signed)
CSW spoke with patient son, Lysbeth Galas.  CSW informed patient son that patient is currently declining residential treatment for substance use but has agreed to intensive outpatient.  Son continues to believe that patient needs residential but understanding that she would need to agree.  Son would like to be notified when we have a discharge date for patient.  Sons plan to continue to encourage patient to agree to referrals for inpatient/residential treatment.  CSW also provided education about IVC process and how to get help for patient if they feel she is a danger to herself or others.    Silvano Garofano, LCSW, [place] Social Worker  [facility]

## 2022-05-23 NOTE — Progress Notes (Signed)
    05/23/22 1000  Psych Admission Type (Psych Patients Only)  Admission Status Voluntary  Psychosocial Assessment  Eye Contact Fair  Facial Expression Anxious  Affect Appropriate to circumstance  Speech Logical/coherent  Interaction Assertive  Motor Activity Slow  Appearance/Hygiene Unremarkable  Behavior Characteristics Cooperative  Mood Pleasant  Thought Process  Coherency Circumstantial  Delusions WDL  Perception WDL  Hallucination None reported or observed  Judgment Impaired  Confusion WDL  Danger to Self  Current suicidal ideation? Denies  Danger to Others  Danger to Others None reported or observed  Danger to Others Abnormal  Harmful Behavior to others No threats or harm toward other people

## 2022-05-23 NOTE — Group Note (Signed)
Recreation Therapy Group Note   Group Topic:Problem Solving  Group Date: 05/23/2022 Start Time: 1005 End Time: 1040 Facilitators: Victorino Sparrow, LRT,CTRS Location: [address] Dayroom   Goal Area(s) Addresses:  Patient will effectively work with peer towards shared goal.  Patient will identify skills used to make activity successful.  Patient will share challenges and verbalize solution-driven approaches used. Patient will identify how skills used during activity can be used to reach post d/c goals.   Group Description:  [employer]. Patients were provided the following materials: 2 drinking straws, 5 rubber bands, 5 paper clips, 2 index cards and 2 drinking cups. Using the provided materials patients were asked to build a launching mechanism to launch a ping pong ball across the room, approximately 10 feet. Patients were divided into teams of 3-5. Instructions required all materials be incorporated into the device, functionality of items left to the peer group's discretion.   Affect/Mood: Appropriate   Participation Level: Engaged   Participation Quality: Independent   Behavior: Appropriate   Speech/Thought Process: Focused   Insight: Good   Judgement: Good   Modes of Intervention: Problem-solving   Patient Response to Interventions:  Engaged   Education Outcome:  Acknowledges education and In group clarification offered    Clinical Observations/Individualized Feedback: Pt was engaged and really into coming up with a concept with her partner.  Pt and partner took turns sharing ideas with each other and working them to see what was the best option.  Pt was patient and attentive in completing the launcher.  Pt expressed the hardest part was having so many materials to use but on the flip side, pt stated the materials were light weight which made it easier to use them.     Plan: Continue to engage patient in RT group sessions 2-3x/week.   Victorino Sparrow,  Glennis Brink 05/23/2022 12:54 PM

## 2022-05-23 NOTE — Plan of Care (Signed)
  Problem: Education: Goal: Emotional status will improve Outcome: Progressing Goal: Mental status will improve Outcome: Progressing Goal: Verbalization of understanding the information provided will improve Outcome: Progressing   Problem: Activity: Goal: Interest or engagement in activities will improve Outcome: Progressing Goal: Sleeping patterns will improve Outcome: Progressing

## 2022-05-23 NOTE — BH IP Treatment Plan (Signed)
Interdisciplinary Treatment and Diagnostic Plan Update  05/23/2022 Time of Session: 0830  [name] MRN: [id number]  Principal Diagnosis: MDD (major depressive disorder), recurrent severe, without psychosis ([place])  Secondary Diagnoses: Principal Problem:   MDD (major depressive disorder), recurrent severe, without psychosis ([place]) Active Problems:   Anxiety state   Alcohol dependence ([place])   Current Medications:  Current Facility-Administered Medications  Medication Dose Route Frequency Provider Last Rate Last Admin   alum & mag hydroxide-simeth (MAALOX/MYLANTA) 200-200-20 MG/5ML suspension 30 mL  30 mL Oral Q4H PRN Ival Bible, MD       haloperidol (HALDOL) tablet 10 mg  10 mg Oral Q6H PRN Winfred Leeds, Nadir, MD   10 mg at 05/18/22 0002   And   LORazepam (ATIVAN) tablet 2 mg  2 mg Oral Q6H PRN Winfred Leeds, Nadir, MD   2 mg at 05/18/22 0001   And   benztropine (COGENTIN) tablet 1 mg  1 mg Oral Q6H PRN Winfred Leeds, Nadir, MD   1 mg at 05/18/22 1012   cefadroxil (DURICEF) capsule 500 mg  500 mg Oral BID Marylyn Ishihara, Tyrone A, DO   500 mg at 05/23/22 0815   FLUoxetine (PROZAC) capsule 20 mg  20 mg Oral Daily Attiah, Nadir, MD   20 mg at 05/23/22 0814   haloperidol (HALDOL) tablet 2 mg  2 mg Oral BID Maida Sale, MD   2 mg at 05/23/22 0815   hydrOXYzine (ATARAX) tablet 25 mg  25 mg Oral TID PRN Ival Bible, MD   25 mg at 05/20/22 0341   ibuprofen (ADVIL) tablet 400 mg  400 mg Oral Q6H PRN Ival Bible, MD   400 mg at 05/22/22 1123   LORazepam (ATIVAN) tablet 0.5 mg  0.5 mg Oral TID Winfred Leeds, Nadir, MD   0.5 mg at 05/23/22 0816   Followed by   LORazepam (ATIVAN) tablet 0.5 mg  0.5 mg Oral BID Attiah, Nadir, MD       magnesium hydroxide (MILK OF MAGNESIA) suspension 30 mL  30 mL Oral Daily PRN Ival Bible, MD   30 mL at 05/20/22 1738   metoprolol succinate (TOPROL-XL) 24 hr tablet 50 mg  50 mg Oral QHS Ival Bible, MD   50 mg at 05/22/22 2040   mirtazapine  (REMERON) tablet 15 mg  15 mg Oral QHS PRN Maida Sale, MD   15 mg at 05/23/22 0027   multivitamin with minerals tablet 1 tablet  1 tablet Oral Daily Ival Bible, MD   1 tablet at 05/23/22 0817   pantoprazole (PROTONIX) EC tablet 40 mg  40 mg Oral Daily Ival Bible, MD   40 mg at 05/23/22 0817   polyethylene glycol (MIRALAX / GLYCOLAX) packet 17 g  17 g Oral Daily PRN Winfred Leeds, Nadir, MD   17 g at 05/21/22 0846   SUMAtriptan (IMITREX) tablet 50 mg  50 mg Oral Q2H PRN Ival Bible, MD       thiamine (B-1) injection 100 mg  100 mg Intramuscular Once Ival Bible, MD       thiamine tablet 100 mg  100 mg Oral Daily Ival Bible, MD   100 mg at 05/23/22 0817   traZODone (DESYREL) tablet 100 mg  100 mg Oral QHS PRN Ival Bible, MD   100 mg at 05/22/22 2041   PTA Medications: Medications Prior to Admission  Medication Sig Dispense Refill Last Dose   amitriptyline (ELAVIL) 25 MG tablet Take 50  mg by mouth at bedtime.      atorvastatin (LIPITOR) 40 MG tablet Take 40 mg by mouth at bedtime.      esomeprazole (NEXIUM) 20 MG capsule Take 20 mg by mouth daily as needed (acid reflux/indigestion).      estradiol (VIVELLE-DOT) 0.0375 MG/24HR Place 1 patch onto the skin 2 (two) times a week. Tuesday and Thursday      LORazepam (ATIVAN) 1 MG tablet Take 1 tablet (1 mg total) by mouth daily. Continue taper 6/12 take 1 mg at 1600 and 2200 6/13 take 1 mg at 1000 and 2200 6/14 take 1 mg at 1000 6/15 take 1 mg at 1000 30 tablet 0    metoprolol succinate (TOPROL-XL) 50 MG 24 hr tablet Take 50 mg by mouth at bedtime.      rizatriptan (MAXALT) 5 MG tablet Take 10 mg by mouth See admin instructions. Take 2 tablets (10 mg) by mouth at onset of migraine, may repeat in 2 hours if still needed       Patient Stressors: Health problems   Marital or family conflict   Medication change or noncompliance   Substance abuse    Patient Strengths: Ability for insight   [employer] logistics/support/administrative officer  Motivation for treatment/growth  Supportive family/friends   Treatment Modalities: Medication Management, Group therapy, Case management,  1 to 1 session with clinician, Psychoeducation, Recreational therapy.   Physician Treatment Plan for Primary Diagnosis: MDD (major depressive disorder), recurrent severe, without psychosis ([place]) Long Term Goal(s): Improvement in symptoms so as ready for discharge   Short Term Goals: Ability to identify changes in lifestyle to reduce recurrence of condition will improve Ability to verbalize feelings will improve Ability to disclose and discuss suicidal ideas Ability to demonstrate self-control will improve  Medication Management: Evaluate patient's response, side effects, and tolerance of medication regimen.  Therapeutic Interventions: 1 to 1 sessions, Unit Group sessions and Medication administration.  Evaluation of Outcomes: Progressing  Physician Treatment Plan for Secondary Diagnosis: Principal Problem:   MDD (major depressive disorder), recurrent severe, without psychosis ([place]) Active Problems:   Anxiety state   Alcohol dependence ([place])  Long Term Goal(s): Improvement in symptoms so as ready for discharge   Short Term Goals: Ability to identify changes in lifestyle to reduce recurrence of condition will improve Ability to verbalize feelings will improve Ability to disclose and discuss suicidal ideas Ability to demonstrate self-control will improve     Medication Management: Evaluate patient's response, side effects, and tolerance of medication regimen.  Therapeutic Interventions: 1 to 1 sessions, Unit Group sessions and Medication administration.  Evaluation of Outcomes: Progressing   RN Treatment Plan for Primary Diagnosis: MDD (major depressive disorder), recurrent severe, without psychosis ([place]) Long Term Goal(s): Knowledge of disease and therapeutic regimen to maintain health will improve  Short Term  Goals: Ability to remain free from injury will improve, Ability to verbalize frustration and anger appropriately will improve, Ability to demonstrate self-control, Ability to participate in decision making will improve, Ability to verbalize feelings will improve, Ability to disclose and discuss suicidal ideas, Ability to identify and develop effective coping behaviors will improve, and Compliance with prescribed medications will improve  Medication Management: RN will administer medications as ordered by provider, will assess and evaluate patient's response and provide education to patient for prescribed medication. RN will report any adverse and/or side effects to prescribing provider.  Therapeutic Interventions: 1 on 1 counseling sessions, Psychoeducation, Medication administration, Evaluate responses to treatment, Monitor vital signs and CBGs as ordered,  Perform/monitor CIWA, COWS, AIMS and Fall Risk screenings as ordered, Perform wound care treatments as ordered.  Evaluation of Outcomes: Progressing   LCSW Treatment Plan for Primary Diagnosis: MDD (major depressive disorder), recurrent severe, without psychosis ([place]) Long Term Goal(s): Safe transition to appropriate next level of care at discharge, Engage patient in therapeutic group addressing interpersonal concerns.  Short Term Goals: Engage patient in aftercare planning with referrals and resources, Increase social support, Increase ability to appropriately verbalize feelings, Increase emotional regulation, Facilitate acceptance of mental health diagnosis and concerns, Facilitate patient progression through stages of change regarding substance use diagnoses and concerns, Identify triggers associated with mental health/substance abuse issues, and Increase skills for wellness and recovery  Therapeutic Interventions: Assess for all discharge needs, 1 to 1 time with Social worker, Explore available resources and support systems, Assess for adequacy  in community support network, Educate family and significant other(s) on suicide prevention, Complete Psychosocial Assessment, Interpersonal group therapy.  Evaluation of Outcomes: Progressing   Progress in Treatment: Attending groups: Yes. Participating in groups: Yes. Taking medication as prescribed: Yes. Toleration medication: Yes. Family/Significant other contact made: Yes, individual(s) contacted:  SPE completed with patient and [name], son Patient understands diagnosis: No. Discussing patient identified problems/goals with staff: Yes. Medical problems stabilized or resolved: Yes. Denies suicidal/homicidal ideation: Yes. Issues/concerns per patient self-inventory: Yes. Other: none  New problem(s) identified: No, Describe:  none   New Short Term/Long Term Goal(s): Patient to work towards detox, elimination of symptoms of psychosis, medication management for mood stabilization; elimination of SI thoughts; development of comprehensive mental wellness/sobriety plan.  Patient Goals:  No additional goals identified at this time. Patient to continue to work towards original goals identified in initial treatment team meeting. CSW will remain available to patient should they voice additional treatment goals.   Discharge Plan or Barriers: No psychosocial barriers identified at this time, patient to return to place of residence when appropriate for discharge.   Reason for Continuation of Hospitalization: Other; describe Psychosis  Estimated Length of Stay: 1-7 days    Last [facility] 2/9 Scores:    05/15/2022    1:39 PM  Depression screen PHQ 2/9  Decreased Interest 2  Down, Depressed, Hopeless 2  PHQ - 2 Score 4  Altered sleeping 3  Tired, decreased energy 2  Change in appetite 0  Feeling bad or failure about yourself  2  Trouble concentrating 2  Moving slowly or fidgety/restless 2  Suicidal thoughts 3  PHQ-9 Score 18  Difficult doing work/chores Somewhat difficult    Scribe for  Treatment Team: Larose Kells 05/23/2022 12:24 PM

## 2022-05-23 NOTE — Progress Notes (Signed)
   05/23/22 0500  Sleep  Number of Hours 7

## 2022-05-23 NOTE — Group Note (Signed)
LCSW Group Therapy Note  Group Date: 05/23/2022 Start Time: 1300 End Time: 1400   Type of Therapy and Topic:  Group Therapy - How To Cope with Nervousness about Discharge   Participation Level:  Active   Description of Group This process group involved identification of patients' feelings about discharge. Some of them are scheduled to be discharged soon, while others are new admissions, but each of them was asked to share thoughts and feelings surrounding discharge from the hospital. One common theme was that they are excited at the prospect of going home, while another was that many of them are apprehensive about sharing why they were hospitalized. Patients were given the opportunity to discuss these feelings with their peers in preparation for discharge.  Therapeutic Goals  Patient will identify their overall feelings about pending discharge. Patient will think about how they might proactively address issues that they believe will once again arise once they get home (i.e. with parents). Patients will participate in discussion about having hope for change.   Summary of Patient Progress:  Veora was very active throughout the session. She demonstrated good insight into the subject matter, and proved open to input from peers and feedback from [place]. She was respectful of peers and participated throughout the entire session. Patient discussed feeling determined after discharge and was excited to prove to her support system that she can maintain her sobriety.    Therapeutic Modalities Cognitive Behavioral Therapy   Zachery Conch, LCSW 05/23/2022  1:56 PM

## 2022-05-23 NOTE — Progress Notes (Signed)
      05/23/22 2115  Psych Admission Type (Psych Patients Only)  Admission Status Voluntary  Psychosocial Assessment  Patient Complaints Anxiety  Eye Contact Fair  Facial Expression Anxious  Affect Appropriate to circumstance  Speech Logical/coherent  Interaction Assertive  Motor Activity Slow  Appearance/Hygiene Unremarkable  Behavior Characteristics Cooperative  Mood Pleasant  Thought Process  Coherency Circumstantial  Content Blaming self  Delusions WDL  Perception WDL  Hallucination None reported or observed  Judgment Impaired  Confusion WDL  Danger to Self  Current suicidal ideation? Denies  Agreement Not to Harm Self Yes  Description of Agreement verbal contract for safety  Danger to Others  Danger to Others None reported or observed  Danger to Others Abnormal  Harmful Behavior to others No threats or harm toward other people  Destructive Behavior No threats or harm toward property

## 2022-05-24 MED ORDER — HALOPERIDOL 2 MG PO TABS
2.0000 mg | ORAL_TABLET | Freq: Every day | ORAL | Status: DC
Start: 2022-05-25 — End: 2022-05-24

## 2022-05-24 MED ORDER — HALOPERIDOL 2 MG PO TABS
2.0000 mg | ORAL_TABLET | Freq: Two times a day (BID) | ORAL | Status: DC
  Administered 2022-05-24 – 2022-05-26 (×4): 2 mg via ORAL
  Filled 2022-05-24 (×8): qty 1

## 2022-05-24 MED ORDER — LIDOCAINE 5 % EX PTCH
1.0000 | MEDICATED_PATCH | CUTANEOUS | Status: DC
Start: 2022-05-24 — End: 2022-05-26
  Administered 2022-05-24: 1 via TRANSDERMAL
  Filled 2022-05-24 (×4): qty 1

## 2022-05-24 MED ORDER — IBUPROFEN 600 MG PO TABS: 600.0000 mg | ORAL_TABLET | Freq: Four times a day (QID) | ORAL | Status: DC | PRN

## 2022-05-24 MED ORDER — LORAZEPAM 0.5 MG PO TABS
0.5000 mg | ORAL_TABLET | Freq: Every day | ORAL | Status: AC
  Administered 2022-05-25: 0.5 mg via ORAL
  Filled 2022-05-24: qty 1

## 2022-05-24 MED ORDER — CEFADROXIL 500 MG PO CAPS
500.0000 mg | ORAL_CAPSULE | Freq: Two times a day (BID) | ORAL | Status: AC
  Administered 2022-05-24: 500 mg via ORAL
  Filled 2022-05-24: qty 1

## 2022-05-24 MED ORDER — HALOPERIDOL 2 MG PO TABS
2.0000 mg | ORAL_TABLET | Freq: Every day | ORAL | Status: DC
  Filled 2022-05-24 (×2): qty 1

## 2022-05-24 MED ORDER — ACETAMINOPHEN 500 MG PO TABS
500.0000 mg | ORAL_TABLET | Freq: Four times a day (QID) | ORAL | Status: DC | PRN
  Administered 2022-05-24: 500 mg via ORAL
  Filled 2022-05-24: qty 1

## 2022-05-24 NOTE — Plan of Care (Signed)
I called and spoke with the patient's son [name]  [place] [phone] ) with patient's verbal consent to talk with son. Son had questions about her medical treatment and lab results. I reviewed her diagnosis of leukopenia, thrombocytopenia, recent UTI, thyroid mass noted on Head CT, hyperlipidemia, and sciatica. He thinks she was not only drinking but using Tylenol PM for sleep prior to admission. I discussed need to abstain from alcohol and Tylenol given LFT elevation and reviewed the results of her liver US. I advised that she will need to see PCP after discharge for continued monitoring of her labs and for scheduling of [place] of her thyroid. I reviewed her current medications and the need to continue slow taper off Haldol after discharge to avoid rebound psychosis. We discussed that she likely had an alcohol and UTI related delirium that has resolved. I made him aware of her Prozac and Remeron for anxiety/depressive symptoms and the need to watch for SI and weight gain while on these meds. Time was given for questions.   Viann Fish, MD, [facility]

## 2022-05-24 NOTE — Progress Notes (Signed)
Psychoeducational Group Note  Date:  05/24/2022 Time:  2015  Group Topic/Focus:  Wrap up group  Participation Level: Did Not Attend  Participation Quality:  Not Applicable  Affect:  Not Applicable  Cognitive:  Not Applicable  Insight:  Not Applicable  Engagement in Group: Not Applicable  Additional Comments:  Did not attend.    Shellia Cleverly 05/24/2022, 9:19 PM

## 2022-05-24 NOTE — Progress Notes (Addendum)
[facility] MD Progress Note  05/24/2022 11:50 AM [name]  MRN:  [id number]  Chief Complaint: alcohol abuse; depression  Reason for Admission:  [name] is a 61 y.o., female with a past psychiatric history significant for depression and alcohol dependence who presented to the [facility] from [facility] emergency room for evaluation and management of alcohol intoxication and substance-induced mood disorder. On arrival to the unit, she was agitated, psychotic, and responding to internal stimuli. The patient is currently on Hospital Day 8.   Chart Review from last 24 hours:  The patient's chart was reviewed and nursing notes were reviewed. The patient's case was discussed in multidisciplinary team meeting. Per nursing, she attended groups and had no acute behavioral issues or safety concerns noted. Per MAR she was compliant with scheduled medications and did receive Vistaril X1 for anxiety and Trazodone X1 for sleep. She received Imitrex X1 overnight for HA.  Information Obtained Today During Patient Interview: The patient was seen and evaluated on the unit. On assessment today the patient reports that any confusion, disorientation, or agitation she had on admission has resolved. She attributes these symptoms on admission to resolving issues with alcohol abuse. She denies current signs of withdrawal or cravings for alcohol. She is looking forward to [facility] after discharge but does not want residential rehab. She denies SI, HI, AVH, paranoia, ideas of reference, first rank symptoms, or magical thinking. She c/o sciatica pain in her right buttock radiating down her leg after sitting on the hard chairs in the dayroom. She states she usually takes percocet for this but I advised that given her addiction history we could offer Motrin, Tylenol, ice packs, and Lidoderm patch. We could consider trial of Neurontin if symptoms persist. She states she is tolerating the Prozac well without  side-effects and feels her mood is improved and less depressed. We discussed that she is completing an Ativan taper and is currently on Haldol due to recent delirium/agitation. We discussed potentially trying to taper down on Haldol as tolerated now that her psychosis and agitation have resolved. She reports HA last night and mild diarrhea after drinking apple juice. We also discussed moving her off 500 hall to less acute unit when a bed is open.She has been showering and reports stable sleep and appetite.  Principal Problem: MDD (major depressive disorder), recurrent severe, without psychosis ([place]) Diagnosis: Active Problems:   Alcohol dependence ([place])   Substance induced mood disorder ([place])  Total Time Spent in Direct Patient Care:  I personally spent 30 minutes on the unit in direct patient care. The direct patient care time included face-to-face time with the patient, reviewing the patient's chart, communicating with other professionals, and coordinating care. Greater than 50% of this time was spent in counseling or coordinating care with the patient regarding goals of hospitalization, psycho-education, and discharge planning needs.   Past Psychiatric History: see H&P  Past Medical History:  Past Medical History:  Diagnosis Date   Alcohol dependence ([place])    recent IVC at [facility] 10/ 2015 by son--  11-07-2014 pt states no alcohol since 09-11-2014 (approx)   Anxiety    Frequency of urination    GERD (gastroesophageal reflux disease)    Headache(784.0)    Hypertension    no meds per pt,  states bp been ok    Major depression    PTSD (post-traumatic stress disorder)    Renal calculus, right    Right ureteral stone    Urgency of urination  Past Surgical History:  Procedure Laterality Date   BILATERAL SALPINGECTOMY Bilateral 03/19/2014   Procedure: BILATERAL SALPINGECTOMY;  Surgeon: Lovenia Kim, MD;  Location: [facility];  Service: Gynecology;  Laterality: Bilateral;   CESAREAN  SECTION  1991   HOLMIUM LASER APPLICATION Right 96/01/8365   Procedure: HOLMIUM LASER APPLICATION;  Surgeon: Alexis Frock, MD;  Location: [facility];  Service: Urology;  Laterality: Right;   ROBOTIC ASSISTED TOTAL HYSTERECTOMY N/A 03/19/2014   Procedure: ROBOTIC ASSISTED TOTAL HYSTERECTOMY;  Surgeon: Lovenia Kim, MD;  Location: [facility];  Service: Gynecology;  Laterality: N/A;   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 09-11-2014)     Social History   Substance and Sexual Activity  Drug Use Yes   Types: Marijuana, Benzodiazepines   Comment: last marjuana 2013/  ambien dependence    Social History   Socioeconomic History   Marital status: Divorced    Spouse name: Not on file   Number of children: Not on file   Years of education: Not on file   Highest education level: Not on file  Occupational History   Not on file  Tobacco Use   Smoking status: Former    Types: Cigarettes    Quit date: 09/11/2014    Years since quitting: 7.7   Smokeless tobacco: Never  Substance and Sexual Activity   Alcohol use: Yes    Alcohol/week: 20.0 standard drinks of alcohol    Types: 10 Glasses of wine, 10 Shots of liquor per week    Comment: last alcohol 55 days ago (approx. 09-11-2014)   Drug use: Yes    Types: Marijuana, Benzodiazepines    Comment: last marjuana 2013/  ambien dependence   Sexual activity: Not on file  Other Topics Concern   Not on file  Social History Narrative   Not on file   Social Determinants of Health   Financial Resource  Strain: Not on file  Food Insecurity: Not on file  Transportation Needs: Not on file  Physical Activity: Not on file  Stress: Not on file  Social Connections: Not on file    Sleep: Good  Appetite:  Good  Current Medications: Current Facility-Administered Medications  Medication Dose Route Frequency Provider Last Rate Last Admin   alum & mag hydroxide-simeth (MAALOX/MYLANTA) 200-200-20 MG/5ML suspension 30 mL  30 mL Oral Q4H PRN Ival Bible, MD       haloperidol (HALDOL) tablet 10 mg  10 mg Oral Q6H PRN Winfred Leeds, Nadir, MD   10 mg at 05/18/22 0002   And   LORazepam (ATIVAN) tablet 2 mg  2 mg Oral Q6H PRN Winfred Leeds, Nadir, MD   2 mg at 05/18/22 0001   And   benztropine (COGENTIN) tablet 1 mg  1 mg Oral Q6H PRN Winfred Leeds, Nadir, MD   1 mg at 05/18/22 1012   cefadroxil (DURICEF) capsule 500 mg  500 mg Oral BID Nelda Marseille, Darcel Frane E, MD       FLUoxetine (PROZAC) capsule 20 mg  20 mg Oral Daily  Dian Situ, MD   20 mg at 05/24/22 0757   haloperidol (HALDOL) tablet 2 mg  2 mg Oral BID Maida Sale, MD   2 mg at 05/24/22 0757   hydrocortisone cream 1 %   Topical BID Maida Sale, MD   Given at 05/24/22 0758   hydrOXYzine (ATARAX) tablet 25 mg  25 mg Oral TID PRN Ival Bible, MD   25 mg at 05/23/22 2216   ibuprofen (ADVIL) tablet 400 mg  400 mg Oral Q6H PRN Ival Bible, MD   400 mg at 05/22/22 1123   magnesium hydroxide (MILK OF MAGNESIA) suspension 30 mL  30 mL Oral Daily PRN Ival Bible, MD   30 mL at 05/20/22 1738   metoprolol succinate (TOPROL-XL) 24 hr tablet 50 mg  50 mg Oral QHS Ival Bible, MD   50 mg at 05/23/22 2051   mirtazapine (REMERON) tablet 15 mg  15 mg Oral QHS Hill, Jackie Plum, MD   15 mg at 05/23/22 2051   multivitamin with minerals tablet 1 tablet  1 tablet Oral Daily Ival Bible, MD   1 tablet at 05/24/22 0758   pantoprazole (PROTONIX) EC tablet 40 mg  40 mg Oral Daily Ival Bible, MD   40 mg at  05/24/22 0757   polyethylene glycol (MIRALAX / GLYCOLAX) packet 17 g  17 g Oral Daily PRN Winfred Leeds, Nadir, MD   17 g at 05/21/22 0846   SUMAtriptan (IMITREX) tablet 50 mg  50 mg Oral Q2H PRN Ival Bible, MD   50 mg at 05/24/22 0009   thiamine (B-1) injection 100 mg  100 mg Intramuscular Once Ival Bible, MD       thiamine tablet 100 mg  100 mg Oral Daily Ival Bible, MD   100 mg at 05/24/22 0757   traZODone (DESYREL) tablet 100 mg  100 mg Oral QHS PRN Ival Bible, MD   100 mg at 05/23/22 2052    Lab Results: No results found for this or any previous visit (from the past 71 hour(s)).  Blood Alcohol level:  Lab Results  Component Value Date   ETH 309 (HH) 04/07/2022   ETH 262 (H) 29/93/7169    Metabolic Disorder Labs: No results found for: "HGBA1C", "MPG" No results found for: "PROLACTIN" Lab Results  Component Value Date   CHOL 217 (H) 05/15/2022   TRIG 111 05/15/2022   HDL 96 05/15/2022   CHOLHDL 2.3 05/15/2022   VLDL 22 05/15/2022   LDLCALC 99 05/15/2022    Physical Findings: AIMS: Facial and Oral Movements Muscles of Facial Expression: None, normal Lips and Perioral Area: None, normal Jaw: None, normal Tongue: None, normal,Extremity Movements Upper (arms, wrists, hands, fingers): None, normal Lower (legs, knees, ankles, toes): None, normal, Trunk Movements Neck, shoulders, hips: None, normal, Overall Severity Severity of abnormal movements (highest score from questions above): None, normal Incapacitation due to abnormal movements: None, normal Patient's awareness of abnormal movements (rate only patient's report): No Awareness, Dental Status Current problems with teeth and/or dentures?: No Does patient usually wear dentures?: No  CIWA:  CIWA-Ar Total: 3  Musculoskeletal: Strength & Muscle Tone: within normal limits Gait & Station: normal Patient leans: N/A  Psychiatric Specialty Exam:  Presentation  General Appearance: casually  dressed, adequate hygiene  Eye Contact:Good  Speech:normal rate and fluency  Speech Volume:Normal  Mood and Affect  Mood:described as improved - appears euthymic  Affect:moderate, stable   Thought Process  Thought Processes:Linear, goal directed  Descriptions of Associations:Intact  Orientation:Full (Time, Place and Person)  Thought Content:Denies AVH, paranoia, ideas of reference, first rank symptoms; no delusions noted; denies SI or HI  Hallucinations:Hallucinations: None  Ideas of Reference:None  Suicidal Thoughts:Suicidal Thoughts: No  Homicidal Thoughts:Homicidal Thoughts: No   Sensorium  Memory:Immediate Fair; Remote Fair  Judgment:Fair  Insight:Fair   Executive Functions  Concentration:Fair  Attention Span:Fair  [place]  Language:Good   Psychomotor Activity  Psychomotor Activity:No cogwheeling, no stiffness, no tremor; AIMS 0   Assets  Assets:Communication Skills   Sleep  Sleep:5 hours   Physical Exam Vitals and nursing note reviewed.  Constitutional:      Appearance: Normal appearance.  HENT:     Head: Normocephalic.     Comments: Resolving bruise around right eye Pulmonary:     Effort: Pulmonary effort is normal.  Neurological:     General: No focal deficit present.     Mental Status: She is alert.    Review of Systems  Respiratory:  Negative for shortness of breath.   Cardiovascular:  Negative for chest pain.  Gastrointestinal:  Positive for diarrhea. Negative for nausea and vomiting.  Musculoskeletal:        Right sciatica pain  Neurological:  Positive for headaches.   Blood pressure 119/85, pulse 80, temperature 98.6 F (37 C), temperature source Oral, resp. rate 14, height '5\' 1"'$  (1.549 m), weight 66.7 kg, last menstrual period 02/09/2014, SpO2 93 %. Body mass index is 27.78 kg/m.   Treatment Plan Summary: Diagnoses / Active Problems: Delirium resolved (likely due to alcohol abuse and  UTI) Substance induced mood d/o (r/o MDD) Alcohol use d/o UTI Contact dermatitis Thrombocytopenia Transaminitis  PLAN: Safety and Monitoring:  -- Involuntary Admission to inpatient psychiatric unit for safety, stabilization and treatment  -- Daily contact with patient to assess and evaluate symptoms and progress in treatment  -- Patient's case to be discussed in multi-disciplinary team meeting  -- Observation Level : q15 minute checks  -- Vital signs:  q12 hours  -- Precautions: suicide, elopement, and assault  2. Psychiatric Diagnoses and Treatment:   -- Continue Prozac '20mg'$  daily for depressive symptoms  -- Continue Haldol '2mg'$  bid - tapering down slowly as an outpatient as tolerated now that agitation and delirium have resolved to avoid rebound psychosis  -- Continue Remeron '15mg'$  qhs for help with sleep and depressive symptoms  -- Metabolic profile and EKG monitoring obtained while on an atypical antipsychotic (Lipid Panel: WNL other than cholesterol 217; HbgA1c:pending; QTc:454m and repeat EKG on Haldol ordered for QTC monitoring)   -- Encouraged patient to participate in unit milieu and in scheduled group therapies     Alcohol Use d/o  -- Continue CIWA with oral thiamine and MVI replacement  -- Completing Ativan taper - down to 0.'5mg'$  bid for 1 day then 0.'5mg'$  daily for 1 day then stop  -- CIWA scores 1,1,3  -- Patient agrees to [facility] Surgicalafter discharge and was counseled on need to abstain from alcohol after discharge   3. Medical Issues Being Addressed:   UTI  -- Continue Duricef '500mg'$  bid - completing 7 days today then stop  -- Urine culture not resulted - patient currently asymptomatic   Thrombocytopenia - improving  -- Up to 144 on 05/22/22 and will need f/u with PCP after discharge   Leukopenia  -- WBC 3.4 with ANC 1300   -- will continue to monitor with repeat CBC tomorrow   Transaminitis  --  LFTs down trending  -- Hepatitis panel negative, likely from alcohol  abuse  -- Holding Lipitor per hosptialist recommendations  -- RUQ US obtained: The distal common bile duct is dilated, but mid common bile duct is normal in size. This appearance is unchanged compared to 2017 CT and likely within normal limits for this patient. No cholelithiasis.Echogenic liver likely related to fatty infiltration.   Hyperlipidemia  -- Holding statin given LFT elevation - will need f/u with PCP after discharge   Contact Dermatitis  -- Continue Hydrocortisone cream topically bid   Sciatica   -- Consider trial of Neurontin if symptoms persist; PRN Tylenol and Motrin and Lidoderm patch ordered; PRN ice packs  4. Discharge Planning:   -- Social work and case management to assist with discharge planning and identification of hospital follow-up needs prior to discharge  -- Discharge Concerns: Need to establish a safety plan; Medication compliance and effectiveness  -- Discharge Goals: Return home with outpatient referrals for mental health follow-up including medication management/psychotherapy   Harlow Asa, MD, FAPA 05/24/2022, 11:50 AM

## 2022-05-24 NOTE — Plan of Care (Signed)
  Problem: Activity: Goal: Interest or engagement in activities will improve Outcome: Progressing   Problem: Coping: Goal: Ability to verbalize frustrations and anger appropriately will improve Outcome: Progressing   Problem: Coping: Goal: Ability to demonstrate self-control will improve Outcome: Progressing   Problem: Health Behavior/Discharge Planning: Goal: Compliance with treatment plan for underlying cause of condition will improve Outcome: Progressing   Problem: Physical Regulation: Goal: Ability to maintain clinical measurements within normal limits will improve Outcome: Progressing   Problem: Safety: Goal: Periods of time without injury will increase Outcome: Progressing

## 2022-05-24 NOTE — Group Note (Signed)
Recreation Therapy Group Note   Group Topic:Healthy Decision Making  Group Date: 05/24/2022 Start Time: 1004 End Time: 2706 Facilitators: Victorino Sparrow, LRT, CTRS Location: 500 Hall Dayroom   Goal Area(s) Addresses:  Patient will effectively work with peer towards shared goal.  Patient will identify factors that guided their decision making.  Patient will pro-socially communicate ideas during group session.    Group Description:  Patients were given a scenario that they were going to be stranded on a deserted [place] for several months before being rescued. Writer tasked them with making a list of 15 things they would choose to bring with them for "survival". The list of items was prioritized most important to least. Each patient would come up with their own list, then work together to create a new list of 15 items while in a group of 3-5 peers. LRT discussed each person's list and how it differed from others. The debrief included discussion of priorities, good decisions versus bad decisions, and how it is important to think before acting so we can make the best decision possible. LRT tied the concept of effective communication among group members to patient's support systems outside of the hospital and its benefit post discharge.   Affect/Mood: Appropriate   Participation Level: Engaged   Participation Quality: Independent   Behavior: Appropriate   Speech/Thought Process: Focused   Insight: Good   Judgement: Good   Modes of Intervention: Group work   Patient Response to Interventions:  Engaged   Education Outcome:  Acknowledges education and In group clarification offered    Clinical Observations/Individualized Feedback: Pt was bright and engaged throughout.  Pt was able to come up with items on her list such as water, sunscreen, knife, partner, fire stick and tarp.  Pt worked well with the peers in her group in coming up with items for their list.  Pt was attentive to  peers suggestions and vocal in sharing her own ideas.  Pt was appropriate throughout group session.   Plan: Continue to engage patient in RT group sessions 2-3x/week.   Victorino Sparrow, LRT,CTRS 05/24/2022 11:29 AM

## 2022-05-24 NOTE — Group Note (Signed)
Recreation Therapy Group Note   Group Topic:Animal Assisted Therapy   Group Date: 05/24/2022 Start Time: 1430 End Time: 1515 Facilitators: Victorino Sparrow, LRT,CTRS Location: [address] Hall Dayroom   Animal-Assisted Activity (AAA) Program Checklist/Progress Notes Patient Eligibility Criteria Checklist & Daily Group note for Rec Tx Intervention  AAA/T Program Assumption of Risk Form signed by Patient/ or Parent Legal Guardian Yes  Patient is free of allergies or severe asthma Yes  Patient reports no fear of animals Yes  Patient reports no history of cruelty to animals Yes  Patient understands his/her participation is voluntary Yes  Patient washes hands before animal contact Yes  Patient washes hands after animal contact Yes   Affect/Mood: Appropriate   Participation Level: Engaged    Clinical Observations/Individualized Feedback:  Patient attended session and interacted appropriately with therapy dog and peers. Patient asked appropriate questions about therapy dog and his training. Patient shared stories about their pets at home with group.    Plan: Continue to engage patient in RT group sessions 2-3x/week.   Victorino Sparrow, LRT,CTRS 05/24/2022 4:14 PM

## 2022-05-24 NOTE — Progress Notes (Signed)
   05/24/22 1117  Psych Admission Type (Psych Patients Only)  Admission Status Voluntary  Psychosocial Assessment  Patient Complaints Anxiety  Eye Contact Fair  Facial Expression Anxious  Affect Appropriate to circumstance  Speech Logical/coherent  Interaction Assertive  Motor Activity Other (Comment) (WNL)  Appearance/Hygiene Unremarkable  Behavior Characteristics Cooperative  Mood Euthymic;Pleasant  Thought Process  Coherency Circumstantial  Content Blaming self  Delusions WDL  Perception WDL  Hallucination None reported or observed  Judgment Impaired  Confusion None  Danger to Self  Current suicidal ideation? Denies  Agreement Not to Harm Self Yes  Description of Agreement verbal  Danger to Others  Danger to Others None reported or observed  Danger to Others Abnormal  Harmful Behavior to others No threats or harm toward other people  Destructive Behavior No threats or harm toward property

## 2022-05-24 NOTE — B[facility] Counselor (Signed)
CSW spoke with patient son who discussed additional questions about follow up.  CSW provided information about [facility] as well as residential programs.  CSW informed son that patient continues to decline residential services but that information would be including in the AVS.  Son also requested a call to the doctor regarding medical questions related to mother. CSW agreed to notify doctor of request.    Riki Altes, LCSW, [facility]

## 2022-05-25 DIAGNOSIS — F332 Major depressive disorder, recurrent severe without psychotic features: Secondary | ICD-10-CM

## 2022-05-25 LAB — CBC WITH DIFFERENTIAL/PLATELET
Abs Immature Granulocytes: 0.04 10*3/uL (ref 0.00–0.07)
Basophils Absolute: 0 10*3/uL (ref 0.0–0.1)
Basophils Relative: 1 %
Eosinophils Absolute: 0.1 10*3/uL (ref 0.0–0.5)
Eosinophils Relative: 2 %
HCT: 41.6 % (ref 36.0–46.0)
Hemoglobin: 13.9 g/dL (ref 12.0–15.0)
Immature Granulocytes: 1 %
Lymphocytes Relative: 33 %
Lymphs Abs: 1.6 10*3/uL (ref 0.7–4.0)
MCH: 34.2 pg — ABNORMAL HIGH (ref 26.0–34.0)
MCHC: 33.4 g/dL (ref 30.0–36.0)
MCV: 102.5 fL — ABNORMAL HIGH (ref 80.0–100.0)
Monocytes Absolute: 0.6 10*3/uL (ref 0.1–1.0)
Monocytes Relative: 12 %
Neutro Abs: 2.5 10*3/uL (ref 1.7–7.7)
Neutrophils Relative %: 51 %
Platelets: 193 10*3/uL (ref 150–400)
RBC: 4.06 MIL/uL (ref 3.87–5.11)
RDW: 15.3 % (ref 11.5–15.5)
WBC: 4.9 10*3/uL (ref 4.0–10.5)
nRBC: 0 % (ref 0.0–0.2)

## 2022-05-25 LAB — HEMOGLOBIN A1C
Hgb A1c MFr Bld: 5.3 % (ref 4.8–5.6)
Mean Plasma Glucose: 105.41 mg/dL

## 2022-05-25 MED ORDER — BENZTROPINE MESYLATE 0.5 MG PO TABS
0.5000 mg | ORAL_TABLET | Freq: Two times a day (BID) | ORAL | Status: DC | PRN
Start: 2022-05-25 — End: 2022-05-26

## 2022-05-25 NOTE — Progress Notes (Addendum)
[facility] MD Progress Note  05/25/2022 12:27 PM [name]  MRN:  [id number]  Chief Complaint: alcohol abuse; depression  Reason for Admission:  [name] is a 61 y.o., female with a past psychiatric history significant for depression and alcohol dependence who [facility] emergency room for evaluation and management of alcohol intoxication and substance-induced mood disorder. On arrival to the unit, she was agitated, psychotic, and responding to internal stimuli. The patient is currently on Hospital Day 9.   Chart Review from last 24 hours:  The patient's chart was reviewed and nursing notes were reviewed. The patient's case was discussed in multidisciplinary team meeting. Per nursing, she attended groups and had no acute behavioral issues or safety concerns noted. Per MAR she was compliant with scheduled medications and did receive Tylenol X1 for pain, Vistaril X1 for anxiety, and Imitrex X2 yesterday for HA, and Trazodone X1 for sleep.  Information Obtained Today During Patient Interview: The patient was seen and evaluated on the unit. She states that last night she slept the best she has slept since being in the hospital and feels better overall today with improved rest. She reports stable appetite and voices no physical complaints other than some intermittent headaches and improving sciatica pain. She denies medication side-effects. She denies SI, HI, AVH, paranoia, ideas of reference, first rank symptoms, or delusions. She denies depressed mood or anxiety and denies cravings or signs of alcohol withdrawal. She washed and styled her hair today and states she is attending groups. I discussed the conversation I had with her son. We reviewed her medical issues and repeat lab testing and she was made aware that she will need f/u with PCP after discharge for trending of her LFTs, determination when to restart her statin, recheck of her platelets, and  scheduling of thyroid US given findings noted on Head CT. She states she has not had a h/o thyroid issues and I advised that the CT scan noted that the thyroid mass has grown in size and [place] was recommended. We dicussed that her outpatient psychiatrist can continue to gradually taper down and off of her Haldol as an outpatient and that abrupt discontinuation of the med is not advised given the severity of her recent delirium symptoms. Time was given for questions.   Principal Problem: Substance induced mood disorder (HCC) Diagnosis: Principal Problem:   Substance induced mood disorder ([place]) Active Problems:   Alcohol dependence ([place])  Total Time Spent in Direct Patient Care:  I personally spent 30 minutes on the unit in direct patient care. The direct patient care time included face-to-face time with the patient, reviewing the patient's chart, communicating with other professionals, and coordinating care. Greater than 50% of this time was spent in counseling or coordinating care with the patient regarding goals of hospitalization, psycho-education, and discharge planning needs.   Past Psychiatric History: see H&P  Past Medical History:  Past Medical History:  Diagnosis Date   Alcohol dependence ([place])    recent IVC at [facility] 10/ 2015 by son--  11-07-2014 pt states no alcohol since 09-11-2014 (approx)   Anxiety    Frequency of urination    GERD (gastroesophageal reflux disease)    Headache(784.0)    Hypertension    no meds per pt,  states bp been ok    Major depression    PTSD (post-traumatic stress disorder)    Renal calculus, right    Right ureteral stone    Urgency  of urination     Past Surgical History:  Procedure Laterality Date   BILATERAL SALPINGECTOMY Bilateral 03/19/2014   Procedure: BILATERAL SALPINGECTOMY;  Surgeon: Lovenia Kim, MD;  Location: [facility];  Service: Gynecology;  Laterality: Bilateral;   CESAREAN SECTION  1991   HOLMIUM LASER APPLICATION Right 84/12/6604    Procedure: HOLMIUM LASER APPLICATION;  Surgeon: Alexis Frock, MD;  Location: [facility];  Service: Urology;  Laterality: Right;   ROBOTIC ASSISTED TOTAL HYSTERECTOMY N/A 03/19/2014   Procedure: ROBOTIC ASSISTED TOTAL HYSTERECTOMY;  Surgeon: Lovenia Kim, MD;  Location: [facility];  Service: Gynecology;  Laterality: N/A;   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 09-11-2014)     Social History   Substance and Sexual Activity  Drug Use Yes   Types: Marijuana, Benzodiazepines   Comment: last marjuana 2013/  ambien dependence    Social History   Socioeconomic History   Marital status: Divorced    Spouse name: Not on file   Number of children: Not on file   Years of education: Not on file   Highest education level: Not on file  Occupational History   Not on file  Tobacco Use   Smoking status: Former    Types: Cigarettes    Quit date: 09/11/2014    Years since quitting: 7.7   Smokeless tobacco: Never  Substance and Sexual Activity   Alcohol use: Yes    Alcohol/week: 20.0 standard drinks of alcohol    Types: 10 Glasses of wine, 10 Shots of liquor per week    Comment: last alcohol 55 days ago (approx. 09-11-2014)   Drug use: Yes    Types: Marijuana, Benzodiazepines    Comment: last marjuana 2013/  ambien dependence   Sexual activity: Not on file  Other Topics Concern   Not on file  Social History Narrative   Not on file   Social Determinants of Health   Financial Resource Strain: Not on file  Food Insecurity: Not on file   Transportation Needs: Not on file  Physical Activity: Not on file  Stress: Not on file  Social Connections: Not on file    Sleep: Good  Appetite:  Good  Current Medications: Current Facility-Administered Medications  Medication Dose Route Frequency Provider Last Rate Last Admin   acetaminophen (TYLENOL) tablet 500 mg  500 mg Oral Q6H PRN Nelda Marseille, Tiernan Millikin E, MD   500 mg at 05/24/22 1307   alum & mag hydroxide-simeth (MAALOX/MYLANTA) 200-200-20 MG/5ML suspension 30 mL  30 mL Oral Q4H PRN Ival Bible, MD       haloperidol (HALDOL) tablet 10 mg  10 mg Oral Q6H PRN Winfred Leeds, Nadir, MD   10 mg at 05/18/22 0002   And   LORazepam (ATIVAN) tablet 2 mg  2 mg Oral Q6H PRN Winfred Leeds, Nadir, MD   2 mg at 05/18/22 0001   And   benztropine (COGENTIN) tablet 1 mg  1 mg Oral Q6H PRN Winfred Leeds, Nadir, MD   1 mg at 05/18/22 1012  FLUoxetine (PROZAC) capsule 20 mg  20 mg Oral Daily Winfred Leeds, Nadir, MD   20 mg at 05/25/22 0818   haloperidol (HALDOL) tablet 2 mg  2 mg Oral BID Harlow Asa, MD   2 mg at 05/25/22 0815   hydrocortisone cream 1 %   Topical BID Maida Sale, MD   Given at 05/24/22 1638   hydrOXYzine (ATARAX) tablet 25 mg  25 mg Oral TID PRN Ival Bible, MD   25 mg at 05/24/22 2109   ibuprofen (ADVIL) tablet 600 mg  600 mg Oral Q6H PRN Harlow Asa, MD       lidocaine (LIDODERM) 5 % 1 patch  1 patch Transdermal Q24H Harlow Asa, MD   1 patch at 05/24/22 1411   magnesium hydroxide (MILK OF MAGNESIA) suspension 30 mL  30 mL Oral Daily PRN Ival Bible, MD   30 mL at 05/20/22 1738   metoprolol succinate (TOPROL-XL) 24 hr tablet 50 mg  50 mg Oral QHS Ival Bible, MD   50 mg at 05/24/22 2109   mirtazapine (REMERON) tablet 15 mg  15 mg Oral QHS Hill, Jackie Plum, MD   15 mg at 05/24/22 2109   multivitamin with minerals tablet 1 tablet  1 tablet Oral Daily Ival Bible, MD   1 tablet at 05/25/22 0816   pantoprazole (PROTONIX) EC tablet 40 mg   40 mg Oral Daily Ival Bible, MD   40 mg at 05/25/22 0813   polyethylene glycol (MIRALAX / GLYCOLAX) packet 17 g  17 g Oral Daily PRN Dian Situ, MD   17 g at 05/21/22 0846   SUMAtriptan (IMITREX) tablet 50 mg  50 mg Oral Q2H PRN Ival Bible, MD   50 mg at 05/25/22 1007   thiamine tablet 100 mg  100 mg Oral Daily Ival Bible, MD   100 mg at 05/25/22 0813   traZODone (DESYREL) tablet 100 mg  100 mg Oral QHS PRN Ival Bible, MD   100 mg at 05/24/22 2109    Lab Results:  Results for orders placed or performed during the hospital encounter of 05/16/22 (from the past 48 hour(s))  CBC with Differential/Platelet     Status: Abnormal   Collection Time: 05/25/22  6:34 AM  Result Value Ref Range   WBC 4.9 4.0 - 10.5 K/uL   RBC 4.06 3.87 - 5.11 MIL/uL   Hemoglobin 13.9 12.0 - 15.0 g/dL   HCT 41.6 36.0 - 46.0 %   MCV 102.5 (H) 80.0 - 100.0 fL   MCH 34.2 (H) 26.0 - 34.0 pg   MCHC 33.4 30.0 - 36.0 g/dL   RDW 15.3 11.5 - 15.5 %   Platelets 193 150 - 400 K/uL   nRBC 0.0 0.0 - 0.2 %   Neutrophils Relative % 51 %   Neutro Abs 2.5 1.7 - 7.7 K/uL   Lymphocytes Relative 33 %   Lymphs Abs 1.6 0.7 - 4.0 K/uL   Monocytes Relative 12 %   Monocytes Absolute 0.6 0.1 - 1.0 K/uL   Eosinophils Relative 2 %   Eosinophils Absolute 0.1 0.0 - 0.5 K/uL   Basophils Relative 1 %   Basophils Absolute 0.0 0.0 - 0.1 K/uL   Immature Granulocytes 1 %   Abs Immature Granulocytes 0.04 0.00 - 0.07 K/uL    Comment: Performed at [facility], [place] [address] [place], [place] [address]  Hemoglobin A1c     Status: None  Collection Time: 05/25/22  6:34 AM  Result Value Ref Range   Hgb A1c MFr Bld 5.3 4.8 - 5.6 %    Comment: (NOTE) Pre diabetes:          5.7%-6.4%  Diabetes:              >6.4%  Glycemic control for   <7.0% adults with diabetes    Mean Plasma Glucose 105.41 mg/dL    Comment: Performed at [facility] [address], [place], [place]  [address]    Blood Alcohol level:  Lab Results  Component Value Date   ETH 309 [facility]) 04/07/2022   ETH 262 (H) 48/54/6270    Metabolic Disorder Labs: Lab Results  Component Value Date   HGBA1C 5.3 05/25/2022   MPG 105.41 05/25/2022   No results found for: "PROLACTIN" Lab Results  Component Value Date   CHOL 217 (H) 05/15/2022   TRIG 111 05/15/2022   HDL 96 05/15/2022   CHOLHDL 2.3 05/15/2022   VLDL 22 05/15/2022   LDLCALC 99 05/15/2022    Physical Findings: AIMS: Facial and Oral Movements Muscles of Facial Expression: None, normal Lips and Perioral Area: None, normal Jaw: None, normal Tongue: None, normal,Extremity Movements Upper (arms, wrists, hands, fingers): None, normal Lower (legs, knees, ankles, toes): None, normal, Trunk Movements Neck, shoulders, hips: None, normal, Overall Severity Severity of abnormal movements (highest score from questions above): None, normal Incapacitation due to abnormal movements: None, normal Patient's awareness of abnormal movements (rate only patient's report): No Awareness, Dental Status Current problems with teeth and/or dentures?: No Does patient usually wear dentures?: No  CIWA:  CIWA-Ar Total: 0  Musculoskeletal: Strength & Muscle Tone: within normal limits Gait & Station: normal Patient leans: N/A  Psychiatric Specialty Exam:  Presentation  General Appearance: casually dressed,good hygiene  Eye Contact:Good  Speech:normal rate and fluency  Speech Volume:Normal  Mood and Affect  Mood:described as improved - appears euthymic  Affect:moderate, stable   Thought Process  Thought Processes:Linear, goal directed  Descriptions of Associations:Intact  Orientation:Full (Time, Place and Person)  Thought Content:Denies AVH, paranoia, ideas of reference, first rank symptoms; no delusions noted; denies SI or HI  Hallucinations:Denied  Ideas of Reference:None  Suicidal Thoughts:Denied  Homicidal  Thoughts:Denied   Sensorium  Memory:Immediate Fair; Remote Fair  Judgment:Intact  Insight:Improved   Executive Functions  Concentration:Good  Attention Span:Good  [place] of Knowledge:Good  Language:Good   Psychomotor Activity  Psychomotor Activity:Normal - no tremors noted   Assets  Assets:Communication Skills   Sleep  Sleep:7 hours   Physical Exam Vitals and nursing note reviewed.  Constitutional:      Appearance: Normal appearance.  HENT:     Head: Normocephalic.     Comments: Resolving bruise around right eye Pulmonary:     Effort: Pulmonary effort is normal.  Neurological:     General: No focal deficit present.     Mental Status: She is alert.    Review of Systems  Respiratory:  Negative for shortness of breath.   Cardiovascular:  Negative for chest pain.  Gastrointestinal:  Negative for diarrhea, nausea and vomiting.  Musculoskeletal:        Right sciatica pain  Neurological:  Positive for headaches.   Blood pressure 113/78, pulse 72, temperature 98.4 F (36.9 C), temperature source Oral, resp. rate 16, height '5\' 1"'$  (1.549 m), weight 66.7 kg, last menstrual period 02/09/2014, SpO2 98 %. Body mass index is 27.78 kg/m.   Treatment Plan Summary: Diagnoses /  Active Problems: Delirium resolved (likely due to alcohol abuse and UTI) Substance induced mood d/o (r/o MDD) Alcohol use d/o UTI Contact dermatitis Thrombocytopenia Transaminitis Leukopenia - resolved  PLAN: Safety and Monitoring:  -- Involuntary Admission to inpatient psychiatric unit for safety, stabilization and treatment  -- Daily contact with patient to assess and evaluate symptoms and progress in treatment  -- Patient's case to be discussed in multi-disciplinary team meeting  -- Observation Level : q15 minute checks  -- Vital signs:  q12 hours  -- Precautions: suicide, elopement, and assault  2. Psychiatric Diagnoses and Treatment:   -- Continue Prozac '20mg'$   daily for depressive symptoms  -- Continue Haldol '2mg'$  bid - discussed need for outpatient taper gradually off med now that agitation and delirium have resolved to avoid rebound psychosis  -- Continue Remeron '15mg'$  qhs for help with sleep and depressive symptoms  -- Metabolic profile and EKG monitoring obtained while on an atypical antipsychotic (Lipid Panel: WNL other than cholesterol 217; HbgA1c:5.3; QTc:454m and repeat EKG on Haldol ordered for QTC monitoring)   -- Encouraged patient to participate in unit milieu and in scheduled group therapies     Alcohol Use d/o  -- Continue CIWA with oral thiamine and MVI replacement  -- Completing Ativan taper - down to 0.'5mg'$  daily today then stop  -- CIWA score 0  -- [facility] and was counseled on need to abstain from alcohol after discharge   3. Medical Issues Being Addressed:   UTI  -- Completed Duricef '500mg'$  bid for 7 day course  --  Patient currently asymptomatic   Thrombocytopenia - improving  -- Up to 144 on 05/22/22 and will need f/u with PCP after discharge   Leukopenia - resolved  -- WBC up to 4.9 and ANC up to 2500 today   Transaminitis  -- LFTs down trending  -- Hepatitis panel negative, likely from alcohol abuse  -- Holding Lipitor per hosptialist recommendations  -- RUQ [place]ained: The distal common bile duct is dilated, but mid common bile duct is normal in size. This appearance is unchanged compared to 2017 CT and likely within normal limits for this patient. No cholelithiasis.Echogenic liver likely related to fatty infiltration.   Hyperlipidemia  -- Holding statin given LFT elevation - will need f/u with PCP after discharge   Contact Dermatitis  -- Continue Hydrocortisone cream topically bid   Sciatica   -- Declines start of Neurontin; PRN Tylenol and Motrin and Lidoderm patch ordered; PRN ice packs   Thyroid mass noted on CT  -- Advised she will need [place]for further w/u and potentially  additional thyroid lab studies with PCP after discharge  4. Discharge Planning:   -- Social work and case management to assist with discharge planning and identification of hospital follow-up needs prior to discharge  -- Discharge Concerns: Need to establish a safety plan; Medication compliance and effectiveness  -- Discharge Goals: Return home with outpatient referrals for mental health follow-up including medication management/psychotherapy   AHarlow Asa MD, FAPA 05/25/2022, 12:27 PM

## 2022-05-25 NOTE — Progress Notes (Signed)
   05/24/22 2300  Psych Admission Type (Psych Patients Only)  Admission Status Voluntary  Psychosocial Assessment  Patient Complaints Anxiety  Eye Contact Fair  Facial Expression Anxious  Affect Appropriate to circumstance  Speech Logical/coherent  Interaction Assertive  Motor Activity Slow  Appearance/Hygiene Unremarkable  Behavior Characteristics Cooperative;Appropriate to situation  Mood Pleasant  Thought Process  Coherency WDL  Content WDL  Delusions None reported or observed  Perception WDL  Hallucination None reported or observed  Judgment Impaired  Confusion None  Danger to Self  Current suicidal ideation? Denies  Agreement Not to Harm Self Yes  Description of Agreement verbal  Danger to Others  Danger to Others None reported or observed  Danger to Others Abnormal  Harmful Behavior to others No threats or harm toward other people  Destructive Behavior No threats or harm toward property

## 2022-05-25 NOTE — BHH Counselor (Signed)
CSW met with patient regarding patient discharge.  Patient provided opportunity to ask questions and questions answered by CSW.  CSW reviewed follow up appts.  Patient agreed to call friend to assist with picking her up for discharge tomorrow.  CSW agreed that they would meet in the morning to finalize discharge.    Jill Bolger, LCSW, [place] Social Worker  [facility]

## 2022-05-25 NOTE — Group Note (Signed)
LCSW Group Therapy Note   Group Date: 05/25/2022 Start Time: 1300 End Time: 1400   Type of Therapy and Topic:  Group Therapy: Boundaries  Participation Level:  Active  Description of Group: This group will address the use of boundaries in their personal lives. Patients will explore why boundaries are important, the difference between healthy and unhealthy boundaries, and negative and postive outcomes of different boundaries and will look at how boundaries can be crossed.  Patients will be encouraged to identify current boundaries in their own lives and identify what kind of boundary is being set. Facilitators will guide patients in utilizing problem-solving interventions to address and correct types boundaries being used and to address when no boundary is being used. Understanding and applying boundaries will be explored and addressed for obtaining and maintaining a balanced life. Patients will be encouraged to explore ways to assertively make their boundaries and needs known to significant others in their lives, using other group members and facilitator for role play, support, and feedback.  Therapeutic Goals:  1.  Patient will identify areas in their life where setting clear boundaries could be  used to improve their life.  2.  Patient will identify signs/triggers that a boundary is not being respected. 3.  Patient will identify two ways to set boundaries in order to achieve balance in  their lives: 4.  Patient will demonstrate ability to communicate their needs and set boundaries  through discussion and/or role plays  Summary of Patient Progress:  The Pt was present/active throughout the session and proved open to feedback from [place] and peers. Patient demonstrated insight into the subject matter, was respectful of peers, and was present throughout the entire session.  Therapeutic Modalities:   Cognitive Behavioral Therapy Solution-Focused Therapy  Darleen Crocker, Latanya Presser 05/25/2022  1:54  PM

## 2022-05-25 NOTE — BHH Suicide Risk Assessment (Signed)
[facility] Discharge Suicide Risk Assessment   Principal Problem: Substance induced mood disorder [facility]) Discharge Diagnoses: Principal Problem:   Substance induced mood disorder ([place]) Active Problems:   Alcohol dependence ([place])  Total Time Spent in Direct Patient Care:  I personally spent 35 minutes on the unit in direct patient care. The direct patient care time included face-to-face time with the patient, reviewing the patient's chart, communicating with other professionals, and coordinating care. Greater than 50% of this time was spent in counseling or coordinating care with the patient regarding goals of hospitalization, psycho-education, and discharge planning needs.  Subjective: Patient was seen on rounds. She denies SI, HI, AVH, paranoia, delusions, ideas of reference or first rank symptoms. She reports stable appetite and denies medication side-effects. She states she did not sleep as well last night due to anticipation of going home and ruminations about how her alcohol use has impacted her health and others. She denies cravings for alcohol or acute signs of withdrawal. She can articulate a safety and discharge plan and her son will be visiting this weekend. She agrees to start [facility] after discharge.   Time was spent reviewing the need to have her PCP recheck her platelets and elevated liver function enzymes and to schedule her for an [place] of her thyroid mass without fail. She was reminded that her PCP needs to determine when it is safe to resume her statin and to recheck her after recent treatment for her UTI. She was made aware that her WBC and platelets had normalized prior to discharge. She was advised that her RUQ ultrasound showed that her distal common bile duct is dilated but the appearance is unchanged per radiologist compared to CT in 2017 and may be within normal limits for her. She was encouraged to talk about this finding with her PCP to determine if a GI referral is indicated. 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 She is currently asymptomatic for cardiac symptoms and was advised to talk to her PCP about if a cardiology referral is indicated. She was reminded that her outpatient psychiatrist can work with her to gradually taper off Haldol now that her delirium has resolved but to do this carefully to avoid rebound psychosis with abrupt medication discontinuation. Time was given for questions.   Musculoskeletal: Strength & Muscle Tone: within normal limits Gait & Station: normal Patient leans: N/A  Psychiatric Specialty Exam Presentation  General Appearance: casually dressed,good hygiene   Eye Contact:Good   Speech:normal rate and fluency   Speech Volume:Normal   Mood and Affect  Mood:anxious   Affect:congruent     Thought Process  Thought Processes:Linear, goal directed   Descriptions of Associations:Intact   Orientation:Full (Time, Place and Person)   Thought Content:Denies AVH, paranoia, ideas of reference, first rank symptoms; no delusions noted; denies SI or HI   Hallucinations:Denied   Ideas of Reference:None   Suicidal Thoughts:Denied   Homicidal Thoughts:Denied     Sensorium  Memory:Immediate Fair; Remote Fair   Judgment:Intact   Insight:Improved     Executive Functions  Concentration:Good   Attention Span:Good   [place] of Knowledge:Good   Language:Good     Psychomotor Activity  Psychomotor Activity:Normal - no tremors noted, no cogwheeling, no stiffness, AIMS 0     Assets  Assets:Communication Skills, supportive family, resilience   Sleep  5.25 hours  Physical Exam Vitals and nursing note reviewed.  HENT:  Head: Normocephalic.  Pulmonary:     Effort: Pulmonary effort is normal.  Neurological:     General: No focal deficit present.     Mental Status: She is alert.    Review of Systems  Respiratory:  Negative for  shortness of breath.   Cardiovascular:  Negative for chest pain and palpitations.  Gastrointestinal:  Negative for abdominal pain, diarrhea, nausea and vomiting.  Genitourinary:  Negative for dysuria, frequency and urgency.  Neurological:  Positive for headaches. Negative for dizziness.   Blood pressure 131/86, pulse 74, temperature 98.4 F (36.9 C), temperature source Oral, resp. rate 16, height '5\' 1"'$  (1.549 m), weight 66.7 kg, last menstrual period 02/09/2014, SpO2 100 %. Body mass index is 27.78 kg/m.  Mental Status Per Nursing Assessment::   Demographic Factors:  Caucasian, Living alone  Loss Factors: Loss of significant relationship  Historical Factors: Family history of mental illness or substance abuse and h/o trauma and h/o previous psychiatric diagnoses/treatments; substance use prior to admission  Risk Reduction Factors:   Sense of responsibility to family, Positive social support, and Positive coping skills or problem solving skills  Continued Clinical Symptoms:  Depression:   Insomnia Alcohol/Substance Abuse/Dependencies More than one psychiatric diagnosis Previous Psychiatric Diagnoses and Treatments Medical Diagnoses and Treatments/Surgeries  Cognitive Features That Contribute To Risk:  None    Suicide Risk:  Mild:  There are no identifiable plans, no associated intent, mild dysphoria and related symptoms,  few other risk factors, and identifiable protective factors, including available and accessible social support.   Follow-up Information     [facility]. Call on 06/10/2022.   Why: You have an appointment for medication management services on 06/10/22 at 3:00 pm.  This appointment will be held in person. Contact information: [place] [address] [phone]         [facility] [place]. Go on 06/09/2022.   Why: You have an appointment for therapy services on 06/09/22 at 2:00 pm.  This appointment will be  held in person. Contact information: [address] [place], [place] [address] [phone]         [facility]. Go on 05/30/2022.   Specialty: Behavioral Health Why: You have an appointment for the substance abuse intensive outpatient therapy program on  05/30/22 at 10:00 am.   This appointment will be held in person.  Please bring your insurance card with you.  The appointment will last approximately 1.5 hours. Contact information: [address] [place] [address] [phone]         [facility]. Call.   Specialty: Hospice and Palliative Medicine Why: Please call personally to schedule an appointment with this provider for grief/bereavement counseling services. Contact information: [facility]. Call.   Why: If you want residential treatment in the future for alcohol use, please call this facility for possible admittance.  They accept Medicare. Contact information: [address]  [place], [place] [address] [phone]        [facility]. Call.   Why: If you want residential treatment in the future for alcohol use, please call this facility for possible admittance.  They accept Medicare. Contact information: [place],  [place],  [address] [phone]        [facility] Stalker, PA-C. Go on 06/06/2022.   Specialty: Family Medicine Why: You have an  appointment with your primary care provider on June 06, 2022 at 11:30 am.  This appointment will be held in person. Contact information: [place] [address] [phone]                 Plan Of Care/Follow-up recommendations:  Activity:  as tolerated Diet:  heart healthy, low cholesterol  Other:   Patient was advised to see her primary care provider as scheduled without fail for recheck of the following:  Monitoring of  recently low platelet count that normalized prior to discharge Elevated liver function enzymes (advised to avoid excessive amounts of Tylenol or use of alcohol given liver enzyme elevation) Monitoring of recently low white blood cell count that normalized prior to discharge 2.3 cm left thyroid mass noted on CT (need to schedule ultrasound with primary care provider and see if you need additional thyroid lab testing) Distal common bile duct is dilated based on [place] of liver but appearance is unchanged compared to 2017 CT per radiologist reading - talk with primary care provider about possible gastroenterology referral History of high cholesterol off Lipitor (med held due to elevated liver enzymes and primary care to help determine when and if it is safe to resume statin) Recheck after recent 7 day course of antibiotic for urinary tract infection EKG shows a possible age undetermined inferior infarct that is unchanged compared to previous EKGs  Management of migraines and sciatica pain  Patient was advised to abstain from alcohol and illicit drug use and to keep appointments as scheduled for substance abuse intensive outpatient with the [facility].  Patient was advised that her outpatient psychiatrist can help continue gradually tapering her down and off of Haldol now that her delirium has resolved. If she remains on Haldol long term, she will need ongoing monitoring of her lipids, glucose, weight, AIMS, CBC, and EKG. If she develops any muscle stiffness or tremor she should use as needed dose of Cogentin and will need to monitor for signs of involuntary movements on this medication. She was encouraged to engage in psychotherapy after discharge and to monitor for signs of suicidal thinking while on Prozac.   Harlow Asa, MD, FAPA 05/26/2022, 6:57 AM

## 2022-05-25 NOTE — BHH Group Notes (Signed)
PT came to NA and was appropriate and attentive.

## 2022-05-25 NOTE — Group Note (Signed)
Recreation Therapy Group Note   Group Topic:Stress Management  Group Date: 05/25/2022 Start Time: 0935 End Time: 0948 Facilitators: Victorino Sparrow, [place] Location: [address] Dayroom   Goal Area(s) Addresses:  Patient will identify positive stress management techniques. Patient will identify benefits of using stress management post d/c.  Group Description:  [facility] Meditation.  Patients were to relax and listen to the meditation, which focused on taking on all of the characteristics a mountain has to offer.  Patients were to picture themselves weathering what life throws at them without giving up, just as the mountain stands tall no matter goes on around it.    Affect/Mood: N/A   Participation Level: Did not attend    Clinical Observations/Individualized Feedback:     Plan: Continue to engage patient in RT group sessions 2-3x/week.   Victorino Sparrow, Glennis Brink  05/25/2022 12:23 PM

## 2022-05-26 MED ORDER — HYDROCORTISONE 1 % EX CREA: TOPICAL_CREAM | Freq: Two times a day (BID) | CUTANEOUS | 0 refills | Status: DC | PRN

## 2022-05-26 MED ORDER — FLUOXETINE HCL 20 MG PO CAPS: 20.0000 mg | ORAL_CAPSULE | Freq: Every day | ORAL | 0 refills | Status: AC

## 2022-05-26 MED ORDER — MIRTAZAPINE 15 MG PO TABS: 15.0000 mg | ORAL_TABLET | Freq: Every day | ORAL | 0 refills | Status: DC

## 2022-05-26 MED ORDER — HALOPERIDOL 2 MG PO TABS: 2.0000 mg | ORAL_TABLET | Freq: Two times a day (BID) | ORAL | 0 refills | Status: DC

## 2022-05-26 MED ORDER — TRAZODONE HCL 100 MG PO TABS
100.0000 mg | ORAL_TABLET | Freq: Every evening | ORAL | 0 refills | Status: AC | PRN
Start: 2022-05-26 — End: ?

## 2022-05-26 MED ORDER — BENZTROPINE MESYLATE 0.5 MG PO TABS: 0.5000 mg | ORAL_TABLET | Freq: Two times a day (BID) | ORAL | 0 refills | Status: DC | PRN

## 2022-05-26 MED ORDER — HYDROXYZINE HCL 25 MG PO TABS: 25.0000 mg | ORAL_TABLET | Freq: Three times a day (TID) | ORAL | 0 refills | Status: DC | PRN

## 2022-05-26 NOTE — Progress Notes (Signed)
Patient ID: [name], female   DOB: 07/27/1961, 61 y.o.   MRN: [id number]   Pt ambulatory, alert, and oriented X4 on and off the unit. Education, support, and encouragement provided. Discharge summary/AVS, prescriptions, medications, and follow up appointments reviewed with pt and a copy of the AVS was given to pt. Suicide prevention resources provided. Pt's belongings in locker #11 returned and belongings sheet signed. Pt denies SI/HI, AVH, or any concerns at this time. Pt discharged to lobby.

## 2022-05-26 NOTE — Progress Notes (Signed)
   05/25/22 2200  Psych Admission Type (Psych Patients Only)  Admission Status Voluntary  Psychosocial Assessment  Patient Complaints Anxiety  Eye Contact Fair  Facial Expression Anxious  Affect Appropriate to circumstance  Speech Logical/coherent  Interaction Assertive  Motor Activity Slow  Appearance/Hygiene Unremarkable  Behavior Characteristics Appropriate to situation;Cooperative  Mood Pleasant  Thought Process  Coherency WDL  Content WDL  Delusions None reported or observed  Perception WDL  Hallucination None reported or observed  Judgment WDL  Confusion None  Danger to Self  Current suicidal ideation? Denies  Agreement Not to Harm Self Yes  Description of Agreement verbal  Danger to Others  Danger to Others None reported or observed  Danger to Others Abnormal  Harmful Behavior to others No threats or harm toward other people  Destructive Behavior No threats or harm toward property

## 2022-05-26 NOTE — Progress Notes (Signed)
  [facility] Adult Case Management Discharge Plan :  Will you be returning to the same living situation after discharge:  Yes,  home At discharge, do you have transportation home?: Yes,  friend Do you have the ability to pay for your medications: Yes,  insurance  Release of information consent forms completed and in the chart;  Patient's signature needed at discharge.  Patient to Follow up at:  Follow-up Information     [facility]. Call on 06/10/2022.   Why: You have an appointment for medication management services on 06/10/22 at 3:00 pm.  This appointment will be held in person. Contact information: [place] [address] [phone]         [facility]. Go on 06/09/2022.   Why: You have an appointment for therapy services on 06/09/22 at 2:00 pm.  This appointment will be held in person. Contact information: [address] [place], [place] [address] [phone]         [facility]. Go on 05/30/2022.   Specialty: Behavioral Health Why: You have an appointment for the substance abuse intensive outpatient therapy program on  05/30/22 at 10:00 am.   This appointment will be held in person.  Please bring your insurance card with you.  The appointment will last approximately 1.5 hours. Contact information: [address] [place] [address] [phone]         [facility]. Call.   Specialty: Hospice and Palliative Medicine Why: Please call personally to schedule an appointment with this provider for grief/bereavement counseling services. Contact information: [place]. Call.   Why: If you want residential treatment in the future for alcohol use, please call this facility for possible admittance.  They accept Medicare. Contact information: [address]  [place], [place] [address] [phone]        [facility]. Call.   Why: If you want residential treatment in the future for alcohol use, please call this facility for possible admittance.  They accept Medicare. Contact information: [place],  [place], [place] [address] [phone]        [facility] Stalker, PA-C. Go on 06/06/2022.   Specialty: Family Medicine Why: You have an appointment with your primary care provider on June 06, 2022 at 11:30 am.  This appointment will be held in person. Contact information: [place] [address] [phone]                 Next level of care provider has access to [facility] and Suicide Prevention discussed: Yes,  sons, [name] and Lysbeth Galas     Has patient been referred to the Quitline?: N/A patient is not a smoker  Patient has been referred for addiction treatment: Yes, [facility] appt for [place], LCSW 05/26/2022, 9:50 AM

## 2022-05-30 DIAGNOSIS — F1020 Alcohol dependence, uncomplicated: Secondary | ICD-10-CM | POA: Diagnosis not present

## 2022-05-31 DIAGNOSIS — F1020 Alcohol dependence, uncomplicated: Secondary | ICD-10-CM | POA: Diagnosis not present

## 2022-06-01 DIAGNOSIS — F1020 Alcohol dependence, uncomplicated: Secondary | ICD-10-CM | POA: Diagnosis not present

## 2022-06-02 DIAGNOSIS — F1020 Alcohol dependence, uncomplicated: Secondary | ICD-10-CM | POA: Diagnosis not present

## 2022-06-03 DIAGNOSIS — F1020 Alcohol dependence, uncomplicated: Secondary | ICD-10-CM | POA: Diagnosis not present

## 2022-06-06 DIAGNOSIS — R7303 Prediabetes: Secondary | ICD-10-CM | POA: Diagnosis not present

## 2022-06-06 DIAGNOSIS — E7800 Pure hypercholesterolemia, unspecified: Secondary | ICD-10-CM | POA: Diagnosis not present

## 2022-06-06 DIAGNOSIS — R198 Other specified symptoms and signs involving the digestive system and abdomen: Secondary | ICD-10-CM | POA: Diagnosis not present

## 2022-06-06 DIAGNOSIS — Z8659 Personal history of other mental and behavioral disorders: Secondary | ICD-10-CM | POA: Diagnosis not present

## 2022-06-06 DIAGNOSIS — I10 Essential (primary) hypertension: Secondary | ICD-10-CM | POA: Diagnosis not present

## 2022-06-06 DIAGNOSIS — F1020 Alcohol dependence, uncomplicated: Secondary | ICD-10-CM | POA: Diagnosis not present

## 2022-06-08 DIAGNOSIS — F1020 Alcohol dependence, uncomplicated: Secondary | ICD-10-CM | POA: Diagnosis not present

## 2022-06-10 ENCOUNTER — Telehealth (HOSPITAL_COMMUNITY): Payer: Self-pay | Admitting: Family Medicine

## 2022-06-10 DIAGNOSIS — F1020 Alcohol dependence, uncomplicated: Secondary | ICD-10-CM | POA: Diagnosis not present

## 2022-06-10 NOTE — BH Assessment (Signed)
Care Management - Follow Up [facility] Discharges   Patient has been placed in an inpatient psychiatric hospital ([facility]) on 05-16-2022.

## 2022-06-13 DIAGNOSIS — F1020 Alcohol dependence, uncomplicated: Secondary | ICD-10-CM | POA: Diagnosis not present

## 2022-06-14 DIAGNOSIS — F1020 Alcohol dependence, uncomplicated: Secondary | ICD-10-CM | POA: Diagnosis not present

## 2022-06-15 DIAGNOSIS — F1020 Alcohol dependence, uncomplicated: Secondary | ICD-10-CM | POA: Diagnosis not present

## 2022-06-16 DIAGNOSIS — F1020 Alcohol dependence, uncomplicated: Secondary | ICD-10-CM | POA: Diagnosis not present

## 2022-06-17 DIAGNOSIS — F1020 Alcohol dependence, uncomplicated: Secondary | ICD-10-CM | POA: Diagnosis not present

## 2022-06-20 DIAGNOSIS — M5459 Other low back pain: Secondary | ICD-10-CM | POA: Diagnosis not present

## 2022-06-20 DIAGNOSIS — Z79891 Long term (current) use of opiate analgesic: Secondary | ICD-10-CM | POA: Diagnosis not present

## 2022-06-20 DIAGNOSIS — F1020 Alcohol dependence, uncomplicated: Secondary | ICD-10-CM | POA: Diagnosis not present

## 2022-06-20 DIAGNOSIS — M5136 Other intervertebral disc degeneration, lumbar region: Secondary | ICD-10-CM | POA: Diagnosis not present

## 2022-06-20 DIAGNOSIS — G894 Chronic pain syndrome: Secondary | ICD-10-CM | POA: Diagnosis not present

## 2022-06-21 DIAGNOSIS — F1020 Alcohol dependence, uncomplicated: Secondary | ICD-10-CM | POA: Diagnosis not present

## 2022-06-22 DIAGNOSIS — F1020 Alcohol dependence, uncomplicated: Secondary | ICD-10-CM | POA: Diagnosis not present

## 2022-06-24 DIAGNOSIS — F1020 Alcohol dependence, uncomplicated: Secondary | ICD-10-CM | POA: Diagnosis not present

## 2022-06-27 DIAGNOSIS — F1020 Alcohol dependence, uncomplicated: Secondary | ICD-10-CM | POA: Diagnosis not present

## 2022-06-28 DIAGNOSIS — F1020 Alcohol dependence, uncomplicated: Secondary | ICD-10-CM | POA: Diagnosis not present

## 2022-06-29 DIAGNOSIS — F1020 Alcohol dependence, uncomplicated: Secondary | ICD-10-CM | POA: Diagnosis not present

## 2022-07-01 DIAGNOSIS — F1020 Alcohol dependence, uncomplicated: Secondary | ICD-10-CM | POA: Diagnosis not present

## 2022-07-04 DIAGNOSIS — F1020 Alcohol dependence, uncomplicated: Secondary | ICD-10-CM | POA: Diagnosis not present

## 2022-07-05 DIAGNOSIS — F1020 Alcohol dependence, uncomplicated: Secondary | ICD-10-CM | POA: Diagnosis not present

## 2022-07-06 DIAGNOSIS — F1020 Alcohol dependence, uncomplicated: Secondary | ICD-10-CM | POA: Diagnosis not present

## 2022-07-08 DIAGNOSIS — I10 Essential (primary) hypertension: Secondary | ICD-10-CM | POA: Diagnosis not present

## 2022-07-08 DIAGNOSIS — R7303 Prediabetes: Secondary | ICD-10-CM | POA: Diagnosis not present

## 2022-07-08 DIAGNOSIS — F1020 Alcohol dependence, uncomplicated: Secondary | ICD-10-CM | POA: Diagnosis not present

## 2022-07-08 DIAGNOSIS — E7800 Pure hypercholesterolemia, unspecified: Secondary | ICD-10-CM | POA: Diagnosis not present

## 2022-07-08 DIAGNOSIS — Z8659 Personal history of other mental and behavioral disorders: Secondary | ICD-10-CM | POA: Diagnosis not present

## 2022-07-11 DIAGNOSIS — F1020 Alcohol dependence, uncomplicated: Secondary | ICD-10-CM | POA: Diagnosis not present

## 2022-07-12 DIAGNOSIS — R509 Fever, unspecified: Secondary | ICD-10-CM | POA: Diagnosis not present

## 2022-07-12 DIAGNOSIS — I10 Essential (primary) hypertension: Secondary | ICD-10-CM | POA: Diagnosis not present

## 2022-07-12 DIAGNOSIS — Z20822 Contact with and (suspected) exposure to covid-19: Secondary | ICD-10-CM | POA: Diagnosis not present

## 2022-07-12 DIAGNOSIS — Z03818 Encounter for observation for suspected exposure to other biological agents ruled out: Secondary | ICD-10-CM | POA: Diagnosis not present

## 2022-07-12 DIAGNOSIS — J069 Acute upper respiratory infection, unspecified: Secondary | ICD-10-CM | POA: Diagnosis not present

## 2022-07-12 DIAGNOSIS — U071 COVID-19: Secondary | ICD-10-CM | POA: Diagnosis not present

## 2022-07-14 DIAGNOSIS — F1020 Alcohol dependence, uncomplicated: Secondary | ICD-10-CM | POA: Diagnosis not present

## 2022-07-19 DIAGNOSIS — F1020 Alcohol dependence, uncomplicated: Secondary | ICD-10-CM | POA: Diagnosis not present

## 2022-07-20 DIAGNOSIS — F1020 Alcohol dependence, uncomplicated: Secondary | ICD-10-CM | POA: Diagnosis not present

## 2022-07-22 DIAGNOSIS — F1020 Alcohol dependence, uncomplicated: Secondary | ICD-10-CM | POA: Diagnosis not present

## 2022-07-25 DIAGNOSIS — F1020 Alcohol dependence, uncomplicated: Secondary | ICD-10-CM | POA: Diagnosis not present

## 2022-07-26 DIAGNOSIS — F1020 Alcohol dependence, uncomplicated: Secondary | ICD-10-CM | POA: Diagnosis not present

## 2022-07-27 DIAGNOSIS — F1020 Alcohol dependence, uncomplicated: Secondary | ICD-10-CM | POA: Diagnosis not present

## 2022-07-29 DIAGNOSIS — F1020 Alcohol dependence, uncomplicated: Secondary | ICD-10-CM | POA: Diagnosis not present

## 2022-08-01 DIAGNOSIS — F1020 Alcohol dependence, uncomplicated: Secondary | ICD-10-CM | POA: Diagnosis not present

## 2022-08-02 DIAGNOSIS — F1020 Alcohol dependence, uncomplicated: Secondary | ICD-10-CM | POA: Diagnosis not present

## 2022-08-03 DIAGNOSIS — F1020 Alcohol dependence, uncomplicated: Secondary | ICD-10-CM | POA: Diagnosis not present

## 2022-08-05 DIAGNOSIS — F1020 Alcohol dependence, uncomplicated: Secondary | ICD-10-CM | POA: Diagnosis not present

## 2022-08-08 DIAGNOSIS — F1020 Alcohol dependence, uncomplicated: Secondary | ICD-10-CM | POA: Diagnosis not present

## 2022-08-09 DIAGNOSIS — F1020 Alcohol dependence, uncomplicated: Secondary | ICD-10-CM | POA: Diagnosis not present

## 2022-08-10 DIAGNOSIS — F1020 Alcohol dependence, uncomplicated: Secondary | ICD-10-CM | POA: Diagnosis not present

## 2022-08-11 DIAGNOSIS — E041 Nontoxic single thyroid nodule: Secondary | ICD-10-CM | POA: Diagnosis not present

## 2022-08-12 ENCOUNTER — Other Ambulatory Visit: Payer: Self-pay | Admitting: Otolaryngology

## 2022-08-12 DIAGNOSIS — E041 Nontoxic single thyroid nodule: Secondary | ICD-10-CM

## 2022-08-12 DIAGNOSIS — F1020 Alcohol dependence, uncomplicated: Secondary | ICD-10-CM | POA: Diagnosis not present

## 2022-08-15 ENCOUNTER — Ambulatory Visit
Admission: RE | Admit: 2022-08-15 | Discharge: 2022-08-15 | Disposition: A | Discharge status: Home / Self Care | Payer: Medicare HMO | Source: Ambulatory Visit | Attending: Otolaryngology | Admitting: Otolaryngology

## 2022-08-15 DIAGNOSIS — E041 Nontoxic single thyroid nodule: Secondary | ICD-10-CM | POA: Diagnosis not present

## 2022-08-15 DIAGNOSIS — F1020 Alcohol dependence, uncomplicated: Secondary | ICD-10-CM | POA: Diagnosis not present

## 2022-08-16 ENCOUNTER — Other Ambulatory Visit: Payer: Self-pay | Admitting: Otolaryngology

## 2022-08-16 DIAGNOSIS — E041 Nontoxic single thyroid nodule: Secondary | ICD-10-CM

## 2022-08-16 DIAGNOSIS — F1020 Alcohol dependence, uncomplicated: Secondary | ICD-10-CM | POA: Diagnosis not present

## 2022-08-17 DIAGNOSIS — F1020 Alcohol dependence, uncomplicated: Secondary | ICD-10-CM | POA: Diagnosis not present

## 2022-08-19 DIAGNOSIS — F1020 Alcohol dependence, uncomplicated: Secondary | ICD-10-CM | POA: Diagnosis not present

## 2022-08-22 DIAGNOSIS — F1020 Alcohol dependence, uncomplicated: Secondary | ICD-10-CM | POA: Diagnosis not present

## 2022-08-23 DIAGNOSIS — F1020 Alcohol dependence, uncomplicated: Secondary | ICD-10-CM | POA: Diagnosis not present

## 2022-08-24 DIAGNOSIS — F1020 Alcohol dependence, uncomplicated: Secondary | ICD-10-CM | POA: Diagnosis not present

## 2022-08-26 DIAGNOSIS — F1020 Alcohol dependence, uncomplicated: Secondary | ICD-10-CM | POA: Diagnosis not present

## 2022-08-29 DIAGNOSIS — F330 Major depressive disorder, recurrent, mild: Secondary | ICD-10-CM | POA: Diagnosis not present

## 2022-08-29 DIAGNOSIS — F1020 Alcohol dependence, uncomplicated: Secondary | ICD-10-CM | POA: Diagnosis not present

## 2022-08-30 DIAGNOSIS — F1020 Alcohol dependence, uncomplicated: Secondary | ICD-10-CM | POA: Diagnosis not present

## 2022-08-31 DIAGNOSIS — F1020 Alcohol dependence, uncomplicated: Secondary | ICD-10-CM | POA: Diagnosis not present

## 2022-09-01 DIAGNOSIS — F1020 Alcohol dependence, uncomplicated: Secondary | ICD-10-CM | POA: Diagnosis not present

## 2022-09-02 DIAGNOSIS — F1020 Alcohol dependence, uncomplicated: Secondary | ICD-10-CM | POA: Diagnosis not present

## 2022-09-05 DIAGNOSIS — F1020 Alcohol dependence, uncomplicated: Secondary | ICD-10-CM | POA: Diagnosis not present

## 2022-09-06 DIAGNOSIS — R748 Abnormal levels of other serum enzymes: Secondary | ICD-10-CM | POA: Diagnosis not present

## 2022-09-06 DIAGNOSIS — F1020 Alcohol dependence, uncomplicated: Secondary | ICD-10-CM | POA: Diagnosis not present

## 2022-09-07 DIAGNOSIS — F1020 Alcohol dependence, uncomplicated: Secondary | ICD-10-CM | POA: Diagnosis not present

## 2022-09-19 DIAGNOSIS — F1020 Alcohol dependence, uncomplicated: Secondary | ICD-10-CM | POA: Diagnosis not present

## 2022-09-20 ENCOUNTER — Ambulatory Visit
Admission: RE | Admit: 2022-09-20 | Discharge: 2022-09-20 | Disposition: A | Discharge status: Home / Self Care | Payer: Medicare HMO | Source: Ambulatory Visit | Attending: Otolaryngology | Admitting: Otolaryngology

## 2022-09-20 ENCOUNTER — Other Ambulatory Visit (HOSPITAL_COMMUNITY)
Admission: RE | Admit: 2022-09-20 | Discharge: 2022-09-20 | Disposition: A | Discharge status: Home / Self Care | Payer: Medicare HMO | Source: Ambulatory Visit | Attending: Interventional Radiology | Admitting: Interventional Radiology

## 2022-09-20 DIAGNOSIS — E041 Nontoxic single thyroid nodule: Secondary | ICD-10-CM | POA: Insufficient documentation

## 2022-09-21 DIAGNOSIS — F1020 Alcohol dependence, uncomplicated: Secondary | ICD-10-CM | POA: Diagnosis not present

## 2022-09-22 LAB — CYTOLOGY - NON PAP

## 2022-09-23 DIAGNOSIS — F1020 Alcohol dependence, uncomplicated: Secondary | ICD-10-CM | POA: Diagnosis not present

## 2022-10-04 DIAGNOSIS — F1020 Alcohol dependence, uncomplicated: Secondary | ICD-10-CM | POA: Diagnosis not present

## 2022-10-11 DIAGNOSIS — G894 Chronic pain syndrome: Secondary | ICD-10-CM | POA: Diagnosis not present

## 2022-10-11 DIAGNOSIS — Z79899 Other long term (current) drug therapy: Secondary | ICD-10-CM | POA: Diagnosis not present

## 2022-10-11 DIAGNOSIS — Z5181 Encounter for therapeutic drug level monitoring: Secondary | ICD-10-CM | POA: Diagnosis not present

## 2022-10-11 DIAGNOSIS — Z79891 Long term (current) use of opiate analgesic: Secondary | ICD-10-CM | POA: Diagnosis not present

## 2022-10-11 DIAGNOSIS — G479 Sleep disorder, unspecified: Secondary | ICD-10-CM | POA: Diagnosis not present

## 2022-10-11 DIAGNOSIS — Z8659 Personal history of other mental and behavioral disorders: Secondary | ICD-10-CM | POA: Diagnosis not present

## 2022-10-11 DIAGNOSIS — Z6827 Body mass index (BMI) 27.0-27.9, adult: Secondary | ICD-10-CM | POA: Diagnosis not present

## 2022-10-11 DIAGNOSIS — M5136 Other intervertebral disc degeneration, lumbar region: Secondary | ICD-10-CM | POA: Diagnosis not present

## 2022-10-11 DIAGNOSIS — E7800 Pure hypercholesterolemia, unspecified: Secondary | ICD-10-CM | POA: Diagnosis not present

## 2022-10-20 DIAGNOSIS — F330 Major depressive disorder, recurrent, mild: Secondary | ICD-10-CM | POA: Diagnosis not present

## 2022-11-11 DIAGNOSIS — N390 Urinary tract infection, site not specified: Secondary | ICD-10-CM

## 2022-11-11 DIAGNOSIS — D696 Thrombocytopenia, unspecified: Secondary | ICD-10-CM

## 2022-11-15 DIAGNOSIS — F330 Major depressive disorder, recurrent, mild: Secondary | ICD-10-CM | POA: Diagnosis not present

## 2022-12-16 DIAGNOSIS — F1020 Alcohol dependence, uncomplicated: Secondary | ICD-10-CM | POA: Diagnosis not present

## 2023-01-24 DIAGNOSIS — F1020 Alcohol dependence, uncomplicated: Secondary | ICD-10-CM | POA: Diagnosis not present

## 2023-02-02 ENCOUNTER — Ambulatory Visit: Payer: Medicare HMO | Admitting: Podiatry

## 2023-02-02 ENCOUNTER — Ambulatory Visit (INDEPENDENT_AMBULATORY_CARE_PROVIDER_SITE_OTHER): Payer: Medicare HMO

## 2023-02-02 ENCOUNTER — Encounter: Payer: Self-pay | Admitting: Podiatry

## 2023-02-02 DIAGNOSIS — M722 Plantar fascial fibromatosis: Secondary | ICD-10-CM

## 2023-02-02 MED ORDER — TRIAMCINOLONE ACETONIDE 40 MG/ML IJ SUSP
20.0000 mg | Freq: Once | INTRAMUSCULAR | Status: AC
  Administered 2023-02-02: 20 mg

## 2023-02-02 MED ORDER — MELOXICAM 15 MG PO TABS: 15.0000 mg | ORAL_TABLET | Freq: Every day | ORAL | 3 refills | Status: DC

## 2023-02-02 MED ORDER — METHYLPREDNISOLONE 4 MG PO TBPK: ORAL_TABLET | ORAL | 0 refills | Status: DC

## 2023-02-02 NOTE — Patient Instructions (Signed)

## 2023-02-02 NOTE — Progress Notes (Signed)
She presents today chief complaint of painful heel left.  States that been aching a couple of months mornings are particularly bad she does a lot of walking and took time away from walking in January and February and states that her foot started to develop pain like a pulling sensation from the heel radiating out into the mid arch.  She states that she tried exercising ice anti-inflammatories and she has decreased her walking considerably.  Objective: Vital signs are stable she is alert and oriented x 3 pulses are palpable.  There is no erythema no cellulitis drainage or odor.  She does have significant pain on palpation medial calcaneal tubercle of her left heel.  No pain on medial lateral compression of the calcaneus.  No pain on palpation of the Achilles.  Radiographs taken today demonstrate osseously mature individual left foot with better than normal mineralization for her age.  She does have soft tissue swelling at the plantar fascia calcaneal insertion site of the left heel no significant osseous abnormalities are acutely identified.  Assessment: [place] fasciitis left.  Plan: Discussed etiology pathology conservative versus surgical therapies.  I injected the left heel today 20 mg Kenalog 5 g Marcaine point maximal tenderness.  Started her on methylprednisolone to be followed by meloxicam.  Discussed appropriate shoe gear with her.  Will follow-up with her in 1 month if necessary.

## 2023-02-14 DIAGNOSIS — F1020 Alcohol dependence, uncomplicated: Secondary | ICD-10-CM | POA: Diagnosis not present

## 2023-02-21 DIAGNOSIS — F1020 Alcohol dependence, uncomplicated: Secondary | ICD-10-CM | POA: Diagnosis not present

## 2023-02-25 DIAGNOSIS — I10 Essential (primary) hypertension: Secondary | ICD-10-CM | POA: Diagnosis not present

## 2023-02-25 DIAGNOSIS — R000 Tachycardia, unspecified: Secondary | ICD-10-CM | POA: Diagnosis not present

## 2023-02-25 DIAGNOSIS — R0789 Other chest pain: Secondary | ICD-10-CM | POA: Diagnosis not present

## 2023-02-25 DIAGNOSIS — R001 Bradycardia, unspecified: Secondary | ICD-10-CM | POA: Diagnosis not present

## 2023-02-25 DIAGNOSIS — R079 Chest pain, unspecified: Secondary | ICD-10-CM | POA: Diagnosis not present

## 2023-03-07 ENCOUNTER — Other Ambulatory Visit (HOSPITAL_COMMUNITY)
Admission: EM | Admit: 2023-03-07 | Discharge: 2023-03-09 | Disposition: A | Discharge status: Home / Self Care | Payer: Medicare HMO | Attending: Psychiatry | Admitting: Psychiatry

## 2023-03-07 DIAGNOSIS — F10129 Alcohol abuse with intoxication, unspecified: Secondary | ICD-10-CM | POA: Diagnosis not present

## 2023-03-07 DIAGNOSIS — F1010 Alcohol abuse, uncomplicated: Secondary | ICD-10-CM

## 2023-03-07 DIAGNOSIS — R630 Anorexia: Secondary | ICD-10-CM | POA: Insufficient documentation

## 2023-03-07 DIAGNOSIS — R451 Restlessness and agitation: Secondary | ICD-10-CM | POA: Diagnosis not present

## 2023-03-07 DIAGNOSIS — Z1152 Encounter for screening for COVID-19: Secondary | ICD-10-CM | POA: Insufficient documentation

## 2023-03-07 DIAGNOSIS — F1090 Alcohol use, unspecified, uncomplicated: Secondary | ICD-10-CM | POA: Diagnosis present

## 2023-03-07 DIAGNOSIS — Z79899 Other long term (current) drug therapy: Secondary | ICD-10-CM | POA: Diagnosis not present

## 2023-03-07 DIAGNOSIS — F331 Major depressive disorder, recurrent, moderate: Secondary | ICD-10-CM

## 2023-03-07 LAB — COMPREHENSIVE METABOLIC PANEL
ALT: 69 U/L — ABNORMAL HIGH (ref 0–44)
AST: 102 U/L — ABNORMAL HIGH (ref 15–41)
Albumin: 4.4 g/dL (ref 3.5–5.0)
Alkaline Phosphatase: 66 U/L (ref 38–126)
Anion gap: 14 (ref 5–15)
BUN: 13 mg/dL (ref 8–23)
CO2: 26 mmol/L (ref 22–32)
Calcium: 9.5 mg/dL (ref 8.9–10.3)
Chloride: 96 mmol/L — ABNORMAL LOW (ref 98–111)
Creatinine, Ser: 0.63 mg/dL (ref 0.44–1.00)
GFR, Estimated: >60 mL/min (ref >60)
Glucose, Bld: 107 mg/dL — ABNORMAL HIGH (ref 70–99)
Potassium: 4 mmol/L (ref 3.5–5.1)
Sodium: 136 mmol/L (ref 135–145)
Total Bilirubin: 0.8 mg/dL (ref 0.3–1.2)
Total Protein: 7 g/dL (ref 6.5–8.1)

## 2023-03-07 LAB — CBC WITH DIFFERENTIAL/PLATELET
Abs Immature Granulocytes: 0.02 10*3/uL (ref 0.00–0.07)
Basophils Absolute: 0 10*3/uL (ref 0.0–0.1)
Basophils Relative: 1 %
Eosinophils Absolute: 0 10*3/uL (ref 0.0–0.5)
Eosinophils Relative: 1 %
HCT: 43.6 % (ref 36.0–46.0)
Hemoglobin: 14.9 g/dL (ref 12.0–15.0)
Immature Granulocytes: 1 %
Lymphocytes Relative: 48 %
Lymphs Abs: 1.9 10*3/uL (ref 0.7–4.0)
MCH: 33.7 pg (ref 26.0–34.0)
MCHC: 34.2 g/dL (ref 30.0–36.0)
MCV: 98.6 fL (ref 80.0–100.0)
Monocytes Absolute: 0.4 10*3/uL (ref 0.1–1.0)
Monocytes Relative: 10 %
Neutro Abs: 1.5 10*3/uL — ABNORMAL LOW (ref 1.7–7.7)
Neutrophils Relative %: 39 %
Platelets: 102 10*3/uL — ABNORMAL LOW (ref 150–400)
RBC: 4.42 MIL/uL (ref 3.87–5.11)
RDW: 13.5 % (ref 11.5–15.5)
WBC: 3.8 10*3/uL — ABNORMAL LOW (ref 4.0–10.5)
nRBC: 0 % (ref 0.0–0.2)

## 2023-03-07 LAB — HEMOGLOBIN A1C
Hgb A1c MFr Bld: 5.6 % (ref 4.8–5.6)
Mean Plasma Glucose: 114 mg/dL

## 2023-03-07 LAB — ETHANOL: Alcohol, Ethyl (B): 211 mg/dL — ABNORMAL HIGH (ref <10)

## 2023-03-07 LAB — LIPID PANEL
Cholesterol: 368 mg/dL — ABNORMAL HIGH (ref 0–200)
HDL: 110 mg/dL (ref >40)
LDL Cholesterol: UNDETERMINED mg/dL (ref 0–99)
Total CHOL/HDL Ratio: 3.3 RATIO
Triglycerides: 695 mg/dL — ABNORMAL HIGH (ref <150)
VLDL: UNDETERMINED mg/dL (ref 0–40)

## 2023-03-07 LAB — POC SARS CORONAVIRUS 2 AG: SARSCOV2ONAVIRUS 2 AG: NEGATIVE

## 2023-03-07 LAB — LDL CHOLESTEROL, DIRECT: Direct LDL: 172 mg/dL — ABNORMAL HIGH (ref 0–99)

## 2023-03-07 LAB — SARS CORONAVIRUS 2 BY RT PCR: SARS Coronavirus 2 by RT PCR: NEGATIVE

## 2023-03-07 MED ORDER — ADULT MULTIVITAMIN W/MINERALS CH
1.0000 | ORAL_TABLET | Freq: Every day | ORAL | Status: DC
  Administered 2023-03-07 – 2023-03-09 (×3): 1 via ORAL
  Filled 2023-03-07 (×4): qty 1

## 2023-03-07 MED ORDER — THIAMINE MONONITRATE 100 MG PO TABS
100.0000 mg | ORAL_TABLET | Freq: Every day | ORAL | Status: DC
  Administered 2023-03-08 – 2023-03-09 (×2): 100 mg via ORAL
  Filled 2023-03-07 (×2): qty 1

## 2023-03-07 MED ORDER — HYDROXYZINE HCL 25 MG PO TABS
25.0000 mg | ORAL_TABLET | Freq: Four times a day (QID) | ORAL | Status: DC | PRN
  Administered 2023-03-07 – 2023-03-08 (×2): 25 mg via ORAL
  Filled 2023-03-07 (×2): qty 1

## 2023-03-07 MED ORDER — LORAZEPAM 1 MG PO TABS: 1.0000 mg | ORAL_TABLET | Freq: Every day | ORAL | Status: DC

## 2023-03-07 MED ORDER — LORAZEPAM 1 MG PO TABS
1.0000 mg | ORAL_TABLET | Freq: Four times a day (QID) | ORAL | Status: AC
  Administered 2023-03-07 – 2023-03-08 (×6): 1 mg via ORAL
  Filled 2023-03-07 (×6): qty 1

## 2023-03-07 MED ORDER — LORAZEPAM 1 MG PO TABS
1.0000 mg | ORAL_TABLET | Freq: Three times a day (TID) | ORAL | Status: DC
  Administered 2023-03-09: 1 mg via ORAL
  Filled 2023-03-07 (×2): qty 1

## 2023-03-07 MED ORDER — THIAMINE HCL 100 MG/ML IJ SOLN
100.0000 mg | Freq: Once | INTRAMUSCULAR | Status: AC
  Administered 2023-03-07: 100 mg via INTRAMUSCULAR
  Filled 2023-03-07: qty 2

## 2023-03-07 MED ORDER — LOPERAMIDE HCL 2 MG PO CAPS
2.0000 mg | ORAL_CAPSULE | ORAL | Status: DC | PRN
  Administered 2023-03-08 – 2023-03-09 (×3): 2 mg via ORAL
  Filled 2023-03-07 (×3): qty 1

## 2023-03-07 MED ORDER — LORAZEPAM 1 MG PO TABS: 1.0000 mg | ORAL_TABLET | Freq: Two times a day (BID) | ORAL | Status: DC

## 2023-03-07 MED ORDER — ONDANSETRON 4 MG PO TBDP: 4.0000 mg | ORAL_TABLET | Freq: Four times a day (QID) | ORAL | Status: DC | PRN

## 2023-03-07 MED ORDER — METOPROLOL SUCCINATE ER 50 MG PO TB24
50.0000 mg | ORAL_TABLET | Freq: Every day | ORAL | Status: DC
  Administered 2023-03-07 – 2023-03-08 (×2): 50 mg via ORAL
  Filled 2023-03-07 (×2): qty 1

## 2023-03-07 MED ORDER — ALUM & MAG HYDROXIDE-SIMETH 200-200-20 MG/5ML PO SUSP
30.0000 mL | ORAL | Status: DC | PRN
  Administered 2023-03-09: 30 mL via ORAL
  Filled 2023-03-07: qty 30

## 2023-03-07 MED ORDER — ACETAMINOPHEN 325 MG PO TABS
650.0000 mg | ORAL_TABLET | Freq: Four times a day (QID) | ORAL | Status: DC | PRN
  Administered 2023-03-07 – 2023-03-09 (×3): 650 mg via ORAL
  Filled 2023-03-07 (×3): qty 2

## 2023-03-07 MED ORDER — MAGNESIUM HYDROXIDE 400 MG/5ML PO SUSP: 30.0000 mL | Freq: Every day | ORAL | Status: DC | PRN

## 2023-03-07 MED ORDER — FLUOXETINE HCL 20 MG PO CAPS
20.0000 mg | ORAL_CAPSULE | Freq: Every day | ORAL | Status: DC
  Administered 2023-03-07 – 2023-03-09 (×3): 20 mg via ORAL
  Filled 2023-03-07 (×3): qty 1

## 2023-03-07 MED ORDER — TRAZODONE HCL 100 MG PO TABS
100.0000 mg | ORAL_TABLET | Freq: Every evening | ORAL | Status: DC | PRN
  Administered 2023-03-07 – 2023-03-08 (×2): 100 mg via ORAL
  Filled 2023-03-07 (×2): qty 1

## 2023-03-07 MED ORDER — LORAZEPAM 1 MG PO TABS
1.0000 mg | ORAL_TABLET | Freq: Four times a day (QID) | ORAL | Status: DC | PRN
  Administered 2023-03-08: 1 mg via ORAL

## 2023-03-07 NOTE — Group Note (Unsigned)
Group Topic: Social Support  Group Date: 03/07/2023 Start Time: 1000 End Time: [place] Facilitators: Quentin Cornwall B  Department: [facility]  Number of Participants: 7  Group Focus: relapse prevention Treatment Modality:  Psychoeducation Interventions utilized were support Purpose: relapse prevention strategies   Name: [name] Date of Birth: 1961/06/04  MR: [id number]    Level of Participation: {THERAPIES; PSYCH GROUP PARTICIPATION [id number] Quality of Participation: {THERAPIES; PSYCH QUALITY OF PARTICIPATION:23992} Interactions with others: {THERAPIES; PSYCH INTERACTIONS:23993} Mood/Affect: {THERAPIES; PSYCH MOOD/AFFECT:23994} Triggers (if applicable): *** Cognition: {THERAPIES; PSYCH COGNITION:23995} Progress: {THERAPIES; PSYCH PROGRESS:23997} Response: *** Plan: {THERAPIES; PSYCH [id number]  Patients Problems:  Patient Active Problem List   Diagnosis Date Noted   Alcohol use disorder 03/07/2023   Thrombocytopenia 11/11/2022   Urinary tract infection without hematuria 11/11/2022   Thyroid nodule 08/11/2022   Pelvic and perineal pain 05/22/2022   Abdominal bloating 05/22/2022   Elevated levels of transaminase & lactic acid dehydrogenase 05/22/2022   Colon cancer screening 05/22/2022   Substance induced mood disorder 05/16/2022   MDD (major depressive disorder) 05/15/2022   Alcohol dependence    Substance-induced disorder    Degeneration of lumbar intervertebral disc 05/10/2018   On long term drug therapy 12/07/2017   Chronic low back pain 12/07/2017   Post traumatic stress disorder (PTSD) 09/12/2014   Family history of alcoholism in father 09/12/2014   Alcohol dependence with uncomplicated withdrawal [id number]   Dysmenorrhea 03/19/2014   GERD 11/03/2008   MIGRAINE HEADACHE 12/20/2007   HYPERTENSION 12/20/2007   SLEEP DISORDER 12/20/2007   HEADACHE 12/20/2007

## 2023-03-07 NOTE — Group Note (Signed)
Group Topic: Social Support  Group Date: 03/07/2023 Start Time: 1015 End Time: 1045 Facilitators: Quentin Cornwall B  Department: [facility]  Number of Participants: 7  Group Focus: relapse prevention and social skills Treatment Modality:  Psychoeducation Interventions utilized were support Purpose: relapse prevention strategies  Name: [name] Date of Birth: [birth year]  MR: [id number]    Level of Participation: moderate Quality of Participation: attentive and cooperative Interactions with others: gave feedback Mood/Affect: positive Triggers (if applicable): na Cognition: coherent/clear Progress: Moderate Response: na Plan: follow-up needed  Patients Problems:  Patient Active Problem List   Diagnosis Date Noted   Alcohol use disorder 03/07/2023   Thrombocytopenia 11/11/2022   Urinary tract infection without hematuria 11/11/2022   Thyroid nodule 08/11/2022   Pelvic and perineal pain 05/22/2022   Abdominal bloating 05/22/2022   Elevated levels of transaminase & lactic acid dehydrogenase 05/22/2022   Colon cancer screening 05/22/2022   Substance induced mood disorder 05/16/2022   MDD (major depressive disorder) 05/15/2022   Alcohol dependence    Substance-induced disorder    Degeneration of lumbar intervertebral disc 05/10/2018   On long term drug therapy 12/07/2017   Chronic low back pain 12/07/2017   Post traumatic stress disorder (PTSD) 09/12/2014   Family history of alcoholism in father 09/12/2014   Alcohol dependence with uncomplicated withdrawal [id number]   Dysmenorrhea 03/19/2014   GERD 11/03/2008   MIGRAINE HEADACHE 12/20/2007   HYPERTENSION 12/20/2007   SLEEP DISORDER 12/20/2007   HEADACHE 12/20/2007

## 2023-03-07 NOTE — ED Notes (Signed)
Pt is currently sleeping, no distress noted, environmental check complete, will continue to monitor patient for safety.  

## 2023-03-07 NOTE — ED Provider Notes (Signed)
Behavioral Health Progress Note  Date and Time: 03/07/2023 9:36 PM Name: [name] MRN:  [id number]  Subjective:  [name] is a 62 y/o female with a psychiatric history of alcohol abuse and depression presenting voluntarily to [facility] for evaluation and treatment of worsening depression and anxiety symptoms in context of alcohol use disorder, for which she was admitted to [facility]  On reassessment, patient reiterates that she would like assistance with detox and to continue services at [facility], where she receives therapy weekly. Further discussion reveals that patient has low mood, low energy, dec appetite in the setting of loss of relationship. She does not have a plan as to how housing will look after the breakup. Patient endorses passive SI but is able to contract for safety on the unit. She denies HI, AVH, and paranoia.   We discuss how patient may not be stable enough to detox and return to the same situation without a plan. As well, how it may be more beneficial for her to complete a residential treatment program prior to returning home, and she voices understanding and agreement.  Diagnosis:  Final diagnoses:  Alcohol abuse  MDD (major depressive disorder), recurrent episode, moderate    Total Time spent with patient: 30 minutes  Past Psychiatric History: previously being followed by Dr. Toy Care at [facility] in and was most recently prescribed Prozac 20 mg and trazodone 100 mg.  Patient states that she stopped taking the Prozac a few months ago because she was worried about the interaction between the Prozac and her drinking.  Patient reports she was going to therapy with [facility] but is no longer going to therapy. previous inpatient treatment for alcohol abuse 05/16/22-05/26/22 at [facility].  Past Medical History: Hypertension, degenerative disc disease, migraines Family History: Mother-depression, Father-alcohol abuse Social History: 63 y/o single female on  disability for degenerative disc disease, lived with partner her [name] but currently going through a breakup  Additional Social History:    Pain Medications: See MAR  Prescriptions: See MAR  Over the Counter: See MAR  History of alcohol / drug use?: Yes Longest period of sobriety (when/how long): 1 year Negative Consequences of Use: Personal relationships, Financial Withdrawal Symptoms: Tremors, Sweats Name of Substance 1: alcohol 1 - Age of First Use: 56 1 - Amount (size/oz): 1 bottle of wine 1 - Frequency: daily 1 - Last Use / Amount: today                  Sleep: Poor  Appetite:  Poor  Current Medications:  Current Facility-Administered Medications  Medication Dose Route Frequency Provider Last Rate Last Admin   acetaminophen (TYLENOL) tablet 650 mg  650 mg Oral Q6H PRN Bobbitt, Shalon E, NP   650 mg at 03/07/23 0815   alum & mag hydroxide-simeth (MAALOX/MYLANTA) 200-200-20 MG/5ML suspension 30 mL  30 mL Oral Q4H PRN Bobbitt, Shalon E, NP       FLUoxetine (PROZAC) capsule 20 mg  20 mg Oral Daily Bobbitt, Shalon E, NP   20 mg at 03/07/23 [id number]   hydrOXYzine (ATARAX) tablet 25 mg  25 mg Oral Q6H PRN Bobbitt, Shalon E, NP   25 mg at 03/07/23 0704   loperamide (IMODIUM) capsule 2-4 mg  2-4 mg Oral PRN Bobbitt, Shalon E, NP       LORazepam (ATIVAN) tablet 1 mg  1 mg Oral Q6H PRN Bobbitt, Shalon E, NP       LORazepam (ATIVAN) tablet 1  mg  1 mg Oral QID Bobbitt, Shalon E, NP   1 mg at 03/07/23 2125   Followed by   Derrill Memo ON 03/08/2023] LORazepam (ATIVAN) tablet 1 mg  1 mg Oral TID Bobbitt, Shalon E, NP       Followed by   Derrill Memo ON 03/09/2023] LORazepam (ATIVAN) tablet 1 mg  1 mg Oral BID Bobbitt, Shalon E, NP       Followed by   Derrill Memo ON 03/11/2023] LORazepam (ATIVAN) tablet 1 mg  1 mg Oral Daily Bobbitt, Shalon E, NP       magnesium hydroxide (MILK OF MAGNESIA) suspension 30 mL  30 mL Oral Daily PRN Bobbitt, Shalon E, NP       metoprolol succinate (TOPROL-XL) 24 hr tablet  50 mg  50 mg Oral QHS Bobbitt, Shalon E, NP   50 mg at 03/07/23 2125   multivitamin with minerals tablet 1 tablet  1 tablet Oral Daily Bobbitt, Shalon E, NP   1 tablet at 03/07/23 0921   ondansetron (ZOFRAN-ODT) disintegrating tablet 4 mg  4 mg Oral Q6H PRN Bobbitt, Shalon E, NP       [START ON 03/08/2023] thiamine (VITAMIN B1) tablet 100 mg  100 mg Oral Daily Bobbitt, Shalon E, NP       traZODone (DESYREL) tablet 100 mg  100 mg Oral QHS PRN Bobbitt, Shalon E, NP   100 mg at 03/07/23 2125   Current Outpatient Medications  Medication Sig Dispense Refill   estradiol (VIVELLE-DOT) 0.0375 MG/24HR Place 1 patch onto the skin 2 (two) times a week.     FLUoxetine (PROZAC) 20 MG capsule Take 1 capsule (20 mg total) by mouth daily. 30 capsule 0   metoprolol succinate (TOPROL-XL) 50 MG 24 hr tablet Take 50 mg by mouth at bedtime.     oxyCODONE-acetaminophen (PERCOCET/ROXICET) 5-325 MG tablet Take 1 tablet by mouth 4 (four) times daily as needed (For pain).     rizatriptan (MAXALT) 5 MG tablet Take 10 mg by mouth See admin instructions. Take 2 tablets (10 mg) by mouth at onset of migraine, may repeat in 2 hours if still needed     traZODone (DESYREL) 100 MG tablet Take 1 tablet (100 mg total) by mouth at bedtime as needed for sleep. (Patient taking differently: Take 100 mg by mouth at bedtime.) 30 tablet 0    Labs  Lab Results:  Admission on 03/07/2023  Component Date Value Ref Range Status   Sodium 03/07/2023 136  135 - 145 mmol/L Final   Potassium 03/07/2023 4.0  3.5 - 5.1 mmol/L Final   Chloride 03/07/2023 96 (L)  98 - 111 mmol/L Final   CO2 03/07/2023 26  22 - 32 mmol/L Final   Glucose, Bld 03/07/2023 107 (H)  70 - 99 mg/dL Final   Glucose reference range applies only to samples taken after fasting for at least 8 hours.   BUN 03/07/2023 13  8 - 23 mg/dL Final   Creatinine, Ser 03/07/2023 0.63  0.44 - 1.00 mg/dL Final   Calcium 03/07/2023 9.5  8.9 - 10.3 mg/dL Final   Total Protein 03/07/2023 7.0   6.5 - 8.1 g/dL Final   Albumin 03/07/2023 4.4  3.5 - 5.0 g/dL Final   AST 03/07/2023 102 (H)  15 - 41 U/L Final   ALT 03/07/2023 69 (H)  0 - 44 U/L Final   Alkaline Phosphatase 03/07/2023 66  38 - 126 U/L Final   Total Bilirubin 03/07/2023 0.8  0.3 - 1.2 mg/dL  Final   GFR, Estimated 03/07/2023 >60  >60 mL/min Final   Comment: (NOTE) Calculated using the CKD-EPI Creatinine Equation (2021)    Anion gap 03/07/2023 14  5 - 15 Final   Performed at [facility], [place] [address], [place], [place] [address]   WBC 03/07/2023 3.8 (L)  4.0 - 10.5 K/uL Final   RBC 03/07/2023 4.42  3.87 - 5.11 MIL/uL Final   Hemoglobin 03/07/2023 14.9  12.0 - 15.0 g/dL Final   HCT 03/07/2023 43.6  36.0 - 46.0 % Final   MCV 03/07/2023 98.6  80.0 - 100.0 fL Final   MCH 03/07/2023 33.7  26.0 - 34.0 pg Final   MCHC 03/07/2023 34.2  30.0 - 36.0 g/dL Final   RDW 03/07/2023 13.5  11.5 - 15.5 % Final   Platelets 03/07/2023 102 (L)  150 - 400 K/uL Final   Comment: Immature Platelet Fraction may be clinically indicated, consider ordering this additional test [id number] REPEATED TO VERIFY    nRBC 03/07/2023 0.0  0.0 - 0.2 % Final   Neutrophils Relative % 03/07/2023 39  % Final   Neutro Abs 03/07/2023 1.5 (L)  1.7 - 7.7 K/uL Final   Lymphocytes Relative 03/07/2023 48  % Final   Lymphs Abs 03/07/2023 1.9  0.7 - 4.0 K/uL Final   Monocytes Relative 03/07/2023 10  % Final   Monocytes Absolute 03/07/2023 0.4  0.1 - 1.0 K/uL Final   Eosinophils Relative 03/07/2023 1  % Final   Eosinophils Absolute 03/07/2023 0.0  0.0 - 0.5 K/uL Final   Basophils Relative 03/07/2023 1  % Final   Basophils Absolute 03/07/2023 0.0  0.0 - 0.1 K/uL Final   Immature Granulocytes 03/07/2023 1  % Final   Abs Immature Granulocytes 03/07/2023 0.02  0.00 - 0.07 K/uL Final   Performed at [facility], [place] [address] [place], [place] [address]   Alcohol, Ethyl (B) 03/07/2023 211 (H)  <10 mg/dL Final   Comment: (NOTE) Lowest detectable limit  for serum alcohol is 10 mg/dL.  For medical purposes only. Performed at [facility], [place] [address] [place], [place] [address]    Cholesterol 03/07/2023 368 (H)  0 - 200 mg/dL Final   Triglycerides 03/07/2023 695 (H)  <150 mg/dL Final   HDL 03/07/2023 110  >40 mg/dL Final   Total CHOL/HDL Ratio 03/07/2023 3.3  RATIO Final   VLDL 03/07/2023 UNABLE TO CALCULATE IF TRIGLYCERIDE OVER 400 mg/dL  0 - 40 mg/dL Final   LDL Cholesterol 03/07/2023 UNABLE TO CALCULATE IF TRIGLYCERIDE OVER 400 mg/dL  0 - 99 mg/dL Final   Comment:        Total Cholesterol/HDL:CHD Risk Coronary Heart Disease Risk Table                     Men   Women  1/2 Average Risk   3.4   3.3  Average Risk       5.0   4.4  2 X Average Risk   9.6   7.1  3 X Average Risk  23.4   11.0        Use the calculated Patient Ratio above and the CHD Risk Table to determine the patient's CHD Risk.        ATP III CLASSIFICATION (LDL):  <100     mg/dL   Optimal  100-129  mg/dL   Near or Above  Optimal  130-159  mg/dL   Borderline  160-189  mg/dL   High  >190     mg/dL   Very High Performed at [facility] [address] [place], [place] [address]    SARSCOV2ONAVIRUS 2 AG 03/07/2023 NEGATIVE  NEGATIVE Final   Comment: (NOTE) SARS-CoV-2 antigen NOT DETECTED.   Negative results are presumptive.  Negative results do not preclude SARS-CoV-2 infection and should not be used as the sole basis for treatment or other patient management decisions, including infection  control decisions, particularly in the presence of clinical signs and  symptoms consistent with COVID-19, or in those who have been in contact with the virus.  Negative results must be combined with clinical observations, patient history, and epidemiological information. The expected result is Negative.  Fact Sheet for Patients: HandmadeRecipes.com.cy  Fact Sheet for Healthcare  Providers: FuneralLife.at  This test is not yet approved or cleared by the [place] FDA and  has been authorized for detection and/or diagnosis of SARS-CoV-2 by FDA under an Emergency Use Authorization (EUA).  This EUA will remain in effect (meaning this test can be used) for the duration of  the COV                          ID-19 declaration under Section 564(b)(1) of the Act, 21 U.S.C. section 360bbb-3(b)(1), unless the authorization is terminated or revoked sooner.     SARS Coronavirus 2 by RT PCR 03/07/2023 NEGATIVE  NEGATIVE Final   Performed at [facility], [place] [address] [place], [place] [address]   Direct LDL 03/07/2023 172 (H)  0 - 99 mg/dL Final   Performed at [facility] [address] [place], [place] [address]  Hospital Outpatient Visit on 09/20/2022  Component Date Value Ref Range Status   CYTOLOGY - NON GYN 09/20/2022    Final-Edited                   Value:CYTOLOGY - NON PAP CASE: [id number] PATIENT: [name] Non-Gynecological Cytology Report     Clinical History: Nodule #1: Left; Inferior, Maximum size: 3.3 cm; Other 2 dimensions: 1.8 x 1.4 cm, solid/almost completely solid, isoechoic, TI-RADS total points: 3. Specimen Submitted:  A. THYROID, LT INFERIOR, FINE NEEDLE ASPIRATION:   FINAL MICROSCOPIC DIAGNOSIS: - Consistent with benign follicular nodule (Bethesda category II)  SPECIMEN ADEQUACY: Satisfactory for evaluation  GROSS: Received is/are 30 cc's of pale pink cytolyt solution and 6 slides in 95% ethyl alcohol. (EMH:emh) Prepared: Smears:  6 Concentration Method (Thin Prep):  1 Cell Block:  Cell block attempted, not obtained. Additional Studies: Afirma collected.     Final Diagnosis performed by Tilford Pillar DO.   Electronically signed 09/22/2022 Technical component performed at [employer]. [facility], [place] [address] [place], [place] [address].  Professional component  performed at                          [facility], [place] [address], [place], [place] [address].  Immunohistochemistry Technical component (if applicable) was performed at [facility]. [address] [place], [place], [place] [address].   IMMUNOHISTOCHEMISTRY DISCLAIMER (if applicable): Some of these immunohistochemical stains may have been developed and the performance characteristics determine by [facility]. Some may not have been cleared or approved by the U.S. Food and Drug Administration. The FDA has determined that such  clearance or approval is not necessary. This test is used for clinical purposes. It should not be regarded as investigational or for research. This laboratory is certified under the [place] (CLIA-88) as qualified to perform high complexity clinical laboratory testing.  The controls stained appropriately.     Blood Alcohol level:  Lab Results  Component Value Date   ETH 211 (H) 03/07/2023   ETH 309 (HH) [id number]    Metabolic Disorder Labs: Lab Results  Component Value Date   HGBA1C 5.3 05/25/2022   MPG 105.41 05/25/2022   No results found for: "PROLACTIN" Lab Results  Component Value Date   CHOL 368 (H) 03/07/2023   TRIG 695 (H) 03/07/2023   HDL 110 03/07/2023   CHOLHDL 3.3 03/07/2023   VLDL UNABLE TO CALCULATE IF TRIGLYCERIDE OVER 400 mg/dL 03/07/2023   LDLCALC UNABLE TO CALCULATE IF TRIGLYCERIDE OVER 400 mg/dL 03/07/2023   LDLCALC 99 05/15/2022    Therapeutic Lab Levels: No results found for: "LITHIUM" No results found for: "VALPROATE" No results found for: "CBMZ"  Physical Findings   AIMS    Flowsheet Row ED to Hosp-Admission (Discharged) from 05/16/2022 in [facility] [place] 400B  AIMS Total Score 0      AUDIT    Flowsheet Row ED to Hosp-Admission (Discharged) from 05/16/2022 in [place]  400B  Alcohol Use Disorder Identification Test Final Score (AUDIT) 14      PHQ2-9    Flowsheet Row ED from 03/07/2023 in [facility] ED from 05/15/2022 in [facility]  PHQ-2 Total Score 2 4  PHQ-9 Total Score 6 18      [place] ED from 03/07/2023 in [facility] ED to Hosp-Admission (Discharged) from 05/16/2022 in [place] 400B ED from 05/15/2022 in [place] No Risk No Risk No Risk        Musculoskeletal  Strength & Muscle Tone: within normal limits Gait & Station: normal Patient leans: N/A  Psychiatric Specialty Exam  Presentation  General Appearance:  Casual  Eye Contact: Good  Speech: Clear and Coherent  Speech Volume: Normal  Handedness: Right   Mood and Affect  Mood: Anxious; Depressed; Hopeless  Affect: Tearful   Thought Process  Thought Processes: Coherent  Descriptions of Associations:Intact  Orientation:Full (Time, Place and Person)  Thought Content:Logical  Diagnosis of Schizophrenia or Schizoaffective disorder in past: No    Hallucinations:Hallucinations: None  Ideas of Reference:None  Suicidal Thoughts:Suicidal Thoughts: No  Homicidal Thoughts:Homicidal Thoughts: No   Sensorium  Memory: Immediate Fair; Recent Fair; Remote Fair  Judgment: Fair  Insight: Fair   Community education officer  Concentration: Fair  Attention Span: Fair  Recall: Good  Fund of Knowledge: Good  Language: Good   Psychomotor Activity  Psychomotor Activity: Psychomotor Activity: Normal   Assets  Assets: Communication Skills; Physical Health; Social Support   Sleep  Sleep: Sleep: Fair Number of Hours of Sleep: -1   Nutritional Assessment (For OBS and FBC admissions only) Has the patient had a decrease in food intake/or appetite?: No Does the patient have dental  problems?: No Does the patient have eating habits or behaviors that may be indicators of an eating disorder including binging or inducing vomiting?: No Has the patient recently lost weight without trying?: 0 Has the patient been eating poorly because of a decreased appetite?: 1 Malnutrition Screening Tool Score: 1  Physical Exam  Physical Exam ROS Blood pressure (!) 140/90, pulse (!) 106, temperature 98.5 F (36.9 C), temperature source Oral, resp. rate 16, last menstrual period 02/09/2014, SpO2 98 %. There is no height or weight on file to calculate BMI.  Treatment Plan Summary: Daily contact with patient to assess and evaluate symptoms and progress in treatment and Medication management Feiga Dhawan is a 62 year old female presenting for detox from alcohol use. She voices agreement with residential substance use treatment today but will continue to assess tomorrow.   AUD -Continue CIWA protocol for monitoring of withdrawal with po thiamine and MVI replacement, Ativan taper, and Ativan 1mg  for scores >10.  MDD -Continue Prozac 20 mg daily  HTN -Continue home metoprolol XL 50 mg qHS  Continue PRN's: Tylenol, Maalox, Atarax, Milk of Magnesia, Trazodone, Loperamide, and Zofran   Dispo: Pending  Rosezetta Schlatter, MD 03/07/2023 9:36 PM

## 2023-03-07 NOTE — ED Notes (Signed)
Pt is A & O x 4. Pt is oriented to the unit and provided with meal. Pt cries and state she is scared but unable to say what is making her scared. Medication has been administered to him and pt is lying on his bed. Pt denies SI/HI/AVH. Will continue to monitor for safety.

## 2023-03-07 NOTE — ED Notes (Signed)
Pt sleeping in no acute distress. RR even and unlabored. Environment secured. Will continue to monitor for safety. 

## 2023-03-07 NOTE — ED Triage Notes (Signed)
Pt presents to [facility] voluntarily,unaccompanied with requesting alcohol detox. Pt reports drinking daily and last consumed 2 glasses a wine about 3 hours ago. Pt reports relationship issues and stress as current stressors. Pt is complaining of mild chest pain and pressure, but believes it to be due to stress. Pt has history of MDD and was admitted to [facility] in 2023. Pt denies SI, HI, AVH.

## 2023-03-07 NOTE — ED Notes (Signed)
Patient A&Ox4. Denies intent to harm self/others when asked. Denies A/VH. Patient denies any physical complaints when asked. Pt tearful. Pt states, "I came in here on my own. I want to stop using ETOH to solve my problems. It's hard though". Support and encouragement provided. Denies withdrawal sx at present. Routine safety checks conducted according to facility protocol. Encouraged patient to notify staff if thoughts of harm toward self or others arise. Patient verbalize understanding and agreement. Will continue to monitor for safety.

## 2023-03-07 NOTE — Tx Team (Signed)
LCSW met with patient to assess current mood, affect, physical state, and inquire about needs/goals while here in [facility] and after discharge. Patient reports she presented due to needing to detox from alcohol. Patient reports she has been drinking daily for the last 2 months. Patient reports she drinks a box of wine that lasts her at least 2-3 days. Patient denies any current withdrawal symptoms other than chest pain. Patient reports her current stressors is strained relationship with significant other, lack of support, and inability to cover rent. Patient later reported that she receives $1500 a month in disability. Patient denies any current legal charges or upcoming court dates. Patient denies having any social support and reports little contact with her family. Patient reports her current goal is to detox and then return home once stable. Patient reports she is being seen by Therapist Gwinda Passe at the [facility] once a week and an MD there for medication management.  Patient reports feeling groggy and needing to sleep. Patient aware that team will follow up with her on tomorrow to further discuss plan and additional services to put in place. Patient expressed understanding. No other needs were reported at this time by patient.    Lucius Conn, LCSW Clinical Social Worker [facility] Ph: [phone]
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
## 2023-03-07 NOTE — Discharge Instructions (Signed)
[facility] ([employer] insurance) [address] [place], [place] [address] [phone] ((CD-IOP (morning & evening programs); individual therapy))  [facility] [place], [place], [address] [phone] phone   New Patient Assessment/Therapy Walk-Ins:  Monday and Wednesday: 8 am until slots are full. Every 1st and 2nd Fridays of the month: 1 pm - 5 pm.  NO ASSESSMENT/THERAPY WALK-INS ON [place]  New Patient Assessment/Medication Management Walk-Ins:  Monday - Friday:  8 am - 11 am.  For all walk-ins, we ask that you arrive by 7:30 am because patients will be seen in the order of arrival.  Availability is limited; therefore, you may not be seen on the same day that you walk-in.  Our goal is to serve and meet the needs of our community to the best of our ability.  12 STEP PROGRAMS:  Alcoholics Anonymous of [place] ReportZoo.com.cy  Narcotics Anonymous of [place] GreenScrapbooking.dk  Al-Anon of [facility], [place] www.[place]alanon.org/find-meetings.html  Nar-Anon https://nar-anon.org/find-a-meetin  Residential placement  [place]: [phone] [facility] [place]: [place], [place] in [place], [place]: [phone]

## 2023-03-07 NOTE — ED Provider Notes (Signed)
Facility Based Crisis Admission H&P  Date: 03/07/23 Patient Name: [name] MRN: [id number] Chief Complaint: I want to stop drinking alcohol  Diagnoses:  Final diagnoses:  Alcohol abuse  MDD (major depressive disorder), recurrent episode, moderate    [id number] [name] is a 62 y/o female with a psychiatric history of alcohol abuse and depression presenting voluntarily to [facility] for evaluation and treatment of worsening depression and anxiety symptoms in context of alcohol use disorder.  Patient states that she wants help to stop drinking. Patient reports that she drove herself today [facility].  Patient is being assessed face-to-face by this provider and chart reviewed.  Patient is alert oriented x 3, speech is clear with normal rate and tone, thought process is linear, coherent and goal-directed.  Patient's mood is depressed and anxious with congruent affect.  Patient is very tearful throughout assessment.   Patient reports that she lives with her partner of 14 years Cannon Kettle [id number] and she has two adult sons [name] and Lysbeth Galas.  Patient reports that she drinks every day about 1 bottle of wine. Patient states, "I need to get off alcohol".  Patient reports that she initially started drinking alcohol daily in 2000 when her husband left her. Patient states her husband was having multiple affairs.  Patient states that she used to work as a Corporate treasurer and she felt bullied at work by her coworkers and she never stood up for herself.  Patient reports that she is on disability for degenerative disc disease and lives with her partner and they argue a lot.  Patient reports that she has a lot of conflict with her partner and feels that her partner "gas lights me and tries to manipulate me".  Patient reports that her and her partner have been together about 14 years.  Patient states her partner recently left the home and she feels abandoned by her partner.  Patient states that she does want to  leave her partner due to all the conflicts that they have but she is not able to afford to pay the mortgage with her disability income.  Patient endorses feeling hopeless, worthless, guilt, low self-esteem, poor appetite and poor sleep.  Patient also stated that she a difficult relationship with her mother and that her mother constantly puts her down and argues with her.  Patient denies any history of seizures or blacking out when drinking.  Patient reports that she was previously being followed by Dr. Toy Care at [facility] in and was most recently prescribed Prozac 20 mg and trazodone 100 mg.  Patient states that she stopped taking the Prozac a few months ago because she was worried about the interaction between the Prozac and her drinking.  Patient reports she was going to therapy with [facility] but is no longer going to therapy.  Patient has had previous inpatient treatment for alcohol abuse 05/16/22-05/26/22 at [facility].   Per H&P) [name] is a 62 y.o., female with a past psychiatric history significant for depression and alcohol dependence who [facility] emergency room for evaluation and management of alcohol intoxication, substance-induced mood disorder. Since arrival on the unit from emergency room patient has been agitated psychotic and disoriented received multiple doses of Zyprexa and Geodon as well as Ativan to help calm her here, she is in seclusion room secondary to agitation, not being redirectable running in the hallway getting into another patient's room disruptive wanting to leave later  reportedly confused responding to stimuli hallucinating with auditory and visual hallucinations reported. During interview today on the inpatient unit, the patient is confused and disoriented unable to provide any reliable history information except very limited she is oriented to being on behavioral health unit but confused about day or month or  year disoriented to situation unable to tell me how she ended up in the hospital apparently having visual hallucinations during evaluation saying she is seeing a baby on the floor in the seclusion room and talking to her friend "I am talking to [employer] making plans" she is noted to have multiple bruises, hyper and agitated hard to redirect but no physical aggression noted at time of my evaluation, she does appear responding to stimuli as noted above, psychotic and delusional, she is unable to provide any reliable history regarding her home medications or medical problems.  Most history information noted below are obtained from patient's son over the phone and per chart review. Patient reportedly had worsening depression recently as reported by her son over the phone but unable to assess symptoms of depression mania or PTSD secondary to ongoing confusion.  Patient denies any SI HI or AVH. Patient will be admitted [facility] for crisis management, stabilization and to safely detox from alcohol.    PHQ 2-9:  Flowsheet Row ED from 03/07/2023 in [facility] ED from 05/15/2022 in [facility]  Thoughts that you would be better off dead, or of hurting yourself in some way Not at all Nearly every day  PHQ-9 Total Score 6 18       [place] ED from 03/07/2023 in [facility] ED to Hosp-Admission (Discharged) from 05/16/2022 in [facility] ED from 05/15/2022 in [place] No Risk No Risk No Risk        Total Time spent with patient: 30 minutes  Musculoskeletal  Strength & Muscle Tone: within normal limits Gait & Station: normal Patient leans: N/A  Psychiatric Specialty Exam  Presentation General Appearance:  Casual  Eye Contact: Good  Speech: Clear and Coherent  Speech Volume: Normal  Handedness: Right   Mood and Affect   Mood: Anxious; Depressed; Hopeless  Affect: Tearful   Thought Process  Thought Processes: Coherent  Descriptions of Associations:Intact  Orientation:Full (Time, Place and Person)  Thought Content:Logical  Diagnosis of Schizophrenia or Schizoaffective disorder in past: No   Hallucinations:Hallucinations: None  Ideas of Reference:None  Suicidal Thoughts:Suicidal Thoughts: No  Homicidal Thoughts:Homicidal Thoughts: No   Sensorium  Memory: Immediate Fair; Recent Fair; Remote Fair  Judgment: Fair  Insight: Fair   Community education officer  Concentration: Fair  Attention Span: Fair  Recall: Good  Fund of Knowledge: Good  Language: Good   Psychomotor Activity  Psychomotor Activity: Psychomotor Activity: Normal   Assets  Assets: Communication Skills; Physical Health; Social Support   Sleep  Sleep: Sleep: Fair Number of Hours of Sleep: -1   Nutritional Assessment (For OBS and FBC admissions only) Has the patient had a decrease in food intake/or appetite?: No Does the patient have dental problems?: No Does the patient have eating habits or behaviors that may be indicators of an eating disorder including binging or inducing vomiting?: No Has the patient recently lost weight without trying?: 0 Has the patient been eating poorly because of a decreased appetite?: 1 Malnutrition Screening Tool Score: 1    Physical Exam HENT:  Head: Normocephalic and atraumatic.     Nose: Nose normal.  Eyes:     Pupils: Pupils are equal, round, and reactive to light.  Cardiovascular:     Rate and Rhythm: Normal rate.     Pulses: Normal pulses.  Pulmonary:     Effort: Pulmonary effort is normal.  Abdominal:     General: Abdomen is flat.  Musculoskeletal:        General: Normal range of motion.     Cervical back: Normal range of motion.  Skin:    General: Skin is warm.  Neurological:     Mental Status: She is alert and oriented to person, place, and  time.  Psychiatric:        Attention and Perception: Attention normal.        Mood and Affect: Mood is anxious and depressed. Affect is flat.        Speech: Speech normal.        Behavior: Behavior is cooperative.        Thought Content: Thought content is not paranoid or delusional. Thought content does not include homicidal or suicidal ideation. Thought content does not include homicidal or suicidal plan.        Cognition and Memory: Cognition normal.        Judgment: Judgment is impulsive.    Review of Systems  Constitutional: Negative.   HENT: Negative.    Eyes: Negative.   Respiratory: Negative.    Cardiovascular: Negative.   Gastrointestinal: Negative.   Genitourinary: Negative.   Musculoskeletal:  Positive for back pain.  Skin: Negative.     Blood pressure (!) 144/95, pulse (!) 111, temperature 98.7 F (37.1 C), temperature source Oral, resp. rate 18, last menstrual period 02/09/2014, SpO2 98 %. There is no height or weight on file to calculate BMI.  Past Psychiatric History: [facility] June 2023  Is the patient at risk to self? No  Has the patient been a risk to self in the past 6 months? No .    Has the patient been a risk to self within the distant past? No   Is the patient a risk to others? No   Has the patient been a risk to others in the past 6 months? No   Has the patient been a risk to others within the distant past? No   Past Medical History: Hypertension, degenerative disc disease, migraines Family History: Mother-depression, Father-alcohol abuse Social History: 47 y/o single female on disability for degenerative disc disease, lives with partner her [facility] Outpatient Visit on 09/20/2022  Component Date Value Ref Range Status   CYTOLOGY - NON GYN 09/20/2022    Final-Edited                   Value:CYTOLOGY - NON PAP CASE: [id number] PATIENT: Malerie Husain Non-Gynecological Cytology Report     Clinical History: Nodule #1: Left;  Inferior, Maximum size: 3.3 cm; Other 2 dimensions: 1.8 x 1.4 cm, solid/almost completely solid, isoechoic, TI-RADS total points: 3. Specimen Submitted:  A. THYROID, LT INFERIOR, FINE NEEDLE ASPIRATION:   FINAL MICROSCOPIC DIAGNOSIS: - Consistent with benign follicular nodule (Bethesda category II)  SPECIMEN ADEQUACY: Satisfactory for evaluation  GROSS: Received is/are 30 cc's of pale pink cytolyt solution and 6 slides in 95% ethyl alcohol. (EMH:emh) Prepared: Smears:  6 Concentration Method (Thin Prep):  1 Cell Block:  Cell block attempted, not obtained. Additional Studies: Afirma collected.     Final Diagnosis performed  by Tilford Pillar DO.   Electronically signed 09/22/2022 Technical component performed at [employer]. [facility], [place] [address] [place], [place] [address].  Professional component performed at                          [facility], [place] [address], [place], [place] [address].  Immunohistochemistry Technical component (if applicable) was performed at [facility]. [address], [place], [place], [place] [address].   IMMUNOHISTOCHEMISTRY DISCLAIMER (if applicable): Some of these immunohistochemical stains may have been developed and the performance characteristics determine by [facility]. Some may not have been cleared or approved by the U.S. Food and Drug Administration. The FDA has determined that such clearance or approval is not necessary. This test is used for clinical purposes. It should not be regarded as investigational or for research. This laboratory is certified under the [place] (CLIA-88) as qualified to perform high complexity clinical laboratory testing.  The controls stained appropriately.     Allergies: Patient has no known allergies.  Medications:  Facility Ordered Medications  Medication   acetaminophen (TYLENOL) tablet 650 mg    alum & mag hydroxide-simeth (MAALOX/MYLANTA) 200-200-20 MG/5ML suspension 30 mL   magnesium hydroxide (MILK OF MAGNESIA) suspension 30 mL   thiamine (VITAMIN B1) injection 100 mg   [START ON 03/08/2023] thiamine (VITAMIN B1) tablet 100 mg   multivitamin with minerals tablet 1 tablet   LORazepam (ATIVAN) tablet 1 mg   hydrOXYzine (ATARAX) tablet 25 mg   loperamide (IMODIUM) capsule 2-4 mg   ondansetron (ZOFRAN-ODT) disintegrating tablet 4 mg   LORazepam (ATIVAN) tablet 1 mg   Followed by   Derrill Memo ON 03/08/2023] LORazepam (ATIVAN) tablet 1 mg   Followed by   Derrill Memo ON 03/09/2023] LORazepam (ATIVAN) tablet 1 mg   Followed by   Derrill Memo ON 03/11/2023] LORazepam (ATIVAN) tablet 1 mg   traZODone (DESYREL) tablet 100 mg   metoprolol succinate (TOPROL-XL) 24 hr tablet 50 mg   FLUoxetine (PROZAC) capsule 20 mg   PTA Medications  Medication Sig   estradiol (VIVELLE-DOT) 0.0375 MG/24HR Place 1 patch onto the skin 2 (two) times a week. Tuesday and Thursday   metoprolol succinate (TOPROL-XL) 50 MG 24 hr tablet Take 50 mg by mouth at bedtime.   rizatriptan (MAXALT) 5 MG tablet Take 10 mg by mouth See admin instructions. Take 2 tablets (10 mg) by mouth at onset of migraine, may repeat in 2 hours if still needed   esomeprazole (NEXIUM) 20 MG capsule Take 20 mg by mouth daily as needed (acid reflux/indigestion).   FLUoxetine (PROZAC) 20 MG capsule Take 1 capsule (20 mg total) by mouth daily.   haloperidol (HALDOL) 2 MG tablet Take 1 tablet (2 mg total) by mouth 2 (two) times daily.   hydrOXYzine (ATARAX) 25 MG tablet Take 1 tablet (25 mg total) by mouth 3 (three) times daily as needed for anxiety.   mirtazapine (REMERON) 15 MG tablet Take 1 tablet (15 mg total) by mouth at bedtime.   traZODone (DESYREL) 100 MG tablet Take 1 tablet (100 mg total) by mouth at bedtime as needed for sleep.   hydrocortisone cream 1 % Apply topically 2 (two) times daily as needed for itching (skin rash).   benztropine (COGENTIN) 0.5  MG tablet Take 1 tablet (0.5 mg total) by mouth 2 (two) times daily as needed for tremors (or muscle stiffness associated with Haldol use).  methylPREDNISolone (MEDROL DOSEPAK) 4 MG TBPK tablet 6 day dose pack - take as directed   meloxicam (MOBIC) 15 MG tablet Take 1 tablet (15 mg total) by mouth daily.    Long Term Goals: Improvement in symptoms so as ready for discharge  Short Term Goals: Patient will verbalize feelings in meetings with treatment team members., Patient will attend at least of 50% of the groups daily., Pt will complete the PHQ9 on admission, day 3 and discharge., and Patient will take medications as prescribed daily.  Medical Decision Making  Cresenciano Lick. Wurth is a 62 y/o female with a psychiatric history of alcohol abuse and depression presenting voluntarily to [facility] for evaluation and treatment of alcohol use disorder. Patient reports drinking about 1 bottle of wine daily.  Recommendations  Based on my evaluation the patient does not appear to have an emergency medical condition. Patient will be admitted [place] for crisis management, stabilization and safety.   Lucia Bitter, NP 03/07/23  6:10 AM

## 2023-03-08 DIAGNOSIS — F331 Major depressive disorder, recurrent, moderate: Secondary | ICD-10-CM | POA: Diagnosis not present

## 2023-03-08 DIAGNOSIS — F1010 Alcohol abuse, uncomplicated: Secondary | ICD-10-CM | POA: Diagnosis not present

## 2023-03-08 DIAGNOSIS — Z79899 Other long term (current) drug therapy: Secondary | ICD-10-CM | POA: Diagnosis not present

## 2023-03-08 DIAGNOSIS — R451 Restlessness and agitation: Secondary | ICD-10-CM | POA: Diagnosis not present

## 2023-03-08 DIAGNOSIS — F10129 Alcohol abuse with intoxication, unspecified: Secondary | ICD-10-CM | POA: Diagnosis not present

## 2023-03-08 DIAGNOSIS — Z1152 Encounter for screening for COVID-19: Secondary | ICD-10-CM | POA: Diagnosis not present

## 2023-03-08 DIAGNOSIS — R630 Anorexia: Secondary | ICD-10-CM | POA: Diagnosis not present

## 2023-03-08 MED ORDER — SUMATRIPTAN SUCCINATE 50 MG PO TABS
50.0000 mg | ORAL_TABLET | Freq: Once | ORAL | Status: AC
  Administered 2023-03-08: 50 mg via ORAL
  Filled 2023-03-08 (×2): qty 1

## 2023-03-08 NOTE — ED Notes (Signed)
Pt is in the nurses' station stated being "sad because her partner is in a concert where they were both suppose to go but she is stacked here". Respirations are even and unlabored. No acute distress noted. Will continue to monitor for safety.

## 2023-03-08 NOTE — ED Notes (Signed)
Pt sleeping in no acute distress. RR even and unlabored. Environment secured. Will continue to monitor for safety. 

## 2023-03-08 NOTE — ED Provider Notes (Signed)
Behavioral Health Progress Note  Date and Time: 03/08/2023 11:55 AM Name: [name] MRN:  [id number]  Subjective:  [name] is a 62 y/o female with a psychiatric history of alcohol abuse and depression presenting voluntarily to [facility] for evaluation and treatment of worsening depression and anxiety symptoms in context of alcohol use disorder, for which she was admitted to [facility].  On reassessment, patient reiterates that she would like assistance with detox and to continue services at [facility], where she receives therapy weekly. Further discussion reveals that patient has low mood, low energy, dec appetite in the setting of loss of relationship. She does not have a plan as to how housing will look after the breakup. Patient endorses passive SI but is able to contract for safety on the unit. She denies HI, AVH, and paranoia.   We discuss how patient may not be stable enough to detox and return to the same situation without a plan. As well, how it may be more beneficial for her to complete a residential treatment program prior to returning home, and she voices understanding and agreement.  Diagnosis:  Final diagnoses:  Alcohol abuse  MDD (major depressive disorder), recurrent episode, moderate    Total Time spent with patient: 30 minutes  Past Psychiatric History: previously being followed by Dr. Toy Care at [facility] in and was most recently prescribed Prozac 20 mg and trazodone 100 mg.  Patient states that she stopped taking the Prozac a few months ago because she was worried about the interaction between the Prozac and her drinking.  Patient reports she was going to therapy with [facility] but is no longer going to therapy. previous inpatient treatment for alcohol abuse 05/16/22-05/26/22 at [facility].  Past Medical History: Hypertension, degenerative disc disease, migraines Family History: Mother-depression, Father-alcohol abuse Social History: 12 y/o single female on  disability for degenerative disc disease, lived with partner her [place] but currently going through a breakup  Additional Social History:    Pain Medications: See MAR  Prescriptions: See MAR  Over the Counter: See MAR  History of alcohol / drug use?: Yes Longest period of sobriety (when/how long): 1 year Negative Consequences of Use: Personal relationships, Financial Withdrawal Symptoms: Tremors, Sweats Name of Substance 1: alcohol 1 - Age of First Use: 56 1 - Amount (size/oz): 1 bottle of wine 1 - Frequency: daily 1 - Last Use / Amount: today                  Sleep: Poor  Appetite:  Poor  Current Medications:  Current Facility-Administered Medications  Medication Dose Route Frequency Provider Last Rate Last Admin   acetaminophen (TYLENOL) tablet 650 mg  650 mg Oral Q6H PRN Bobbitt, Shalon E, NP   650 mg at 03/07/23 0815   alum & mag hydroxide-simeth (MAALOX/MYLANTA) 200-200-20 MG/5ML suspension 30 mL  30 mL Oral Q4H PRN Bobbitt, Shalon E, NP       FLUoxetine (PROZAC) capsule 20 mg  20 mg Oral Daily Bobbitt, Shalon E, NP   20 mg at 03/08/23 0907   hydrOXYzine (ATARAX) tablet 25 mg  25 mg Oral Q6H PRN Bobbitt, Shalon E, NP   25 mg at 03/07/23 0704   loperamide (IMODIUM) capsule 2-4 mg  2-4 mg Oral PRN Bobbitt, Shalon E, NP   2 mg at 03/08/23 0907   LORazepam (ATIVAN) tablet 1 mg  1 mg Oral Q6H PRN Bobbitt, Shalon E, NP       LORazepam (  ATIVAN) tablet 1 mg  1 mg Oral QID Bobbitt, Shalon E, NP   1 mg at 03/08/23 [id number]   Followed by   LORazepam (ATIVAN) tablet 1 mg  1 mg Oral TID Bobbitt, Shalon E, NP       Followed by   Derrill Memo ON 03/09/2023] LORazepam (ATIVAN) tablet 1 mg  1 mg Oral BID Bobbitt, Shalon E, NP       Followed by   Derrill Memo ON 03/11/2023] LORazepam (ATIVAN) tablet 1 mg  1 mg Oral Daily Bobbitt, Shalon E, NP       magnesium hydroxide (MILK OF MAGNESIA) suspension 30 mL  30 mL Oral Daily PRN Bobbitt, Shalon E, NP       metoprolol succinate (TOPROL-XL) 24 hr tablet  50 mg  50 mg Oral QHS Bobbitt, Shalon E, NP   50 mg at 03/07/23 2125   multivitamin with minerals tablet 1 tablet  1 tablet Oral Daily Bobbitt, Shalon E, NP   1 tablet at 03/08/23 0909   ondansetron (ZOFRAN-ODT) disintegrating tablet 4 mg  4 mg Oral Q6H PRN Bobbitt, Shalon E, NP       thiamine (VITAMIN B1) tablet 100 mg  100 mg Oral Daily Bobbitt, Shalon E, NP   100 mg at 03/08/23 [id number]   traZODone (DESYREL) tablet 100 mg  100 mg Oral QHS PRN Bobbitt, Shalon E, NP   100 mg at 03/07/23 2125   Current Outpatient Medications  Medication Sig Dispense Refill   estradiol (VIVELLE-DOT) 0.0375 MG/24HR Place 1 patch onto the skin 2 (two) times a week.     FLUoxetine (PROZAC) 20 MG capsule Take 1 capsule (20 mg total) by mouth daily. 30 capsule 0   metoprolol succinate (TOPROL-XL) 50 MG 24 hr tablet Take 50 mg by mouth at bedtime.     oxyCODONE-acetaminophen (PERCOCET/ROXICET) 5-325 MG tablet Take 1 tablet by mouth 4 (four) times daily as needed (For pain).     rizatriptan (MAXALT) 5 MG tablet Take 10 mg by mouth See admin instructions. Take 2 tablets (10 mg) by mouth at onset of migraine, may repeat in 2 hours if still needed     traZODone (DESYREL) 100 MG tablet Take 1 tablet (100 mg total) by mouth at bedtime as needed for sleep. (Patient taking differently: Take 100 mg by mouth at bedtime.) 30 tablet 0    Labs  Lab Results:  Admission on 03/07/2023  Component Date Value Ref Range Status   Sodium 03/07/2023 136  135 - 145 mmol/L Final   Potassium 03/07/2023 4.0  3.5 - 5.1 mmol/L Final   Chloride 03/07/2023 96 (L)  98 - 111 mmol/L Final   CO2 03/07/2023 26  22 - 32 mmol/L Final   Glucose, Bld 03/07/2023 107 (H)  70 - 99 mg/dL Final   Glucose reference range applies only to samples taken after fasting for at least 8 hours.   BUN 03/07/2023 13  8 - 23 mg/dL Final   Creatinine, Ser 03/07/2023 0.63  0.44 - 1.00 mg/dL Final   Calcium 03/07/2023 9.5  8.9 - 10.3 mg/dL Final   Total Protein 03/07/2023  7.0  6.5 - 8.1 g/dL Final   Albumin 03/07/2023 4.4  3.5 - 5.0 g/dL Final   AST 03/07/2023 102 (H)  15 - 41 U/L Final   ALT 03/07/2023 69 (H)  0 - 44 U/L Final   Alkaline Phosphatase 03/07/2023 66  38 - 126 U/L Final   Total Bilirubin 03/07/2023 0.8  0.3 - 1.2 mg/dL  Final   GFR, Estimated 03/07/2023 >60  >60 mL/min Final   Comment: (NOTE) Calculated using the CKD-EPI Creatinine Equation (2021)    Anion gap 03/07/2023 14  5 - 15 Final   Performed at [facility], [place] [address], [place], [place] [address]   WBC 03/07/2023 3.8 (L)  4.0 - 10.5 K/uL Final   RBC 03/07/2023 4.42  3.87 - 5.11 MIL/uL Final   Hemoglobin 03/07/2023 14.9  12.0 - 15.0 g/dL Final   HCT 03/07/2023 43.6  36.0 - 46.0 % Final   MCV 03/07/2023 98.6  80.0 - 100.0 fL Final   MCH 03/07/2023 33.7  26.0 - 34.0 pg Final   MCHC 03/07/2023 34.2  30.0 - 36.0 g/dL Final   RDW 03/07/2023 13.5  11.5 - 15.5 % Final   Platelets 03/07/2023 102 (L)  150 - 400 K/uL Final   Comment: Immature Platelet Fraction may be clinically indicated, consider ordering this additional test [id number] REPEATED TO VERIFY    nRBC 03/07/2023 0.0  0.0 - 0.2 % Final   Neutrophils Relative % 03/07/2023 39  % Final   Neutro Abs 03/07/2023 1.5 (L)  1.7 - 7.7 K/uL Final   Lymphocytes Relative 03/07/2023 48  % Final   Lymphs Abs 03/07/2023 1.9  0.7 - 4.0 K/uL Final   Monocytes Relative 03/07/2023 10  % Final   Monocytes Absolute 03/07/2023 0.4  0.1 - 1.0 K/uL Final   Eosinophils Relative 03/07/2023 1  % Final   Eosinophils Absolute 03/07/2023 0.0  0.0 - 0.5 K/uL Final   Basophils Relative 03/07/2023 1  % Final   Basophils Absolute 03/07/2023 0.0  0.0 - 0.1 K/uL Final   Immature Granulocytes 03/07/2023 1  % Final   Abs Immature Granulocytes 03/07/2023 0.02  0.00 - 0.07 K/uL Final   Performed at [facility], [place] [address], [place], [place] [address]   Alcohol, Ethyl (B) 03/07/2023 211 (H)  <10 mg/dL Final   Comment: (NOTE) Lowest detectable  limit for serum alcohol is 10 mg/dL.  For medical purposes only. Performed at [facility], [place] [address] [place], [place] [address]    Cholesterol 03/07/2023 368 (H)  0 - 200 mg/dL Final   Triglycerides 03/07/2023 695 (H)  <150 mg/dL Final   HDL 03/07/2023 110  >40 mg/dL Final   Total CHOL/HDL Ratio 03/07/2023 3.3  RATIO Final   VLDL 03/07/2023 UNABLE TO CALCULATE IF TRIGLYCERIDE OVER 400 mg/dL  0 - 40 mg/dL Final   LDL Cholesterol 03/07/2023 UNABLE TO CALCULATE IF TRIGLYCERIDE OVER 400 mg/dL  0 - 99 mg/dL Final   Comment:        Total Cholesterol/HDL:CHD Risk Coronary Heart Disease Risk Table                     Men   Women  1/2 Average Risk   3.4   3.3  Average Risk       5.0   4.4  2 X Average Risk   9.6   7.1  3 X Average Risk  23.4   11.0        Use the calculated Patient Ratio above and the CHD Risk Table to determine the patient's CHD Risk.        ATP III CLASSIFICATION (LDL):  <100     mg/dL   Optimal  100-129  mg/dL   Near or Above  Optimal  130-159  mg/dL   Borderline  160-189  mg/dL   High  >190     mg/dL   Very High Performed at [facility] [address], [place], [place] [address]    Hgb A1c MFr Bld 03/07/2023 5.6  4.8 - 5.6 % Final   Comment: (NOTE)         Prediabetes: 5.7 - 6.4         Diabetes: >6.4         Glycemic control for adults with diabetes: <7.0    Mean Plasma Glucose 03/07/2023 114  mg/dL Final   Comment: (NOTE) Performed At: [facility] [place], [place] [id number] Rush Farmer MD [id number]    Anna 2 AG 03/07/2023 NEGATIVE  NEGATIVE Final   Comment: (NOTE) SARS-CoV-2 antigen NOT DETECTED.   Negative results are presumptive.  Negative results do not preclude SARS-CoV-2 infection and should not be used as the sole basis for treatment or other patient management decisions, including infection  control decisions, particularly in the presence of clinical signs and   symptoms consistent with COVID-19, or in those who have been in contact with the virus.  Negative results must be combined with clinical observations, patient history, and epidemiological information. The expected result is Negative.  Fact Sheet for Patients: HandmadeRecipes.com.cy  Fact Sheet for Healthcare Providers: FuneralLife.at  This test is not yet approved or cleared by the [place] FDA and  has been authorized for detection and/or diagnosis of SARS-CoV-2 by FDA under an Emergency Use Authorization (EUA).  This EUA will remain in effect (meaning this test can be used) for the duration of  the COV                          ID-19 declaration under Section 564(b)(1) of the Act, 21 U.S.C. section 360bbb-3(b)(1), unless the authorization is terminated or revoked sooner.     SARS Coronavirus 2 by RT PCR 03/07/2023 NEGATIVE  NEGATIVE Final   Performed at [facility], [place] [address] [place], [place] [address]   Direct LDL 03/07/2023 172 (H)  0 - 99 mg/dL Final   Performed at [facility] [address] [place], [place] [address]  Hospital Outpatient Visit on 09/20/2022  Component Date Value Ref Range Status   CYTOLOGY - NON GYN 09/20/2022    Final-Edited                   Value:CYTOLOGY - NON PAP CASE: [id number] PATIENT: Willard Plato Non-Gynecological Cytology Report     Clinical History: Nodule #1: Left; Inferior, Maximum size: 3.3 cm; Other 2 dimensions: 1.8 x 1.4 cm, solid/almost completely solid, isoechoic, TI-RADS total points: 3. Specimen Submitted:  A. THYROID, LT INFERIOR, FINE NEEDLE ASPIRATION:   FINAL MICROSCOPIC DIAGNOSIS: - Consistent with benign follicular nodule (Bethesda category II)  SPECIMEN ADEQUACY: Satisfactory for evaluation  GROSS: Received is/are 30 cc's of pale pink cytolyt solution and 6 slides in 95% ethyl alcohol. (EMH:emh) Prepared: Smears:  6 Concentration Method  (Thin Prep):  1 Cell Block:  Cell block attempted, not obtained. Additional Studies: Afirma collected.     Final Diagnosis performed by Tilford Pillar DO.   Electronically signed 09/22/2022 Technical component performed at [employer], [place] [address], [place], [place] [address].  Professional component performed at  [facility], [place] [address], [place], [place] [address].  Immunohistochemistry Technical component (if applicable) was performed at [facility]. [address] [place], [place], [place] [address].   IMMUNOHISTOCHEMISTRY DISCLAIMER (if applicable): Some of these immunohistochemical stains may have been developed and the performance characteristics determine by [facility]. Some may not have been cleared or approved by the U.S. Food and Drug Administration. The FDA has determined that such clearance or approval is not necessary. This test is used for clinical purposes. It should not be regarded as investigational or for research. This laboratory is certified under the [place] (CLIA-88) as qualified to perform high complexity clinical laboratory testing.  The controls stained appropriately.     Blood Alcohol level:  Lab Results  Component Value Date   ETH 211 (H) 03/07/2023   ETH 309 (HH) [id number]    Metabolic Disorder Labs: Lab Results  Component Value Date   HGBA1C 5.6 03/07/2023   MPG 114 03/07/2023   MPG 105.41 05/25/2022   No results found for: "PROLACTIN" Lab Results  Component Value Date   CHOL 368 (H) 03/07/2023   TRIG 695 (H) 03/07/2023   HDL 110 03/07/2023   CHOLHDL 3.3 03/07/2023   VLDL UNABLE TO CALCULATE IF TRIGLYCERIDE OVER 400 mg/dL 03/07/2023   LDLCALC UNABLE TO CALCULATE IF TRIGLYCERIDE OVER 400 mg/dL 03/07/2023   LDLCALC 99 05/15/2022    Therapeutic Lab Levels: No results found for: "LITHIUM" No  results found for: "VALPROATE" No results found for: "CBMZ"  Physical Findings   AIMS    Flowsheet Row ED to Hosp-Admission (Discharged) from 05/16/2022 in [facility]  AIMS Total Score 0      AUDIT    Flowsheet Row ED to Hosp-Admission (Discharged) from 05/16/2022 in [facility]  Alcohol Use Disorder Identification Test Final Score (AUDIT) 14      PHQ2-9    Flowsheet Row ED from 03/07/2023 in [facility] ED from 05/15/2022 in [facility]  PHQ-2 Total Score 2 4  PHQ-9 Total Score 6 18      [facility] ED from 03/07/2023 in [facility] ED to Hosp-Admission (Discharged) from 05/16/2022 in [facility] ED from 05/15/2022 in [facility] No Risk No Risk No Risk        Musculoskeletal  Strength & Muscle Tone: within normal limits Gait & Station: normal Patient leans: N/A  Psychiatric Specialty Exam  Presentation  General Appearance:  Casual  Eye Contact: Good  Speech: Clear and Coherent  Speech Volume: Normal  Handedness: Right   Mood and Affect  Mood: Anxious; Depressed; Hopeless  Affect: Tearful   Thought Process  Thought Processes: Coherent  Descriptions of Associations:Intact  Orientation:Full (Time, Place and Person)  Thought Content:Logical  Diagnosis of Schizophrenia or Schizoaffective disorder in past: No    Hallucinations:Hallucinations: None  Ideas of Reference:None  Suicidal Thoughts:Suicidal Thoughts: No  Homicidal Thoughts:Homicidal Thoughts: No   Sensorium  Memory: Immediate Fair; Recent Fair; Remote Fair  Judgment: Fair  Insight: Fair   Community education officer  Concentration: Fair  Attention Span: Fair  Recall: Good  Fund of Knowledge: Good  Language: Good   Psychomotor  Activity  Psychomotor Activity: Psychomotor Activity: Normal   Assets  Assets: Communication Skills; Physical Health; Social Support   Sleep  Sleep: Sleep: [place] Number  of Hours of Sleep: -1   Nutritional Assessment (For OBS and FBC admissions only) Has the patient had a decrease in food intake/or appetite?: No Does the patient have dental problems?: No Does the patient have eating habits or behaviors that may be indicators of an eating disorder including binging or inducing vomiting?: No Has the patient recently lost weight without trying?: 0 Has the patient been eating poorly because of a decreased appetite?: 1 Malnutrition Screening Tool Score: 1    Physical Exam  Physical Exam ROS Blood pressure (!) 140/90, pulse 76, temperature 98.8 F (37.1 C), temperature source Oral, resp. rate 17, last menstrual period 02/09/2014, SpO2 97 %. There is no height or weight on file to calculate BMI.  Treatment Plan Summary: Daily contact with patient to assess and evaluate symptoms and progress in treatment and Medication management Skylinn Bath is a 62 year old female presenting for detox from alcohol use. She voices agreement with residential substance use treatment today but will continue to assess tomorrow.   AUD -Continue CIWA protocol for monitoring of withdrawal with po thiamine and MVI replacement, Ativan taper, and Ativan 1mg  for scores >10.  MDD -Continue Prozac 20 mg daily  HTN -Continue home metoprolol XL 50 mg qHS  Continue PRN's: Tylenol, Maalox, Atarax, Milk of Magnesia, Trazodone, Loperamide, and Zofran   Dispo: Pending  Rosezetta Schlatter, MD 03/08/2023 11:55 AM

## 2023-03-08 NOTE — ED Notes (Signed)
Pt approached nurses station tearful stating, "my partner took someone else to a concert tonight. I bought the tickets". Support provided and encouragement to focus on treatment in facility. Recommended pt to self reflect and recognize her own worth. Recommended pt to journal her feelings on paper. Pt agreed. Pt proceeded to her room with journal in her hand. Denies SI/HI/AVH. Will continue to monitor for safety.

## 2023-03-08 NOTE — ED Notes (Signed)
Patient A&Ox4. Denies intent to harm self/others when asked. Denies A/VH. Patient states, :I've been having diarrhea. They gave me some Immodium last night. It helped some but my stomach feel funny. I only ate a little bkft because I didn't want the diarrhea to start back. Other than that, I feel ok". Pt denies SI/HI/AVH.  Routine safety checks conducted according to facility protocol. Encouraged patient to notify staff if thoughts of harm toward self or others arise. Patient verbalize understanding and agreement. Will continue to monitor for safety.

## 2023-03-08 NOTE — ED Notes (Signed)
Patient c/o Diarrhea, asking for medication

## 2023-03-08 NOTE — Group Note (Unsigned)
Group Topic: Fears and Unhealthy Coping Skills  Group Date: 03/08/2023 Start Time: 1100 End Time: 1130 Facilitators: Georgiann Mohs, RN  Department: [facility]  Number of Participants: 8  Group Focus: coping skills Treatment Modality:  Patient-Centered Therapy Interventions utilized were group exercise Purpose: express irrational fears   Name: SHAWNIE LOBELL Date of Birth: 10/15/1961  MR: [id number]    Level of Participation: {THERAPIES; PSYCH GROUP PARTICIPATION [id number] Quality of Participation: {THERAPIES; PSYCH QUALITY OF PARTICIPATION:23992} Interactions with others: {THERAPIES; PSYCH INTERACTIONS:23993} Mood/Affect: {THERAPIES; PSYCH MOOD/AFFECT:23994} Triggers (if applicable): *** Cognition: {THERAPIES; PSYCH COGNITION:23995} Progress: {THERAPIES; PSYCH PROGRESS:23997} Response: *** Plan: {THERAPIES; PSYCH [id number]  Patients Problems:  Patient Active Problem List   Diagnosis Date Noted   Alcohol use disorder 03/07/2023   Thrombocytopenia 11/11/2022   Urinary tract infection without hematuria 11/11/2022   Thyroid nodule 08/11/2022   Pelvic and perineal pain 05/22/2022   Abdominal bloating 05/22/2022   Elevated levels of transaminase & lactic acid dehydrogenase 05/22/2022   Colon cancer screening 05/22/2022   Substance induced mood disorder 05/16/2022   MDD (major depressive disorder) 05/15/2022   Alcohol dependence    Substance-induced disorder    Degeneration of lumbar intervertebral disc 05/10/2018   On long term drug therapy 12/07/2017   Chronic low back pain 12/07/2017   Post traumatic stress disorder (PTSD) 09/12/2014   Family history of alcoholism in father 09/12/2014   Alcohol dependence with uncomplicated withdrawal [id number]   Dysmenorrhea 03/19/2014   GERD 11/03/2008   MIGRAINE HEADACHE 12/20/2007   HYPERTENSION 12/20/2007   SLEEP DISORDER 12/20/2007   HEADACHE 12/20/2007

## 2023-03-09 DIAGNOSIS — F1010 Alcohol abuse, uncomplicated: Secondary | ICD-10-CM | POA: Diagnosis not present

## 2023-03-09 DIAGNOSIS — Z1152 Encounter for screening for COVID-19: Secondary | ICD-10-CM | POA: Diagnosis not present

## 2023-03-09 DIAGNOSIS — Z79899 Other long term (current) drug therapy: Secondary | ICD-10-CM | POA: Diagnosis not present

## 2023-03-09 DIAGNOSIS — F331 Major depressive disorder, recurrent, moderate: Secondary | ICD-10-CM | POA: Diagnosis not present

## 2023-03-09 DIAGNOSIS — R451 Restlessness and agitation: Secondary | ICD-10-CM | POA: Diagnosis not present

## 2023-03-09 DIAGNOSIS — R630 Anorexia: Secondary | ICD-10-CM | POA: Diagnosis not present

## 2023-03-09 DIAGNOSIS — F10129 Alcohol abuse with intoxication, unspecified: Secondary | ICD-10-CM | POA: Diagnosis not present

## 2023-03-09 LAB — POCT URINE DRUG SCREEN - MANUAL ENTRY (I-SCREEN)
POC Amphetamine UR: NOT DETECTED
POC Buprenorphine (BUP): NOT DETECTED
POC Cocaine UR: NOT DETECTED
POC Marijuana UR: NOT DETECTED
POC Methadone UR: NOT DETECTED
POC Methamphetamine UR: NOT DETECTED
POC Morphine: NOT DETECTED
POC Oxazepam (BZO): POSITIVE — AB
POC Oxycodone UR: POSITIVE — AB
POC Secobarbital (BAR): NOT DETECTED

## 2023-03-09 NOTE — ED Notes (Signed)
Pt sleeping in no acute distress. RR even and unlabored. Environment secured. Will continue to monitor for safety. 

## 2023-03-09 NOTE — ED Notes (Signed)
Patient A&O x 4, ambulatory. Patient discharged in no acute distress. Patient denied SI/HI, A/VH upon discharge. Patient verbalized understanding of all discharge instructions explained by staff, to include follow up appointments and safety plan. Patient reported mood 10/10.  Pt belongings returned to patient from locker #23 intact. Patient escorted to lobby via staff for self transport to home. Safety maintained.

## 2023-03-09 NOTE — ED Notes (Addendum)
Patient is sleeping. Respirations equal and unlabored, skin warm and dry. No change in assessment or acuity. Routine safety checks conducted according to facility protocol. Will continue to monitor for safety.

## 2023-03-09 NOTE — ED Notes (Signed)
Patient A&Ox4. Denies intent to harm self/others when asked. Denies A/VH. Patient denies any physical complaints when asked. Pt request to discharge to home today. Pt states, "I don't want to go to another facility. I need to be home. I'm feeling better. The only thing that was bothering me was my stomach but I can tolerate that at home". Support and encouragement provided. MD notified of pt request to discharge today via secure chat. Routine safety checks conducted according to facility protocol. Encouraged patient to notify staff if thoughts of harm toward self or others arise. Patient verbalize understanding and agreement. Will continue to monitor for safety.

## 2023-03-09 NOTE — ED Notes (Signed)
Patient is sleeping. Respirations equal and unlabored, skin warm and dry, NAD. No change in assessment or acuity. Routine safety checks conducted according to facility protocol. Will continue to monitor for safety.   

## 2023-03-09 NOTE — ED Provider Notes (Signed)
FBC/OBS ASAP Discharge Summary  Date and Time: 03/09/2023 10:34 AM  Name: [name]  MRN:  [id number]   Discharge Diagnoses:  Final diagnoses:  Alcohol abuse  MDD (major depressive disorder), recurrent episode, moderate    Subjective: [name] is a 62 y/o female with a psychiatric history of alcohol abuse and depression presenting voluntarily to [facility] for evaluation and treatment of worsening depression and anxiety symptoms in context of alcohol use disorder, for which she was admitted to [facility] on 4/2.  Stay Summary: Patient was admitted, initiated on detox protocol, and continued on home medications. She attended some group therapy sessions.   On day of discharge, patient was advised to continue detox and to go to residential treatment, as she has not worked through her triggers. Patient refused, stating that she would like to get home to get showered due to having diarrhea. Patient denied eating anything out of the ordinary (has not been eating much at all), recent sick contacts, and has stable vitals. She does report some chills but noted this at home PTA as well. Of note, she is on an estrogen patch. She was advised that the diarrhea was likely 2/2 alcohol w/d and not a bug. Despite patient's unwillingness to continue detox, she was scheduled with an appointment at the [facility] with her previous therapist, Annamary Carolin. Safety planning was completed with patient, as she refused to identify a person with whom safety planning could be completed. Patient denied SI, HI, and AVH.   Total Time spent with patient: 30 minutes  Past Psychiatric History: previously being followed by Dr. Toy Care at [facility] in and was most recently prescribed Prozac 20 mg and trazodone 100 mg.  Patient states that she stopped taking the Prozac a few months ago because she was worried about the interaction between the Prozac and her drinking.  Patient reports she was going to therapy with [facility] Breeding  but is no longer going to therapy. previous inpatient treatment for alcohol abuse 05/16/22-05/26/22 at [facility].  Past Medical History: Hypertension, degenerative disc disease, migraines Family History: Mother-depression, Father-alcohol abuse Social History: 32 y/o single female on disability for degenerative disc disease, lived with partner her [place] but currently going through a breakup Tobacco Cessation:  N/A, patient does not currently use tobacco products  Current Medications:  Current Facility-Administered Medications  Medication Dose Route Frequency Provider Last Rate Last Admin   acetaminophen (TYLENOL) tablet 650 mg  650 mg Oral Q6H PRN Bobbitt, Shalon E, NP   650 mg at 03/09/23 0341   alum & mag hydroxide-simeth (MAALOX/MYLANTA) 200-200-20 MG/5ML suspension 30 mL  30 mL Oral Q4H PRN Bobbitt, Shalon E, NP   30 mL at 03/09/23 0452   FLUoxetine (PROZAC) capsule 20 mg  20 mg Oral Daily Bobbitt, Shalon E, NP   20 mg at 03/09/23 0907   hydrOXYzine (ATARAX) tablet 25 mg  25 mg Oral Q6H PRN Bobbitt, Shalon E, NP   25 mg at 03/08/23 1941   loperamide (IMODIUM) capsule 2-4 mg  2-4 mg Oral PRN Bobbitt, Shalon E, NP   2 mg at 03/09/23 0658   LORazepam (ATIVAN) tablet 1 mg  1 mg Oral Q6H PRN Bobbitt, Shalon E, NP   1 mg at 03/08/23 2108   LORazepam (ATIVAN) tablet 1 mg  1 mg Oral TID Bobbitt, Shalon E, NP   1 mg at 03/09/23 0907   Followed by   LORazepam (ATIVAN) tablet 1 mg  1 mg Oral  BID Bobbitt, Shalon E, NP       Followed by   Derrill Memo ON 03/11/2023] LORazepam (ATIVAN) tablet 1 mg  1 mg Oral Daily Bobbitt, Shalon E, NP       magnesium hydroxide (MILK OF MAGNESIA) suspension 30 mL  30 mL Oral Daily PRN Bobbitt, Shalon E, NP       metoprolol succinate (TOPROL-XL) 24 hr tablet 50 mg  50 mg Oral QHS Bobbitt, Shalon E, NP   50 mg at 03/08/23 2109   multivitamin with minerals tablet 1 tablet  1 tablet Oral Daily Bobbitt, Shalon E, NP   1 tablet at 03/09/23 0907   ondansetron (ZOFRAN-ODT)  disintegrating tablet 4 mg  4 mg Oral Q6H PRN Bobbitt, Shalon E, NP       thiamine (VITAMIN B1) tablet 100 mg  100 mg Oral Daily Bobbitt, Shalon E, NP   100 mg at 03/09/23 [id number]   traZODone (DESYREL) tablet 100 mg  100 mg Oral QHS PRN Bobbitt, Shalon E, NP   100 mg at 03/08/23 2109   Current Outpatient Medications  Medication Sig Dispense Refill   estradiol (VIVELLE-DOT) 0.0375 MG/24HR Place 1 patch onto the skin 2 (two) times a week.     FLUoxetine (PROZAC) 20 MG capsule Take 1 capsule (20 mg total) by mouth daily. 30 capsule 0   metoprolol succinate (TOPROL-XL) 50 MG 24 hr tablet Take 50 mg by mouth at bedtime.     rizatriptan (MAXALT) 5 MG tablet Take 10 mg by mouth See admin instructions. Take 2 tablets (10 mg) by mouth at onset of migraine, may repeat in 2 hours if still needed     traZODone (DESYREL) 100 MG tablet Take 1 tablet (100 mg total) by mouth at bedtime as needed for sleep. (Patient taking differently: Take 100 mg by mouth at bedtime.) 30 tablet 0    PTA Medications:  Facility Ordered Medications  Medication   acetaminophen (TYLENOL) tablet 650 mg   alum & mag hydroxide-simeth (MAALOX/MYLANTA) 200-200-20 MG/5ML suspension 30 mL   magnesium hydroxide (MILK OF MAGNESIA) suspension 30 mL   [COMPLETED] thiamine (VITAMIN B1) injection 100 mg   thiamine (VITAMIN B1) tablet 100 mg   multivitamin with minerals tablet 1 tablet   LORazepam (ATIVAN) tablet 1 mg   hydrOXYzine (ATARAX) tablet 25 mg   loperamide (IMODIUM) capsule 2-4 mg   ondansetron (ZOFRAN-ODT) disintegrating tablet 4 mg   [COMPLETED] LORazepam (ATIVAN) tablet 1 mg   Followed by   LORazepam (ATIVAN) tablet 1 mg   Followed by   LORazepam (ATIVAN) tablet 1 mg   Followed by   Derrill Memo ON 03/11/2023] LORazepam (ATIVAN) tablet 1 mg   traZODone (DESYREL) tablet 100 mg   metoprolol succinate (TOPROL-XL) 24 hr tablet 50 mg   FLUoxetine (PROZAC) capsule 20 mg   [COMPLETED] SUMAtriptan (IMITREX) tablet 50 mg   PTA  Medications  Medication Sig   metoprolol succinate (TOPROL-XL) 50 MG 24 hr tablet Take 50 mg by mouth at bedtime.   rizatriptan (MAXALT) 5 MG tablet Take 10 mg by mouth See admin instructions. Take 2 tablets (10 mg) by mouth at onset of migraine, may repeat in 2 hours if still needed   FLUoxetine (PROZAC) 20 MG capsule Take 1 capsule (20 mg total) by mouth daily.   traZODone (DESYREL) 100 MG tablet Take 1 tablet (100 mg total) by mouth at bedtime as needed for sleep. (Patient taking differently: Take 100 mg by mouth at bedtime.)  03/09/2023   10:19 AM 03/07/2023    5:33 AM 05/15/2022    1:39 PM  Depression screen PHQ 2/9  Decreased Interest 1 1 2   Down, Depressed, Hopeless 1 1 2   PHQ - 2 Score 2 2 4   Altered sleeping 1 1 3   Tired, decreased energy 1 1 2   Change in appetite 1 0 0  Feeling bad or failure about yourself  1 1 2   Trouble concentrating 1 1 2   Moving slowly or fidgety/restless 0 0 2  Suicidal thoughts 0 0 3  PHQ-9 Score 7 6 18   Difficult doing work/chores  Somewhat difficult Somewhat difficult    Flowsheet Row ED from 03/07/2023 in [facility] ED to Hosp-Admission (Discharged) from 05/16/2022 in [place] [facility] ED from 05/15/2022 in [place] No Risk No Risk No Risk       Musculoskeletal  Strength & Muscle Tone: within normal limits Gait & Station: normal Patient leans: N/A  Psychiatric Specialty Exam  Presentation  General Appearance:  Appropriate for Environment; Casual  Eye Contact: Good  Speech: Clear and Coherent; Normal Rate  Speech Volume: Normal  Handedness: Right   Mood and Affect  Mood: Anxious; Depressed  Affect: Congruent; Depressed   Thought Process  Thought Processes: Coherent; Linear  Descriptions of Associations:Intact  Orientation:Full (Time, Place and Person)  Thought Content:Logical; WDL  Diagnosis of  Schizophrenia or Schizoaffective disorder in past: No    Hallucinations:Hallucinations: None  Ideas of Reference:None  Suicidal Thoughts:Suicidal Thoughts: No  Homicidal Thoughts:Homicidal Thoughts: No   Sensorium  Memory: Immediate Good; Recent Good  Judgment: Poor  Insight: Fair   Community education officer  Concentration: Fair  Attention Span: Fair  Recall: [place] of Knowledge: Good  Language: Good   Psychomotor Activity  Psychomotor Activity:Psychomotor Activity: Normal   Assets  Assets: Communication Skills; Desire for Improvement; Housing; Social Support   Sleep  Sleep:Sleep: Fair   No data recorded  Physical Exam  Physical Exam Vitals reviewed.  Constitutional:      General: She is not in acute distress. HENT:     Head: Normocephalic and atraumatic.  Pulmonary:     Effort: Pulmonary effort is normal.  Neurological:     Mental Status: She is alert and oriented to person, place, and time.   Review of Systems  Constitutional:  Positive for chills and diaphoresis.  Gastrointestinal:  Positive for abdominal pain and diarrhea. Negative for nausea and vomiting.  Neurological:  Negative for headaches.   Blood pressure (!) 146/97, pulse 80, temperature 98.4 F (36.9 C), temperature source Oral, resp. rate 18, last menstrual period 02/09/2014, SpO2 99 %. There is no height or weight on file to calculate BMI.  Demographic Factors:  Divorced or widowed, Caucasian, and Low socioeconomic status  Loss Factors: Decrease in vocational status, Loss of significant relationship, Decline in physical health, and Financial problems/change in socioeconomic status  Historical Factors: Impulsivity  Risk Reduction Factors:   Positive therapeutic relationship  Continued Clinical Symptoms:  Depression:   Comorbid alcohol abuse/dependence Alcohol/Substance Abuse/Dependencies Unstable or Poor Therapeutic Relationship  Cognitive Features That Contribute  To Risk:  Thought constriction (tunnel vision)    Suicide Risk:  Mild: There are no identifiable plans, no associated intent, mild dysphoria and related symptoms, good self-control (both objective and subjective assessment), few other risk factors, and identifiable protective factors, including available and accessible social support.  Plan Of Care/Follow-up recommendations:  Follow-up  recommendations:  Activity:  Normal, as tolerated Diet:  Per PCP recommendation  Patient is instructed prior to discharge to: Take all medications as prescribed by her mental healthcare provider. Report any adverse effects and/or reactions from the medicines to her outpatient provider promptly. Patient has been instructed & cautioned: To not engage in alcohol and or illegal drug use while on prescription medicines.  In the event of worsening symptoms, patient is instructed to call the crisis hotline at 988, 911 and or go to the nearest ED for appropriate evaluation and treatment of symptoms. To follow-up with her primary care provider for your other medical issues, concerns and or health care needs.   Disposition: Home, with outpatient follow-up at [facility] 4/9  Rosezetta Schlatter, MD 03/09/2023, 10:34 AM

## 2023-03-14 ENCOUNTER — Ambulatory Visit: Payer: Medicare HMO | Admitting: Podiatry

## 2023-03-15 ENCOUNTER — Encounter (HOSPITAL_COMMUNITY): Payer: Self-pay

## 2023-03-15 ENCOUNTER — Emergency Department (HOSPITAL_COMMUNITY)
Admission: EM | Admit: 2023-03-15 | Discharge: 2023-03-15 | Disposition: A | Discharge status: Home / Self Care | Payer: Medicare HMO | Attending: Emergency Medicine | Admitting: Emergency Medicine

## 2023-03-15 ENCOUNTER — Other Ambulatory Visit: Payer: Self-pay

## 2023-03-15 DIAGNOSIS — Y908 Blood alcohol level of 240 mg/100 ml or more: Secondary | ICD-10-CM | POA: Insufficient documentation

## 2023-03-15 DIAGNOSIS — F1020 Alcohol dependence, uncomplicated: Secondary | ICD-10-CM | POA: Diagnosis not present

## 2023-03-15 DIAGNOSIS — F1010 Alcohol abuse, uncomplicated: Secondary | ICD-10-CM | POA: Insufficient documentation

## 2023-03-15 DIAGNOSIS — I10 Essential (primary) hypertension: Secondary | ICD-10-CM | POA: Insufficient documentation

## 2023-03-15 DIAGNOSIS — Z6825 Body mass index (BMI) 25.0-25.9, adult: Secondary | ICD-10-CM | POA: Diagnosis not present

## 2023-03-15 DIAGNOSIS — R000 Tachycardia, unspecified: Secondary | ICD-10-CM | POA: Diagnosis not present

## 2023-03-15 DIAGNOSIS — F10929 Alcohol use, unspecified with intoxication, unspecified: Secondary | ICD-10-CM | POA: Diagnosis not present

## 2023-03-15 DIAGNOSIS — R079 Chest pain, unspecified: Secondary | ICD-10-CM | POA: Diagnosis not present

## 2023-03-15 DIAGNOSIS — E663 Overweight: Secondary | ICD-10-CM | POA: Diagnosis not present

## 2023-03-15 LAB — CBC WITH DIFFERENTIAL/PLATELET
Abs Immature Granulocytes: 0.01 10*3/uL (ref 0.00–0.07)
Basophils Absolute: 0 10*3/uL (ref 0.0–0.1)
Basophils Relative: 1 %
Eosinophils Absolute: 0 10*3/uL (ref 0.0–0.5)
Eosinophils Relative: 0 %
HCT: 45.6 % (ref 36.0–46.0)
Hemoglobin: 16.3 g/dL — ABNORMAL HIGH (ref 12.0–15.0)
Immature Granulocytes: 0 %
Lymphocytes Relative: 43 %
Lymphs Abs: 2.6 10*3/uL (ref 0.7–4.0)
MCH: 34.5 pg — ABNORMAL HIGH (ref 26.0–34.0)
MCHC: 35.7 g/dL (ref 30.0–36.0)
MCV: 96.6 fL (ref 80.0–100.0)
Monocytes Absolute: 0.5 10*3/uL (ref 0.1–1.0)
Monocytes Relative: 8 %
Neutro Abs: 2.9 10*3/uL (ref 1.7–7.7)
Neutrophils Relative %: 48 %
Platelets: 169 10*3/uL (ref 150–400)
RBC: 4.72 MIL/uL (ref 3.87–5.11)
RDW: 14.3 % (ref 11.5–15.5)
WBC: 6.1 10*3/uL (ref 4.0–10.5)
nRBC: 0 % (ref 0.0–0.2)

## 2023-03-15 LAB — COMPREHENSIVE METABOLIC PANEL
ALT: 63 U/L — ABNORMAL HIGH (ref 0–44)
AST: 108 U/L — ABNORMAL HIGH (ref 15–41)
Albumin: 4.5 g/dL (ref 3.5–5.0)
Alkaline Phosphatase: 72 U/L (ref 38–126)
Anion gap: 16 — ABNORMAL HIGH (ref 5–15)
BUN: 10 mg/dL (ref 8–23)
CO2: 23 mmol/L (ref 22–32)
Calcium: 9.3 mg/dL (ref 8.9–10.3)
Chloride: 104 mmol/L (ref 98–111)
Creatinine, Ser: 0.61 mg/dL (ref 0.44–1.00)
GFR, Estimated: >60 mL/min (ref >60)
Glucose, Bld: 129 mg/dL — ABNORMAL HIGH (ref 70–99)
Potassium: 4.1 mmol/L (ref 3.5–5.1)
Sodium: 143 mmol/L (ref 135–145)
Total Bilirubin: 0.9 mg/dL (ref 0.3–1.2)
Total Protein: 7.6 g/dL (ref 6.5–8.1)

## 2023-03-15 LAB — ETHANOL: Alcohol, Ethyl (B): 337 mg/dL (ref <10)

## 2023-03-15 MED ORDER — METOPROLOL SUCCINATE ER 25 MG PO TB24: 50.0000 mg | ORAL_TABLET | Freq: Every day | ORAL | Status: DC

## 2023-03-15 MED ORDER — TRAZODONE HCL 50 MG PO TABS: 100.0000 mg | ORAL_TABLET | Freq: Every evening | ORAL | Status: DC | PRN
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
## 2023-03-15 NOTE — Discharge Instructions (Addendum)
You Need to go to detox to safely stop drinking alcohol if you are going to stop. The [facility] is on your discharge.  Follow-up with your counselor as well.  Come back to the ER for any new or worsening symptoms.
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
## 2023-03-15 NOTE — ED Provider Notes (Incomplete)
EMERGENCY DEPARTMENT AT [facility] Provider Note   CSN: [id number] Arrival date & time: 03/15/23  2054     History {Add pertinent medical, surgical, social history, OB history to HPI:1} Chief Complaint  Patient presents with  . Alcohol Intoxication    [name] is a 62 y.o. female. She reports history of alcohol    Alcohol Intoxication       Home Medications Prior to Admission medications   Medication Sig Start Date End Date Taking? Authorizing Provider  estradiol (VIVELLE-DOT) 0.0375 MG/24HR Place 1 patch onto the skin 2 (two) times a week.    [provider]  FLUoxetine (PROZAC) 20 MG capsule Take 1 capsule (20 mg total) by mouth daily. 05/26/22   Comer Locket, MD  metoprolol succinate (TOPROL-XL) 50 MG 24 hr tablet Take 50 mg by mouth at bedtime. 09/16/21   [provider]  rizatriptan (MAXALT) 5 MG tablet Take 10 mg by mouth See admin instructions. Take 2 tablets (10 mg) by mouth at onset of migraine, may repeat in 2 hours if still needed 11/30/21   [provider]  traZODone (DESYREL) 100 MG tablet Take 1 tablet (100 mg total) by mouth at bedtime as needed for sleep. Patient taking differently: Take 100 mg by mouth at bedtime. 05/26/22   Comer Locket, MD      Allergies    Patient has no known allergies.    Review of Systems   Review of Systems  Physical Exam Updated Vital Signs BP (!) 157/118 (BP Location: Right Arm)   Pulse (!) 116   Temp 98.3 F (36.8 C) (Oral)   Resp 18   Ht 5\' 1"  (1.549 m)   Wt 59 kg   LMP 02/09/2014 Comment: AUB  SpO2 99%   BMI 24.56 kg/m  Physical Exam Vitals and nursing note reviewed.  Constitutional:      General: She is not in acute distress.    Appearance: She is well-developed.  HENT:     Head: Normocephalic and atraumatic.     Mouth/Throat:     Mouth: Mucous membranes are moist.  Eyes:     Conjunctiva/sclera: Conjunctivae normal.  Cardiovascular:     Rate and  Rhythm: Normal rate and regular rhythm.     Heart sounds: No murmur heard. Pulmonary:     Effort: Pulmonary effort is normal. No respiratory distress.     Breath sounds: Normal breath sounds.  Abdominal:     Palpations: Abdomen is soft.     Tenderness: There is no abdominal tenderness.  Musculoskeletal:        General: No swelling.     Cervical back: Neck supple.  Skin:    General: Skin is warm and dry.     Capillary Refill: Capillary refill takes less than 2 seconds.  Neurological:     General: No focal deficit present.     Mental Status: She is alert and oriented to person, place, and time.  Psychiatric:        Mood and Affect: Mood normal.     ED Results / Procedures / Treatments   Labs (all labs ordered are listed, but only abnormal results are displayed) Labs Reviewed  CBC WITH DIFFERENTIAL/PLATELET - Abnormal; Notable for the following components:      Result Value   Hemoglobin 16.3 (*)    MCH 34.5 (*)    All other components within normal limits  COMPREHENSIVE METABOLIC PANEL - Abnormal; Notable for the following  components:   Glucose, Bld 129 (*)    AST 108 (*)    ALT 63 (*)    Anion gap 16 (*)    All other components within normal limits  ETHANOL - Abnormal; Notable for the following components:   Alcohol, Ethyl (B) 337 (*)    All other components within normal limits    EKG None  Radiology No results found.  Procedures Procedures  {Document cardiac monitor, telemetry assessment procedure when appropriate:1}  Medications Ordered in ED Medications - No data to display  ED Course/ Medical Decision Making/ A&P   {   Click here for ABCD2, HEART and other calculatorsREFRESH Note before signing :1}                          Medical Decision Making  ***  {Document critical care time when appropriate:1} {Document review of labs and clinical decision tools ie heart score, Chads2Vasc2 etc:1}  {Document your independent review of radiology images, and  any outside records:1} {Document your discussion with family members, caretakers, and with consultants:1} {Document social determinants of health affecting pt's care:1} {Document your decision making why or why not admission, treatments were needed:1} Final Clinical Impression(s) / ED Diagnoses Final diagnoses:  None    Rx / DC Orders ED Discharge Orders     None

## 2023-03-15 NOTE — ED Provider Triage Note (Signed)
Emergency Medicine Provider Triage Evaluation Note  [name] , a 62 y.o. female  was evaluated in triage.  Pt complains of etoh intoxication and requests for detox.  Review of Systems  Positive:  Negative:   Physical Exam  BP (!) 157/118 (BP Location: Right Arm)   Pulse (!) 116   Temp 98.3 F (36.8 C) (Oral)   Resp 18   Ht 5\' 1"  (1.549 m)   Wt 59 kg   LMP 02/09/2014 Comment: AUB  SpO2 99%   BMI 24.56 kg/m  Gen:   Awake, no distress   Resp:  Normal effort  MSK:   Moves extremities without difficulty  Other:    Medical Decision Making  Medically screening exam initiated at 9:08 PM.  Appropriate orders placed.  [name] was informed that the remainder of the evaluation will be completed by another provider, this initial triage assessment does not replace that evaluation, and the importance of remaining in the ED until their evaluation is complete.     Glyn Ade, MD 03/15/23 2113

## 2023-03-15 NOTE — ED Notes (Signed)
Called [employer] cab for pt to get home, self pay.

## 2023-04-06 DIAGNOSIS — F1010 Alcohol abuse, uncomplicated: Secondary | ICD-10-CM | POA: Diagnosis not present

## 2023-04-06 DIAGNOSIS — Z20822 Contact with and (suspected) exposure to covid-19: Secondary | ICD-10-CM | POA: Diagnosis not present

## 2023-04-06 DIAGNOSIS — F419 Anxiety disorder, unspecified: Secondary | ICD-10-CM | POA: Diagnosis not present

## 2023-04-06 DIAGNOSIS — G8929 Other chronic pain: Secondary | ICD-10-CM | POA: Diagnosis not present

## 2023-04-06 DIAGNOSIS — F332 Major depressive disorder, recurrent severe without psychotic features: Secondary | ICD-10-CM | POA: Diagnosis not present

## 2023-04-06 DIAGNOSIS — F1120 Opioid dependence, uncomplicated: Secondary | ICD-10-CM | POA: Diagnosis not present

## 2023-04-06 DIAGNOSIS — Z6372 Alcoholism and drug addiction in family: Secondary | ICD-10-CM | POA: Diagnosis not present

## 2023-04-06 DIAGNOSIS — F339 Major depressive disorder, recurrent, unspecified: Secondary | ICD-10-CM | POA: Diagnosis not present

## 2023-04-06 DIAGNOSIS — R079 Chest pain, unspecified: Secondary | ICD-10-CM | POA: Diagnosis not present

## 2023-04-06 DIAGNOSIS — M549 Dorsalgia, unspecified: Secondary | ICD-10-CM | POA: Diagnosis not present

## 2023-04-06 DIAGNOSIS — G894 Chronic pain syndrome: Secondary | ICD-10-CM | POA: Diagnosis not present

## 2023-04-06 DIAGNOSIS — I10 Essential (primary) hypertension: Secondary | ICD-10-CM | POA: Diagnosis not present

## 2023-04-06 DIAGNOSIS — F331 Major depressive disorder, recurrent, moderate: Secondary | ICD-10-CM | POA: Diagnosis not present

## 2023-04-06 DIAGNOSIS — F1020 Alcohol dependence, uncomplicated: Secondary | ICD-10-CM | POA: Diagnosis not present

## 2023-04-06 DIAGNOSIS — Z79899 Other long term (current) drug therapy: Secondary | ICD-10-CM | POA: Diagnosis not present

## 2023-04-06 DIAGNOSIS — F10230 Alcohol dependence with withdrawal, uncomplicated: Secondary | ICD-10-CM | POA: Diagnosis not present

## 2023-04-06 DIAGNOSIS — F32A Depression, unspecified: Secondary | ICD-10-CM | POA: Diagnosis not present

## 2023-04-07 DIAGNOSIS — F339 Major depressive disorder, recurrent, unspecified: Secondary | ICD-10-CM | POA: Diagnosis not present

## 2023-04-07 DIAGNOSIS — F1120 Opioid dependence, uncomplicated: Secondary | ICD-10-CM | POA: Diagnosis not present

## 2023-04-07 DIAGNOSIS — F1020 Alcohol dependence, uncomplicated: Secondary | ICD-10-CM | POA: Diagnosis not present

## 2023-04-08 DIAGNOSIS — F1120 Opioid dependence, uncomplicated: Secondary | ICD-10-CM | POA: Diagnosis not present

## 2023-04-08 DIAGNOSIS — F331 Major depressive disorder, recurrent, moderate: Secondary | ICD-10-CM | POA: Diagnosis not present

## 2023-04-08 DIAGNOSIS — F10230 Alcohol dependence with withdrawal, uncomplicated: Secondary | ICD-10-CM | POA: Diagnosis not present

## 2023-04-09 DIAGNOSIS — F1120 Opioid dependence, uncomplicated: Secondary | ICD-10-CM | POA: Diagnosis not present

## 2023-04-09 DIAGNOSIS — F10230 Alcohol dependence with withdrawal, uncomplicated: Secondary | ICD-10-CM | POA: Diagnosis not present

## 2023-04-09 DIAGNOSIS — F331 Major depressive disorder, recurrent, moderate: Secondary | ICD-10-CM | POA: Diagnosis not present

## 2023-04-10 DIAGNOSIS — I10 Essential (primary) hypertension: Secondary | ICD-10-CM | POA: Diagnosis not present

## 2023-04-10 DIAGNOSIS — F1020 Alcohol dependence, uncomplicated: Secondary | ICD-10-CM | POA: Diagnosis not present

## 2023-04-10 DIAGNOSIS — F1120 Opioid dependence, uncomplicated: Secondary | ICD-10-CM | POA: Diagnosis not present

## 2023-04-10 DIAGNOSIS — F332 Major depressive disorder, recurrent severe without psychotic features: Secondary | ICD-10-CM | POA: Diagnosis not present

## 2023-04-10 DIAGNOSIS — G894 Chronic pain syndrome: Secondary | ICD-10-CM | POA: Diagnosis not present

## 2023-04-12 DIAGNOSIS — F1020 Alcohol dependence, uncomplicated: Secondary | ICD-10-CM | POA: Diagnosis not present

## 2023-04-12 DIAGNOSIS — F32A Depression, unspecified: Secondary | ICD-10-CM | POA: Diagnosis not present

## 2023-04-13 DIAGNOSIS — F32A Depression, unspecified: Secondary | ICD-10-CM | POA: Diagnosis not present

## 2023-04-13 DIAGNOSIS — F1020 Alcohol dependence, uncomplicated: Secondary | ICD-10-CM | POA: Diagnosis not present

## 2023-04-24 DIAGNOSIS — E782 Mixed hyperlipidemia: Secondary | ICD-10-CM | POA: Diagnosis not present

## 2023-04-24 DIAGNOSIS — M5459 Other low back pain: Secondary | ICD-10-CM | POA: Diagnosis not present

## 2023-04-24 DIAGNOSIS — Z79891 Long term (current) use of opiate analgesic: Secondary | ICD-10-CM | POA: Diagnosis not present

## 2023-04-24 DIAGNOSIS — M5136 Other intervertebral disc degeneration, lumbar region: Secondary | ICD-10-CM | POA: Diagnosis not present

## 2023-04-24 DIAGNOSIS — Z8659 Personal history of other mental and behavioral disorders: Secondary | ICD-10-CM | POA: Diagnosis not present

## 2023-04-24 DIAGNOSIS — G43909 Migraine, unspecified, not intractable, without status migrainosus: Secondary | ICD-10-CM | POA: Diagnosis not present

## 2023-04-24 DIAGNOSIS — Z6826 Body mass index (BMI) 26.0-26.9, adult: Secondary | ICD-10-CM | POA: Diagnosis not present

## 2023-04-24 DIAGNOSIS — F419 Anxiety disorder, unspecified: Secondary | ICD-10-CM | POA: Diagnosis not present

## 2023-04-24 DIAGNOSIS — R7303 Prediabetes: Secondary | ICD-10-CM | POA: Diagnosis not present

## 2023-04-24 DIAGNOSIS — G894 Chronic pain syndrome: Secondary | ICD-10-CM | POA: Diagnosis not present

## 2023-04-24 DIAGNOSIS — F1010 Alcohol abuse, uncomplicated: Secondary | ICD-10-CM | POA: Diagnosis not present

## 2023-04-24 DIAGNOSIS — I10 Essential (primary) hypertension: Secondary | ICD-10-CM | POA: Diagnosis not present

## 2023-05-16 DIAGNOSIS — Z0000 Encounter for general adult medical examination without abnormal findings: Secondary | ICD-10-CM | POA: Diagnosis not present

## 2023-05-16 DIAGNOSIS — E663 Overweight: Secondary | ICD-10-CM | POA: Diagnosis not present

## 2023-05-16 DIAGNOSIS — Z1389 Encounter for screening for other disorder: Secondary | ICD-10-CM | POA: Diagnosis not present

## 2023-05-25 DIAGNOSIS — F1020 Alcohol dependence, uncomplicated: Secondary | ICD-10-CM | POA: Diagnosis not present

## 2023-05-25 DIAGNOSIS — M5459 Other low back pain: Secondary | ICD-10-CM | POA: Diagnosis not present

## 2023-05-25 DIAGNOSIS — G894 Chronic pain syndrome: Secondary | ICD-10-CM | POA: Diagnosis not present

## 2023-05-25 DIAGNOSIS — Z79891 Long term (current) use of opiate analgesic: Secondary | ICD-10-CM | POA: Diagnosis not present

## 2023-05-25 DIAGNOSIS — M5136 Other intervertebral disc degeneration, lumbar region: Secondary | ICD-10-CM | POA: Diagnosis not present

## 2023-06-05 DIAGNOSIS — M545 Low back pain, unspecified: Secondary | ICD-10-CM | POA: Diagnosis not present

## 2023-06-05 DIAGNOSIS — M542 Cervicalgia: Secondary | ICD-10-CM | POA: Diagnosis not present

## 2023-06-07 DIAGNOSIS — Z79891 Long term (current) use of opiate analgesic: Secondary | ICD-10-CM | POA: Diagnosis not present

## 2023-06-07 DIAGNOSIS — M5416 Radiculopathy, lumbar region: Secondary | ICD-10-CM | POA: Diagnosis not present

## 2023-06-09 ENCOUNTER — Ambulatory Visit
Admission: RE | Admit: 2023-06-09 | Discharge: 2023-06-09 | Disposition: A | Discharge status: Home / Self Care | Payer: Medicare HMO | Source: Ambulatory Visit | Attending: Anesthesiology | Admitting: Anesthesiology

## 2023-06-09 ENCOUNTER — Other Ambulatory Visit: Payer: Self-pay | Admitting: Anesthesiology

## 2023-06-09 DIAGNOSIS — M5416 Radiculopathy, lumbar region: Secondary | ICD-10-CM

## 2023-06-09 DIAGNOSIS — M545 Low back pain, unspecified: Secondary | ICD-10-CM | POA: Diagnosis not present

## 2023-06-12 ENCOUNTER — Other Ambulatory Visit: Payer: Self-pay | Admitting: Anesthesiology

## 2023-06-12 DIAGNOSIS — M5416 Radiculopathy, lumbar region: Secondary | ICD-10-CM

## 2023-06-26 ENCOUNTER — Other Ambulatory Visit: Payer: Medicare HMO

## 2023-06-28 ENCOUNTER — Other Ambulatory Visit: Payer: Medicare HMO

## 2023-06-28 ENCOUNTER — Ambulatory Visit: Admission: RE | Admit: 2023-06-28 | Payer: Medicare HMO | Source: Ambulatory Visit

## 2023-06-28 DIAGNOSIS — M5416 Radiculopathy, lumbar region: Secondary | ICD-10-CM | POA: Diagnosis not present

## 2023-07-03 ENCOUNTER — Other Ambulatory Visit: Payer: Medicare HMO

## 2023-07-04 DIAGNOSIS — M5416 Radiculopathy, lumbar region: Secondary | ICD-10-CM | POA: Diagnosis not present

## 2023-07-04 DIAGNOSIS — Z79891 Long term (current) use of opiate analgesic: Secondary | ICD-10-CM | POA: Diagnosis not present

## 2023-07-25 DIAGNOSIS — N951 Menopausal and female climacteric states: Secondary | ICD-10-CM | POA: Diagnosis not present

## 2023-07-25 DIAGNOSIS — Z0000 Encounter for general adult medical examination without abnormal findings: Secondary | ICD-10-CM | POA: Diagnosis not present

## 2023-07-25 DIAGNOSIS — E559 Vitamin D deficiency, unspecified: Secondary | ICD-10-CM | POA: Diagnosis not present

## 2023-07-25 DIAGNOSIS — Z79899 Other long term (current) drug therapy: Secondary | ICD-10-CM | POA: Diagnosis not present

## 2023-07-25 DIAGNOSIS — Z113 Encounter for screening for infections with a predominantly sexual mode of transmission: Secondary | ICD-10-CM | POA: Diagnosis not present

## 2023-07-25 DIAGNOSIS — G4700 Insomnia, unspecified: Secondary | ICD-10-CM | POA: Diagnosis not present

## 2023-07-25 DIAGNOSIS — F32A Depression, unspecified: Secondary | ICD-10-CM | POA: Diagnosis not present

## 2023-07-25 DIAGNOSIS — E7800 Pure hypercholesterolemia, unspecified: Secondary | ICD-10-CM | POA: Diagnosis not present

## 2023-07-25 DIAGNOSIS — Z6826 Body mass index (BMI) 26.0-26.9, adult: Secondary | ICD-10-CM | POA: Diagnosis not present

## 2023-07-25 DIAGNOSIS — Z131 Encounter for screening for diabetes mellitus: Secondary | ICD-10-CM | POA: Diagnosis not present

## 2023-09-27 DIAGNOSIS — M5416 Radiculopathy, lumbar region: Secondary | ICD-10-CM | POA: Diagnosis not present

## 2023-10-14 DIAGNOSIS — R300 Dysuria: Secondary | ICD-10-CM | POA: Diagnosis not present

## 2023-10-19 DIAGNOSIS — Z87442 Personal history of urinary calculi: Secondary | ICD-10-CM | POA: Diagnosis not present

## 2023-10-19 DIAGNOSIS — R351 Nocturia: Secondary | ICD-10-CM | POA: Diagnosis not present

## 2023-10-19 DIAGNOSIS — R1031 Right lower quadrant pain: Secondary | ICD-10-CM | POA: Diagnosis not present

## 2023-10-24 DIAGNOSIS — N132 Hydronephrosis with renal and ureteral calculous obstruction: Secondary | ICD-10-CM | POA: Diagnosis not present

## 2023-10-24 DIAGNOSIS — R1031 Right lower quadrant pain: Secondary | ICD-10-CM | POA: Diagnosis not present

## 2023-10-24 DIAGNOSIS — N134 Hydroureter: Secondary | ICD-10-CM | POA: Diagnosis not present

## 2023-10-24 DIAGNOSIS — D3502 Benign neoplasm of left adrenal gland: Secondary | ICD-10-CM | POA: Diagnosis not present

## 2023-10-25 ENCOUNTER — Other Ambulatory Visit: Payer: Self-pay | Admitting: Urology

## 2023-10-26 ENCOUNTER — Encounter (HOSPITAL_BASED_OUTPATIENT_CLINIC_OR_DEPARTMENT_OTHER): Payer: Self-pay | Admitting: Urology

## 2023-10-26 NOTE — Progress Notes (Signed)
Spoke w/ via phone for pre-op interview--- [name] needs dos----              n/a COVID test -----patient states asymptomatic no test needed Arrive at ------- 0645 NPO after MN NO Solid Food.  Clear liquids from MN until--- sip of water with meds only Med rec completed. Pt aware to hold ASA/NSAIDs and supplements per [facility] protocol.  Medications to take morning of surgery ----- tylenol or hydrocodone prn Diabetic/Weight loss medication ----- n/a No Alcohol or recreational drugs for 24 hours/Tobacco products for 6 hours ---- pt aware Patient instructed to bring blue lithotripsy folder, photo id and insurance card day of surgery. Patient aware to have Driver (ride ) / caregiver for 24 hours after surgery ----- [name] (friend) Patient Special Instructions ----- n/a Pre-Op special Instructions ----- take laxative of choice day before procedure Patient verbalized understanding of instructions that were given at this phone interview. Patient denies shortness of breath, chest pain, fever, cough at this phone interview.

## 2023-10-30 ENCOUNTER — Encounter (HOSPITAL_BASED_OUTPATIENT_CLINIC_OR_DEPARTMENT_OTHER)
Admission: RE | Disposition: A | Discharge status: Home / Self Care | Payer: Self-pay | Source: Ambulatory Visit | Attending: Urology

## 2023-10-30 ENCOUNTER — Encounter (HOSPITAL_BASED_OUTPATIENT_CLINIC_OR_DEPARTMENT_OTHER): Payer: Self-pay | Admitting: Urology

## 2023-10-30 ENCOUNTER — Ambulatory Visit (HOSPITAL_COMMUNITY): Payer: Medicare HMO

## 2023-10-30 ENCOUNTER — Ambulatory Visit (HOSPITAL_BASED_OUTPATIENT_CLINIC_OR_DEPARTMENT_OTHER)
Admission: RE | Admit: 2023-10-30 | Discharge: 2023-10-30 | Disposition: A | Discharge status: Home / Self Care | Payer: Medicare HMO | Source: Ambulatory Visit | Attending: Urology | Admitting: Urology

## 2023-10-30 DIAGNOSIS — I10 Essential (primary) hypertension: Secondary | ICD-10-CM | POA: Diagnosis not present

## 2023-10-30 DIAGNOSIS — Z87891 Personal history of nicotine dependence: Secondary | ICD-10-CM | POA: Diagnosis not present

## 2023-10-30 DIAGNOSIS — N201 Calculus of ureter: Secondary | ICD-10-CM | POA: Insufficient documentation

## 2023-10-30 DIAGNOSIS — N200 Calculus of kidney: Secondary | ICD-10-CM | POA: Diagnosis not present

## 2023-10-30 HISTORY — PX: EXTRACORPOREAL SHOCK WAVE LITHOTRIPSY: SHX1557

## 2023-10-30 HISTORY — DX: Other intervertebral disc degeneration, lumbar region without mention of lumbar back pain or lower extremity pain: M51.369

## 2023-10-30 SURGERY — LITHOTRIPSY, ESWL
Anesthesia: LOCAL | Laterality: Right

## 2023-10-30 MED ORDER — SODIUM CHLORIDE 0.9 % IV SOLN: INTRAVENOUS | Status: DC

## 2023-10-30 MED ORDER — HYDROCODONE-ACETAMINOPHEN 5-325 MG PO TABS: 1.0000 | ORAL_TABLET | Freq: Four times a day (QID) | ORAL | 0 refills | Status: AC | PRN

## 2023-10-30 MED ORDER — DIAZEPAM 5 MG PO TABS
ORAL_TABLET | ORAL | Status: AC
  Filled 2023-10-30: qty 2

## 2023-10-30 MED ORDER — DIPHENHYDRAMINE HCL 25 MG PO CAPS
25.0000 mg | ORAL_CAPSULE | ORAL | Status: AC
  Administered 2023-10-30: 25 mg via ORAL

## 2023-10-30 MED ORDER — CIPROFLOXACIN HCL 500 MG PO TABS
500.0000 mg | ORAL_TABLET | ORAL | Status: AC
  Administered 2023-10-30: 500 mg via ORAL

## 2023-10-30 MED ORDER — CIPROFLOXACIN HCL 500 MG PO TABS
ORAL_TABLET | ORAL | Status: AC
Start: 2023-10-30 — End: ?
  Filled 2023-10-30: qty 1

## 2023-10-30 MED ORDER — DIAZEPAM 5 MG PO TABS
10.0000 mg | ORAL_TABLET | ORAL | Status: AC
  Administered 2023-10-30: 10 mg via ORAL

## 2023-10-30 MED ORDER — DOCUSATE SODIUM 100 MG PO CAPS: 100.0000 mg | ORAL_CAPSULE | Freq: Every day | ORAL | 0 refills | Status: AC | PRN

## 2023-10-30 MED ORDER — DIPHENHYDRAMINE HCL 25 MG PO CAPS
ORAL_CAPSULE | ORAL | Status: AC
  Filled 2023-10-30: qty 1

## 2023-10-30 NOTE — H&P (Signed)
Urology Preoperative H&P   Chief Complaint: Right ureteral stone  History of Present Illness: [name] is a 62 y.o. female with a right ureteral stone here for right ESWL. Denies fevers, chills, dysuria.    Past Medical History:  Diagnosis Date   Alcohol dependence (HCC)    recent IVC at [facility] 10/ 2015 by son--  11-07-2014 pt states no alcohol since 09-11-2014 (approx)   Anxiety    DDD (degenerative disc disease), lumbar    Dr. Ether Griffins with [facility], Epidural injection 09/2023   Frequency of urination    GERD (gastroesophageal reflux disease)    Headache(784.0)    Migraines   Hypertension    no meds per pt,  states bp been ok    Major depression    PTSD (post-traumatic stress disorder)    Renal calculus, right    Right ureteral stone    Urgency of urination     Past Surgical History:  Procedure Laterality Date   BILATERAL SALPINGECTOMY Bilateral 03/19/2014   Procedure: BILATERAL SALPINGECTOMY;  Surgeon: Lenoard Aden, MD;  Location: [facility];  Service: Gynecology;  Laterality: Bilateral;   cataracts     2016   CESAREAN SECTION  12/05/1989   colonscopy     HOLMIUM LASER APPLICATION Right 11/12/2014   Procedure: HOLMIUM LASER APPLICATION;  Surgeon: Sebastian Ache, MD;  Location: [facility];  Service: Urology;  Laterality: Right;   ROBOTIC ASSISTED TOTAL HYSTERECTOMY N/A 03/19/2014   Procedure: ROBOTIC ASSISTED TOTAL HYSTERECTOMY;  Surgeon: Lenoard Aden, MD;  Location: [facility];  Service: Gynecology;  Laterality: N/A;   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 Her smoking use included cigarettes. She has never used smokeless tobacco. She reports current alcohol use of about 20.0 standard drinks of alcohol per  week. She reports that she does not currently use drugs after having used the following drugs: Marijuana and Benzodiazepines.  ROS: A complete review of systems was performed.  All systems are negative except for pertinent findings as noted.  Physical Exam:  Vital signs in last 24 hours: Temp:  [97.5 F (36.4 C)] 97.5 F (36.4 C) (11/25 0721) Pulse Rate:  [95] 95 (11/25 0721) Resp:  [16] 16 (11/25 0721) BP: (157)/(107) 157/107 (11/25 0721) SpO2:  [98 %] 98 % (11/25 0721) Weight:  [60.2 kg] 60.2 kg (11/25 0721) Constitutional:  Alert and oriented, No acute distress Cardiovascular: Regular rate and rhythm Respiratory: Normal respiratory effort, Lungs clear bilaterally GI: Abdomen is soft, nontender, nondistended, no abdominal masses GU: No CVA tenderness Lymphatic: No lymphadenopathy Neurologic: Grossly intact, no focal deficits Psychiatric: Normal mood and affect  Laboratory Data:  No results for input(s): "WBC", "HGB", "HCT", "PLT" in the last 72 hours.  No results for input(s): "NA", "K", "CL", "GLUCOSE", "BUN", "CALCIUM", "CREATININE" in the last 72 hours.  Invalid input(s): "CO3"   No results found for this or any previous visit (from the past 24 hour(s)). No results found for this or any previous visit (from the past 240 hour(s)).  Renal Function: No results for input(s): "CREATININE" in the last 168 hours. CrCl cannot be calculated (Patient's most recent lab result is older than the maximum 21 days allowed.).  Radiologic Imaging: No results found.  I independently reviewed the above imaging studies.  Assessment and Plan [name] is a 62 y.o. female with a right ureteral stone here for ESWL.  The risks, benefits and alternatives of right ESWL was discussed with the patient. I described the risks which include arrhythmia, kidney contusion, kidney hemorrhage, need for transfusion, back discomfort, flank ecchymosis, flank abrasion, inability to fracture the stone,  inability to pass stone fragments, Steinstrasse, infection associated with obstructing stones, need for an alternative surgical procedure and possible need for repeat shockwave lithotripsy.  The patient voices understanding and wishes to proceed.     Matt R. Mallori Araque MD 10/30/2023, 9:06 AM  [facility] Pager: [phone]): [phone]

## 2023-10-30 NOTE — Op Note (Signed)
[facility] Operative Note ? ?Treating Physician: Jettie Pagan, MD ? ?Pre-op diagnosis: Right ureteral stone ? ?Post-op diagnosis: Same  ? ?Procedure: Right ESWL ? ?See [employer] OP note scanned into chart. Also because of the size, density, location and other factors that cannot be anticipated I feel this will likely be a staged procedure. This fact supersedes any indication in the scanned [place] stone operative note to the contrary. ? ?Jill R. Ariz Terrones MD ?[facility]  ?Pager: [phone] ?  ?

## 2023-10-30 NOTE — Discharge Instructions (Addendum)
Activity:  You are encouraged to ambulate frequently (about every hour during waking hours) to help prevent blood clots from forming in your legs or lungs.    Diet: You should advance your diet as instructed by your physician.  It will be normal to have some bloating, nausea, and abdominal discomfort intermittently.  Prescriptions:  You will be provided a prescription for pain medication to take as needed.  If your pain is not severe enough to require the prescription pain medication, you may take extra strength Tylenol instead which will have less side effects.  You should also take a prescribed stool softener to avoid straining with bowel movements as the prescription pain medication may constipate you.  What to call us about: You should call the office [phone]) if you develop fever > 101 or develop persistent vomiting. Activity:  You are encouraged to ambulate frequently (about every hour during waking hours) to help prevent blood clots from forming in your legs or lungs.   Post Anesthesia Home Care Instructions  Activity: Get plenty of rest for the remainder of the day. A responsible adult should stay with you for 24 hours following the procedure.  For the next 24 hours, DO NOT: -Drive a car -Advertising copywriter -Drink alcoholic beverages -Take any medication unless instructed by your physician -Make any legal decisions or sign important papers.  Meals: Start with liquid foods such as gelatin or soup. Progress to regular foods as tolerated. Avoid greasy, spicy, heavy foods. If nausea and/or vomiting occur, drink only clear liquids until the nausea and/or vomiting subsides. Call your physician if vomiting continues.

## 2023-10-31 ENCOUNTER — Encounter (HOSPITAL_BASED_OUTPATIENT_CLINIC_OR_DEPARTMENT_OTHER): Payer: Self-pay | Admitting: Urology

## 2023-11-15 DIAGNOSIS — M5416 Radiculopathy, lumbar region: Secondary | ICD-10-CM | POA: Diagnosis not present

## 2023-11-15 DIAGNOSIS — Z79891 Long term (current) use of opiate analgesic: Secondary | ICD-10-CM | POA: Diagnosis not present

## 2023-11-22 DIAGNOSIS — R0789 Other chest pain: Secondary | ICD-10-CM | POA: Diagnosis not present

## 2023-11-22 DIAGNOSIS — R5383 Other fatigue: Secondary | ICD-10-CM | POA: Diagnosis not present

## 2024-04-13 IMAGING — CT CT CERVICAL SPINE W/O CM
3 of 4 series · 10 of 33 positions shown, 12 images · non-contrast
Comparison: Head CT of 10/07/2021 cervical spine CT of 09/03/2016

CLINICAL DATA: Head and neck trauma, frequent falls. History of
pituitary tumor resection.



[Series 3: sagittal bone · sagittal · 0.20mm/px · 5 of 61 slices shown, 6 images]
[im 21/61  bone]
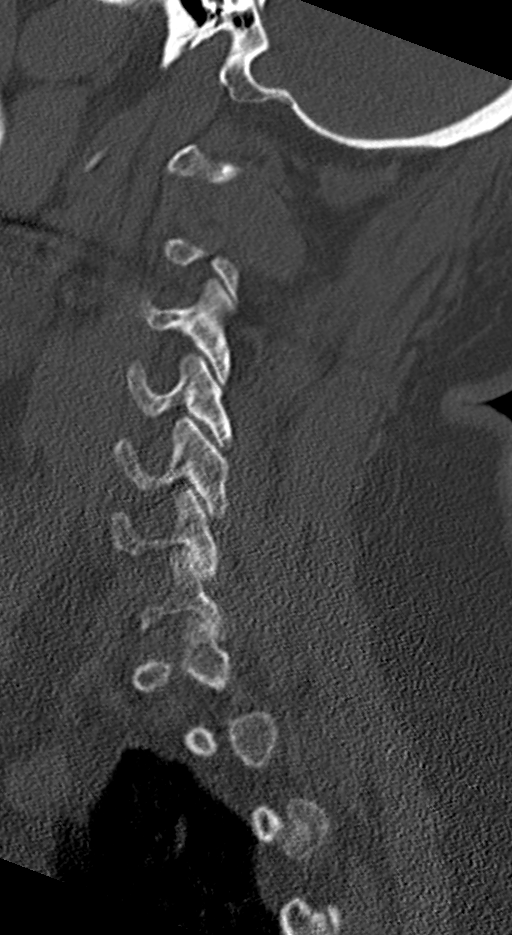
[im 26/61  bone]
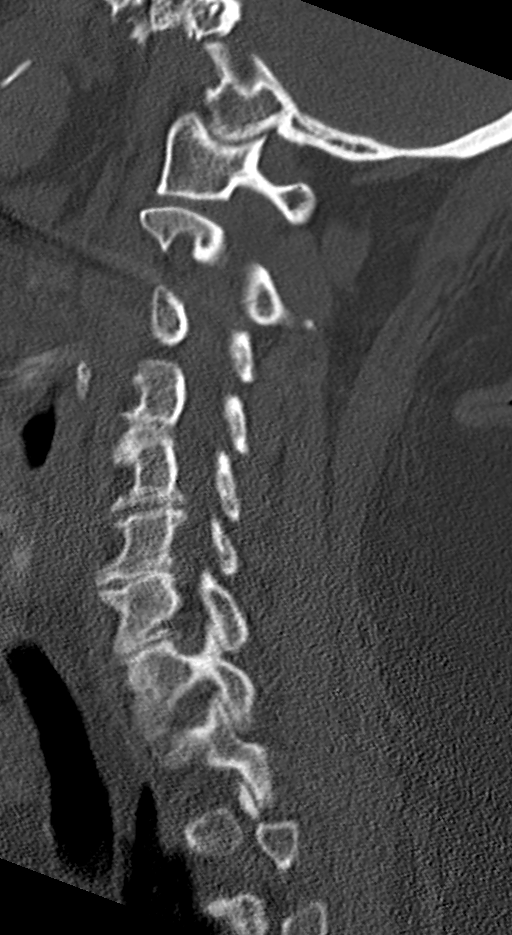
[im 31/61  soft-tissue]
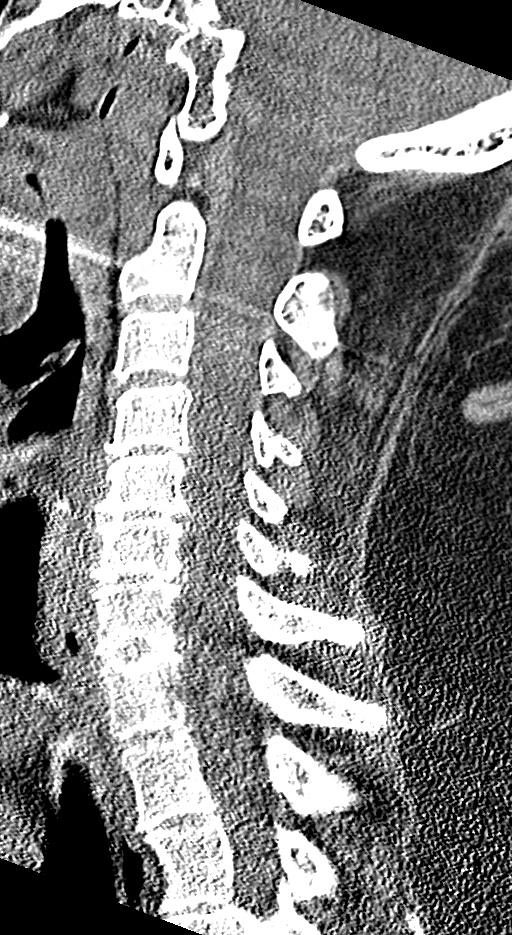
[im 31/61  bone]
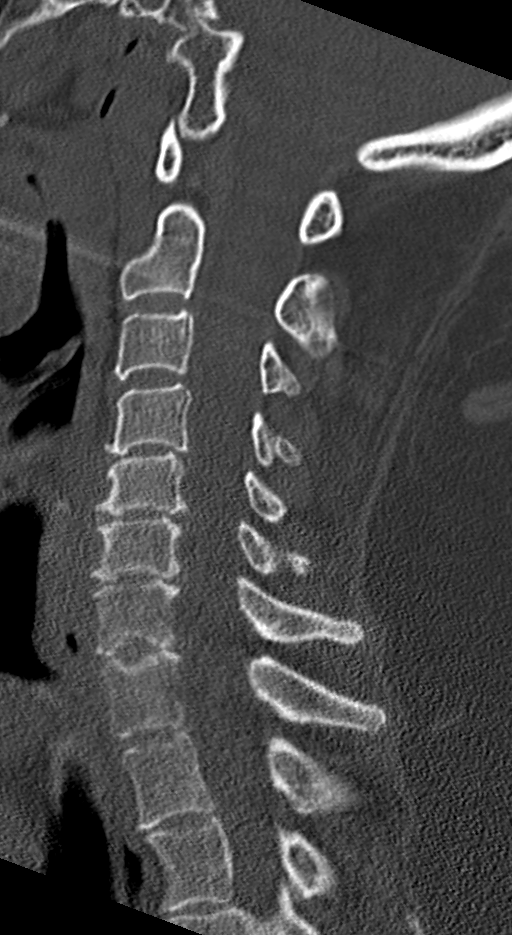
[im 36/61  bone]
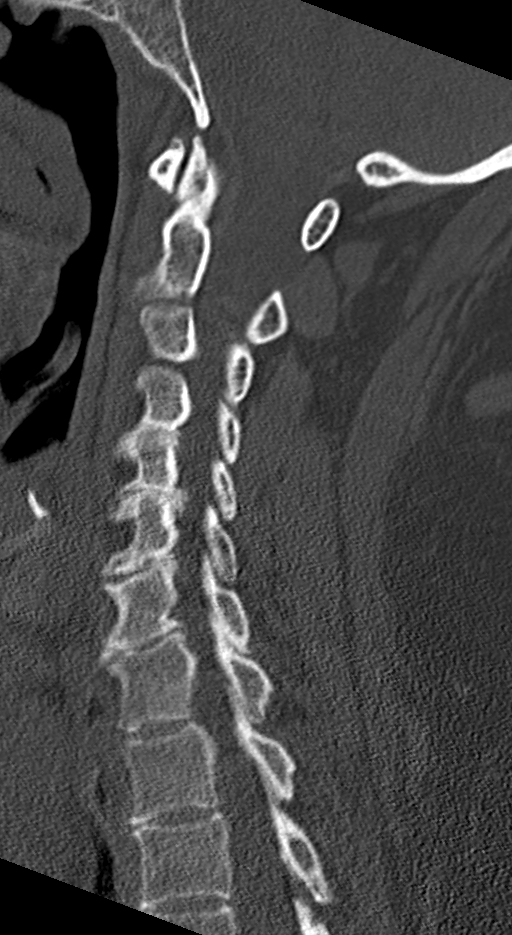
[im 41/61  bone]
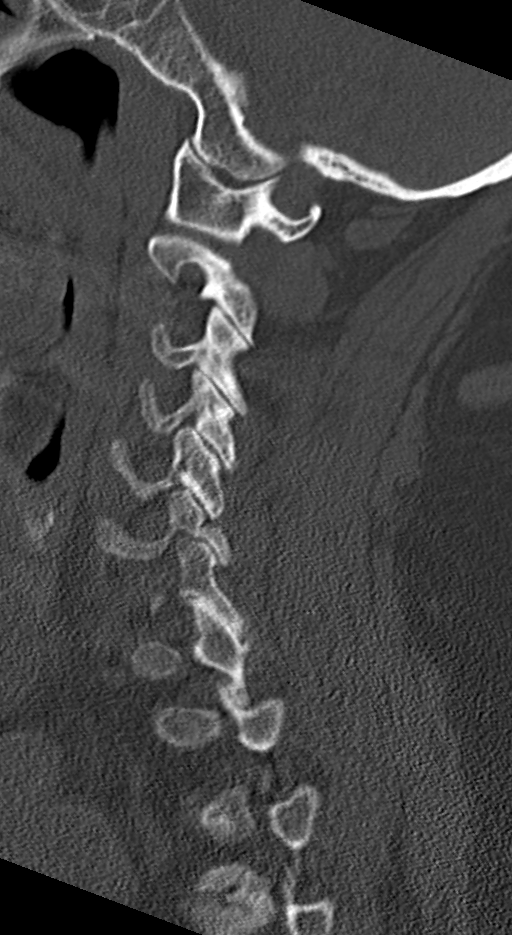

[Series 6: orthogonal bone · axial · 0.23mm/px · z∈[-243,-185]mm · 2 of 90 slices shown, 3 images]
[im 30/90  soft-tissue]
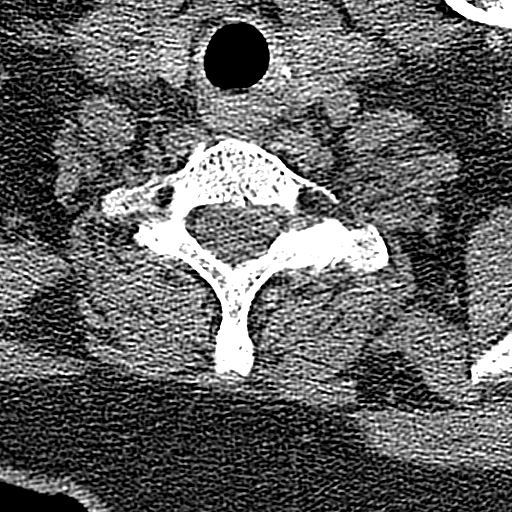
[im 30/90  bone]
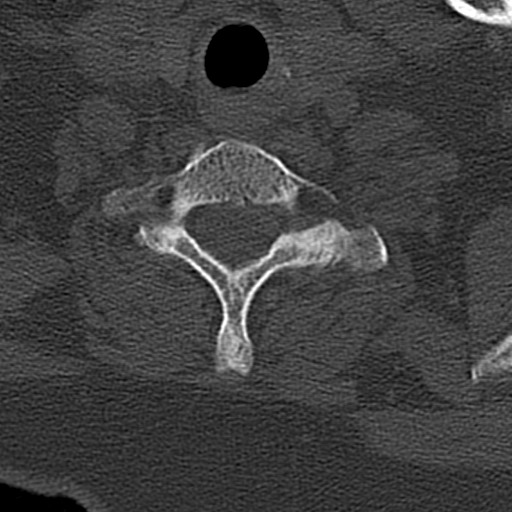
[im 60/90  bone]
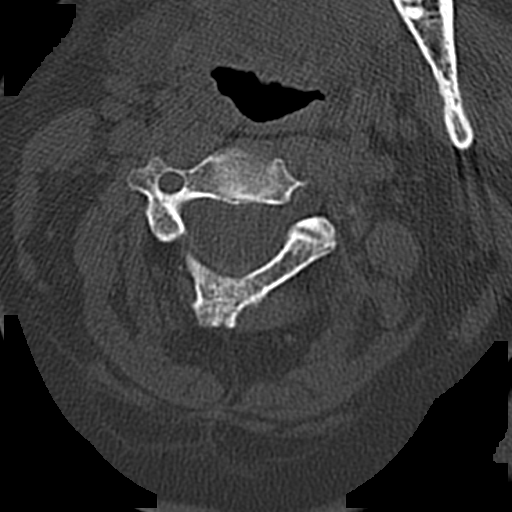

[Series 7: coronal bone · coronal · 0.25mm/px · 3 of 61 slices shown]
[im 13/61  bone]
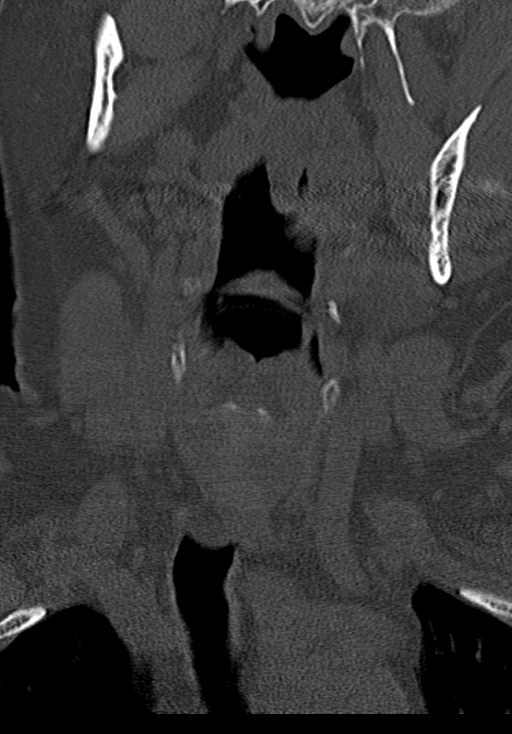
[im 25/61  bone]
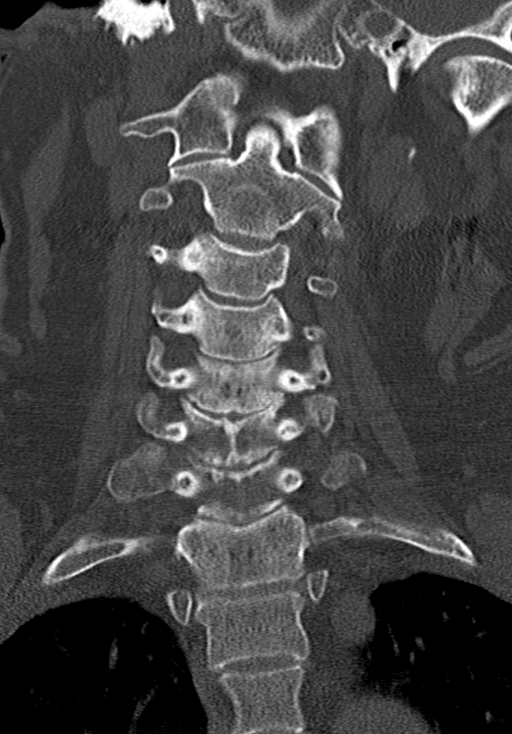
[im 37/61  bone]
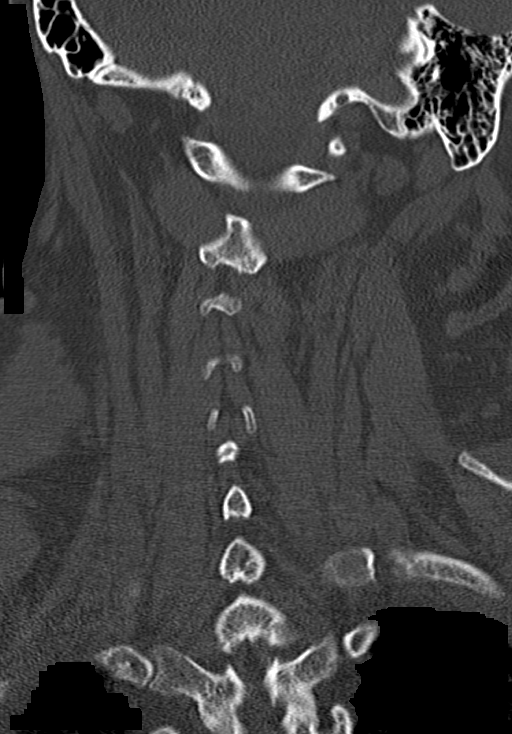

[10 of 33 positions shown; findings below may reference images not displayed]

FINDINGS: CT HEAD FINDINGS

Brain: There is mild cerebral atrophy, small-vessel disease and
atrophic ventriculomegaly without midline shift. Cerebellum and
brainstem are unremarkable.

No asymmetry is seen concerning for an acute infarct, hemorrhage or
mass. The basal cisterns are clear.

Vascular: No hyperdense vessel or unexpected calcification.

Skull: Scalp hematoma right forehead area. The calvarium, skull base
and orbits are intact.

Sinuses/Orbits: No acute abnormality. There are old lens
extractions. Surgical fat packing in the sphenoid sinus is seen
consistent with prior transsphenoidal pituitary surgery.

Other: None.  No mastoid effusion.

CT CERVICAL SPINE FINDINGS

Alignment: There is a stable 2 mm anterolisthesis at C3-4 likely
degenerative. This is unchanged.

No traumatic or further listhesis seen. Mild dextroscoliosis. There
is narrowing and spurring of the anterior atlantodental joint.

Skull base and vertebrae: No acute fracture. No primary bone lesion
or focal pathologic process.

Soft tissues and spinal canal: No prevertebral fluid or swelling. No
visible canal hematoma. There is a low-density mass in the inferior
pole left lobe of the thyroid gland today measuring 2.3 cm,
previously 1.6 cm. Ultrasound follow-up recommended.

Disc levels: The discs are degenerated and there are bidirectional
osteophytes from C3-4 through C7-T1. There is only slight disc space
loss with small anterior osteophytes at C3-4. C2-3 is normal in
height.

Posterior disc osteophyte complexes from C4-5 through C7-T1 variably
encroaching on the thecal sac, with mild spinal canal stenosis and
mild deformity along the ventral cord surface due to disc osteophyte
complex encroachment.

No frank cord compression is seen. There is mild foraminal stenosis
at C5-6 and C6-7 but no critical foraminal stenosis.

Upper chest: Negative.

Other: None.
IMPRESSION: 1. Right forehead scalp hematoma. No acute intracranial CT findings,
depressed skull fractures or further interval changes.
2. Stable atrophy and small-vessel disease.
3. Degenerative changes of the cervical spine and mild
dextroscoliosis without evidence of fractures.
4. 2.3 cm left thyroid mass which was previously 1.6 cm. Thyroid
ultrasound recommended.

## 2024-04-13 IMAGING — CT CT HEAD W/O CM
3 series · 14 of 47 positions shown, 16 images · non-contrast
Comparison: Head CT of 10/07/2021 cervical spine CT of 09/03/2016

CLINICAL DATA: Head and neck trauma, frequent falls. History of
pituitary tumor resection.



[Series 3: head wo · axial · 0.47mm/px · z∈[-119,+6]mm · 8 of 31 slices shown, 10 images]
[im 3/31  brain]
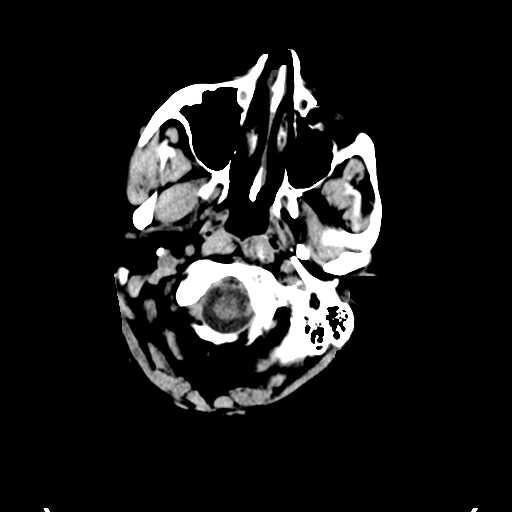
[im 3/31  bone]
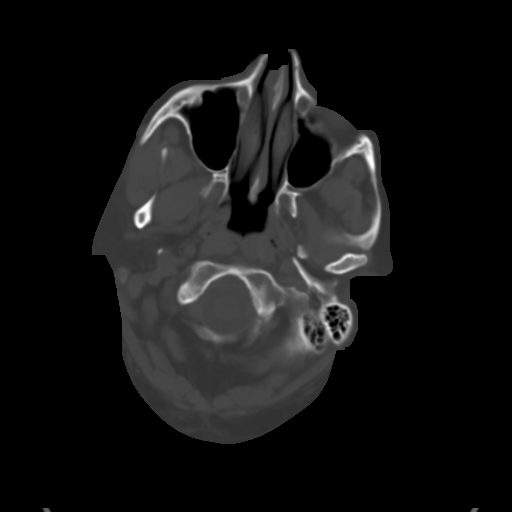
[im 7/31  brain]
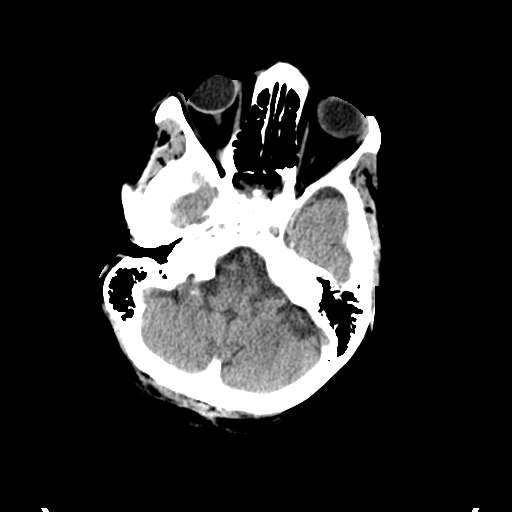
[im 10/31  brain]
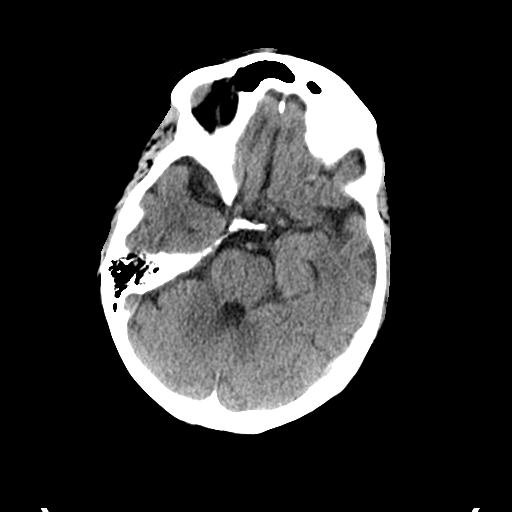
[im 14/31  brain]
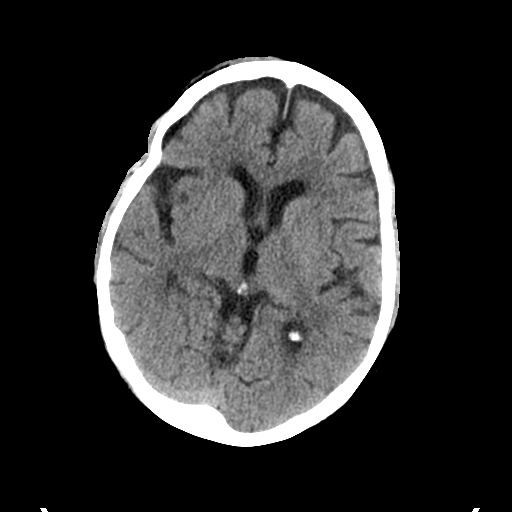
[im 17/31  brain]
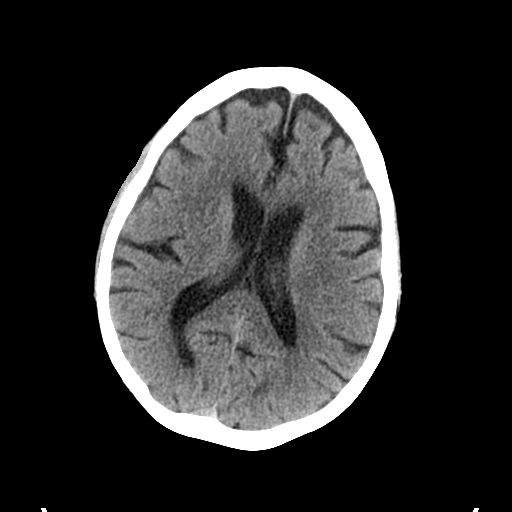
[im 17/31  bone]
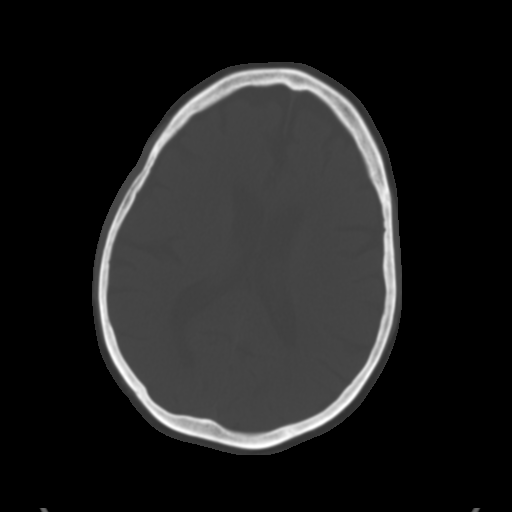
[im 21/31  brain]
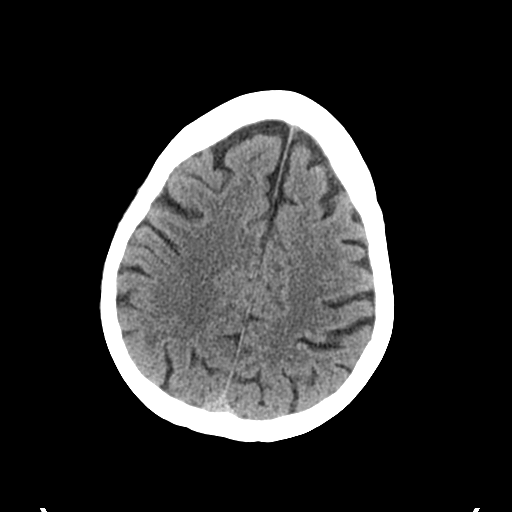
[im 24/31  brain]
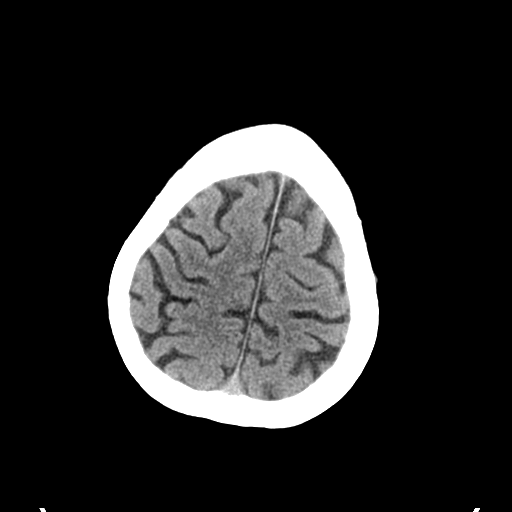
[im 28/31  brain]
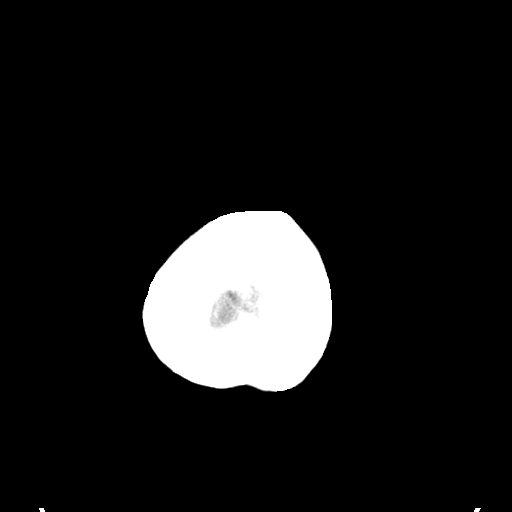

[Series 5: coronal soft tissue · coronal · 0.30mm/px · 3 of 66 slices shown]
[im 22/66  brain]
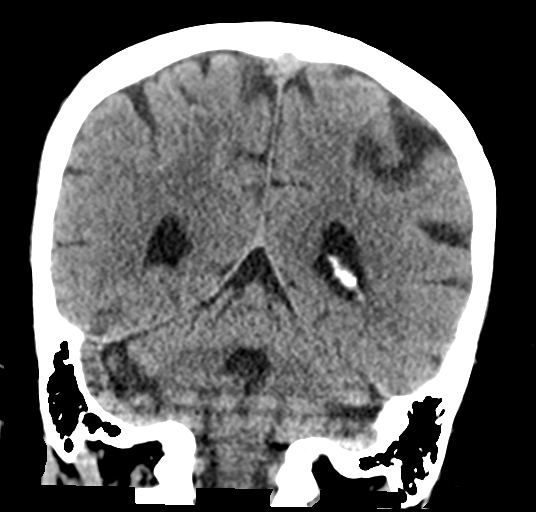
[im 29/66  brain]
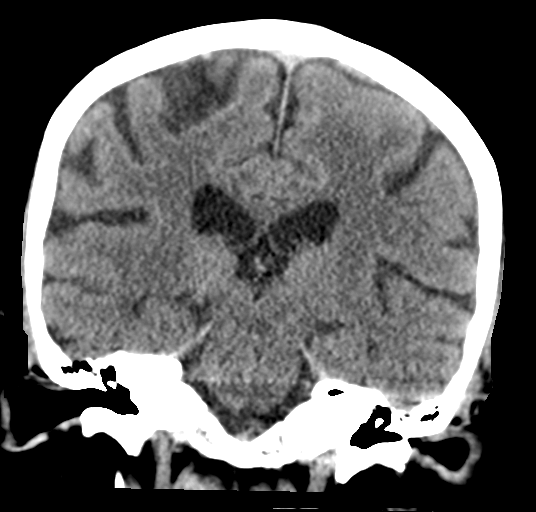
[im 37/66  brain]
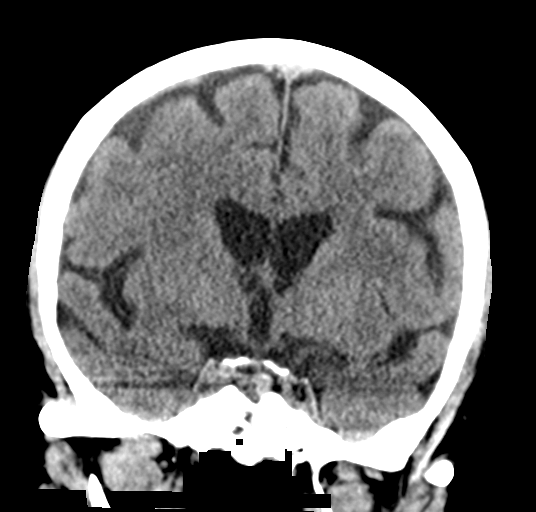

[Series 6: sagittal soft tissue · sagittal · 0.30mm/px · 3 of 52 slices shown]
[im 18/52  brain]
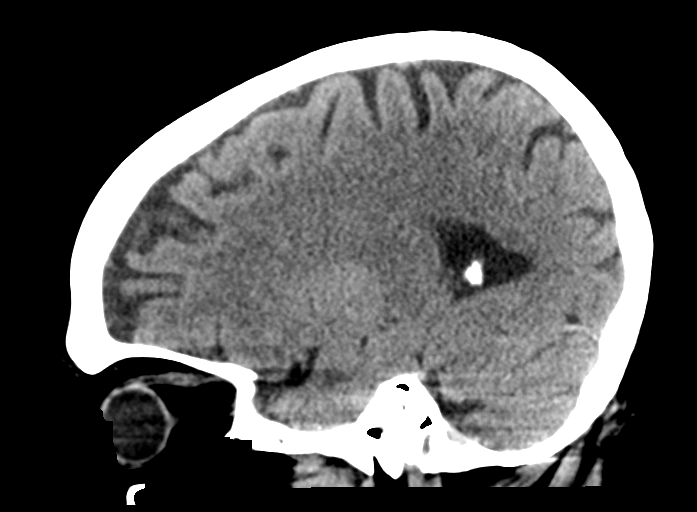
[im 26/52  brain]
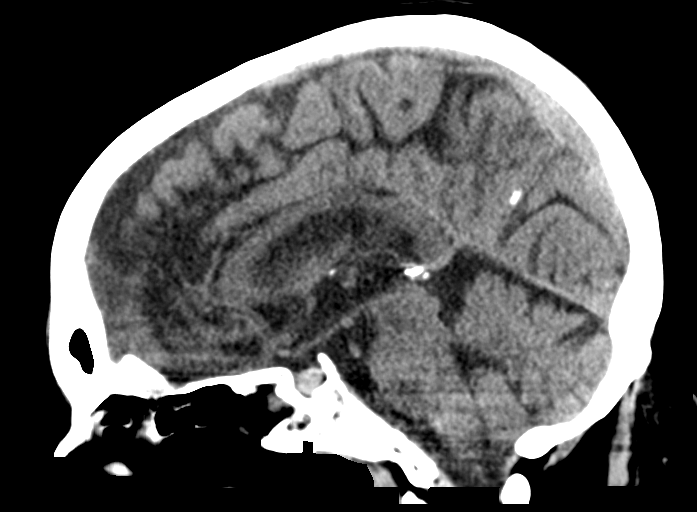
[im 35/52  brain]
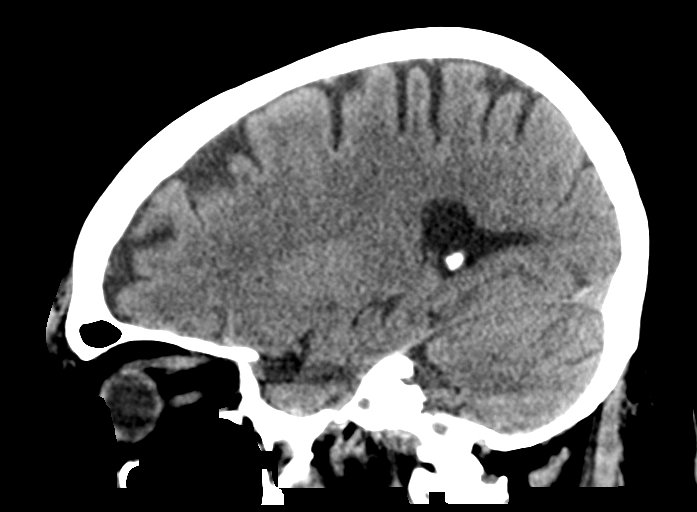

[14 of 47 positions shown; findings below may reference images not displayed]

FINDINGS: CT HEAD FINDINGS

Brain: There is mild cerebral atrophy, small-vessel disease and
atrophic ventriculomegaly without midline shift. Cerebellum and
brainstem are unremarkable.

No asymmetry is seen concerning for an acute infarct, hemorrhage or
mass. The basal cisterns are clear.

Vascular: No hyperdense vessel or unexpected calcification.

Skull: Scalp hematoma right forehead area. The calvarium, skull base
and orbits are intact.

Sinuses/Orbits: No acute abnormality. There are old lens
extractions. Surgical fat packing in the sphenoid sinus is seen
consistent with prior transsphenoidal pituitary surgery.

Other: None.  No mastoid effusion.

CT CERVICAL SPINE FINDINGS

Alignment: There is a stable 2 mm anterolisthesis at C3-4 likely
degenerative. This is unchanged.

No traumatic or further listhesis seen. Mild dextroscoliosis. There
is narrowing and spurring of the anterior atlantodental joint.

Skull base and vertebrae: No acute fracture. No primary bone lesion
or focal pathologic process.

Soft tissues and spinal canal: No prevertebral fluid or swelling. No
visible canal hematoma. There is a low-density mass in the inferior
pole left lobe of the thyroid gland today measuring 2.3 cm,
previously 1.6 cm. Ultrasound follow-up recommended.

Disc levels: The discs are degenerated and there are bidirectional
osteophytes from C3-4 through C7-T1. There is only slight disc space
loss with small anterior osteophytes at C3-4. C2-3 is normal in
height.

Posterior disc osteophyte complexes from C4-5 through C7-T1 variably
encroaching on the thecal sac, with mild spinal canal stenosis and
mild deformity along the ventral cord surface due to disc osteophyte
complex encroachment.

No frank cord compression is seen. There is mild foraminal stenosis
at C5-6 and C6-7 but no critical foraminal stenosis.

Upper chest: Negative.

Other: None.
IMPRESSION: 1. Right forehead scalp hematoma. No acute intracranial CT findings,
depressed skull fractures or further interval changes.
2. Stable atrophy and small-vessel disease.
3. Degenerative changes of the cervical spine and mild
dextroscoliosis without evidence of fractures.
4. 2.3 cm left thyroid mass which was previously 1.6 cm. Thyroid
ultrasound recommended.
